# Patient Record
Sex: Female | Born: 1940
Health system: Southern US, Community
[De-identification: ages and names within clinical notes are randomized; demographics above are authoritative.]

## PROBLEM LIST (undated history)

## (undated) DIAGNOSIS — E119 Type 2 diabetes mellitus without complications: Secondary | ICD-10-CM

## (undated) DIAGNOSIS — M170 Bilateral primary osteoarthritis of knee: Secondary | ICD-10-CM

## (undated) DIAGNOSIS — G4730 Sleep apnea, unspecified: Secondary | ICD-10-CM

## (undated) DIAGNOSIS — T8859XA Other complications of anesthesia, initial encounter: Secondary | ICD-10-CM

## (undated) DIAGNOSIS — F32A Depression, unspecified: Secondary | ICD-10-CM

## (undated) DIAGNOSIS — Z801 Family history of malignant neoplasm of trachea, bronchus and lung: Secondary | ICD-10-CM

## (undated) DIAGNOSIS — R609 Edema, unspecified: Secondary | ICD-10-CM

## (undated) DIAGNOSIS — Z8051 Family history of malignant neoplasm of kidney: Secondary | ICD-10-CM

## (undated) DIAGNOSIS — Z800 Family history of malignant neoplasm of digestive organs: Secondary | ICD-10-CM

## (undated) DIAGNOSIS — N189 Chronic kidney disease, unspecified: Secondary | ICD-10-CM

## (undated) HISTORY — DX: Type 2 diabetes mellitus without complications: E11.9

## (undated) HISTORY — DX: Family history of malignant neoplasm of kidney: Z80.51

## (undated) HISTORY — PX: FOOT SURGERY: SHX648

## (undated) HISTORY — DX: Family history of malignant neoplasm of trachea, bronchus and lung: Z80.1

## (undated) HISTORY — PX: ABDOMINAL HYSTERECTOMY: SHX81

## (undated) HISTORY — DX: Family history of malignant neoplasm of digestive organs: Z80.0

## (undated) HISTORY — DX: Bilateral primary osteoarthritis of knee: M17.0

## (undated) HISTORY — DX: Edema, unspecified: R60.9

## (undated) HISTORY — PX: TUBAL LIGATION: SHX77

---

## 2000-01-18 ENCOUNTER — Emergency Department (HOSPITAL_COMMUNITY): Admission: EM | Admit: 2000-01-18 | Discharge: 2000-01-18 | Payer: Self-pay | Admitting: Emergency Medicine

## 2000-01-27 ENCOUNTER — Encounter: Admission: RE | Admit: 2000-01-27 | Discharge: 2000-01-27 | Payer: Self-pay | Admitting: Orthopedic Surgery

## 2000-01-27 ENCOUNTER — Encounter: Payer: Self-pay | Admitting: Orthopedic Surgery

## 2000-02-21 ENCOUNTER — Emergency Department (HOSPITAL_COMMUNITY): Admission: EM | Admit: 2000-02-21 | Discharge: 2000-02-21 | Payer: Self-pay | Admitting: *Deleted

## 2000-07-21 ENCOUNTER — Emergency Department (HOSPITAL_COMMUNITY): Admission: EM | Admit: 2000-07-21 | Discharge: 2000-07-22 | Payer: Self-pay | Admitting: Emergency Medicine

## 2004-10-10 ENCOUNTER — Emergency Department (HOSPITAL_COMMUNITY): Admission: EM | Admit: 2004-10-10 | Discharge: 2004-10-10 | Payer: Self-pay | Admitting: Family Medicine

## 2006-09-25 ENCOUNTER — Emergency Department (HOSPITAL_COMMUNITY): Admission: EM | Admit: 2006-09-25 | Discharge: 2006-09-25 | Payer: Self-pay | Admitting: Emergency Medicine

## 2007-03-27 IMAGING — CT CT HEAD W/O CM
1 of 2 series · 13 of 30 positions shown, 17 images · IV contrast (agent unspecified)
Comparison: None.

CLINICAL DATA: Headache and dizziness. Diabetes. 
 HEAD CT WITHOUT CONTRAST:
TECHNIQUE: Contiguous axial images were obtained from the base of the skull through the vertex according to standard protocol without contrast.

[Series 2: brain · axial · 0.47mm/px · z∈[+142,+263]mm · 13 of 28 slices shown, 17 images]
[im 2/28  brain]
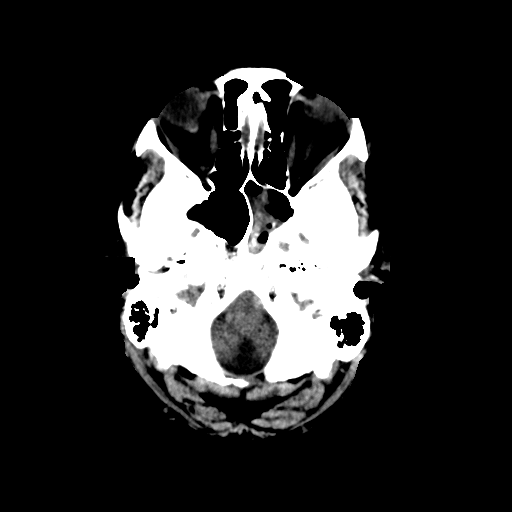
[im 2/28  bone]
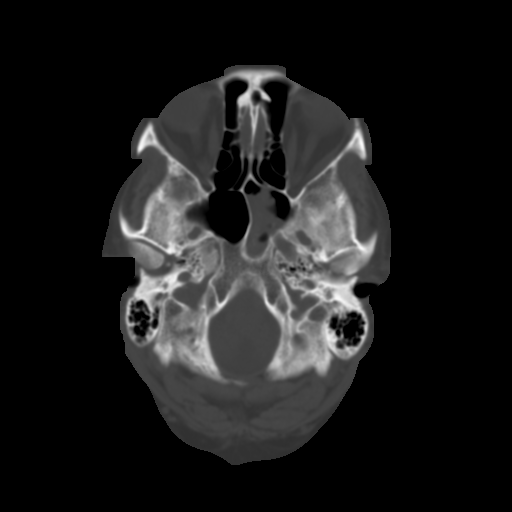
[im 4/28  brain]
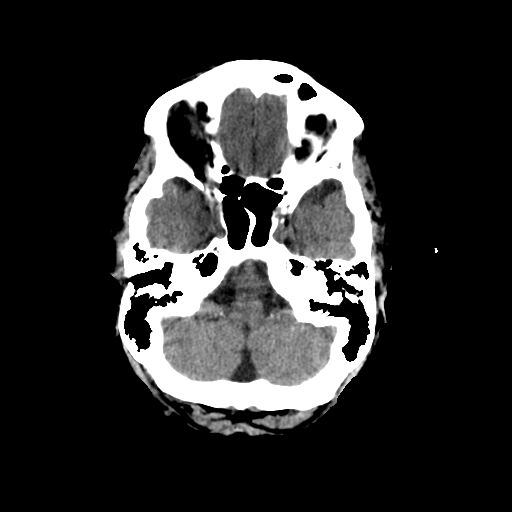
[im 6/28  brain]
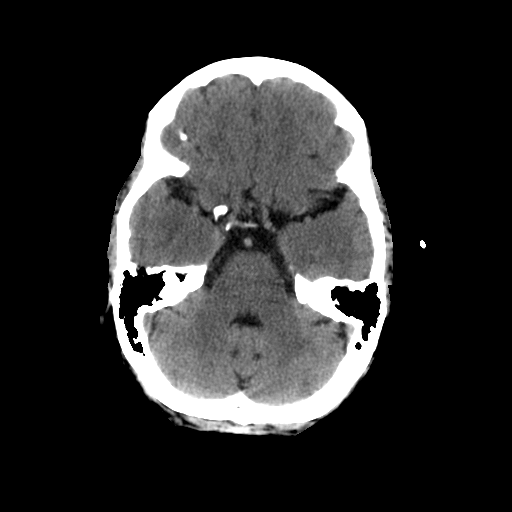
[im 8/28  brain]
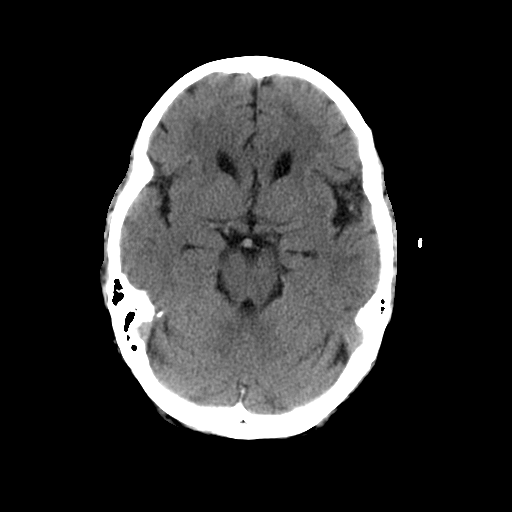
[im 10/28  brain]
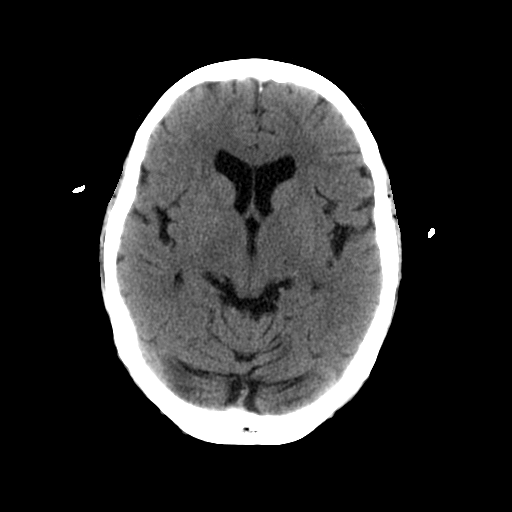
[im 10/28  bone]
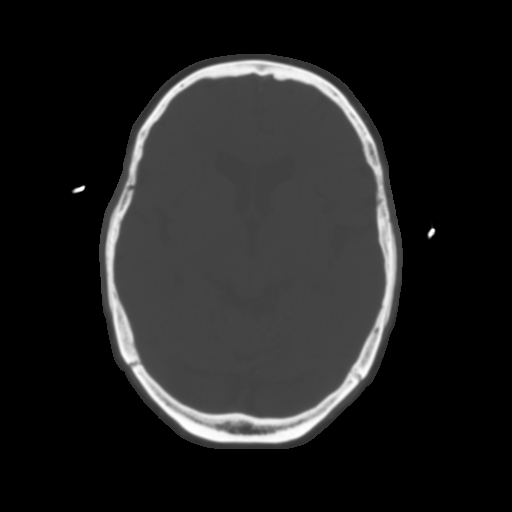
[im 12/28  brain]
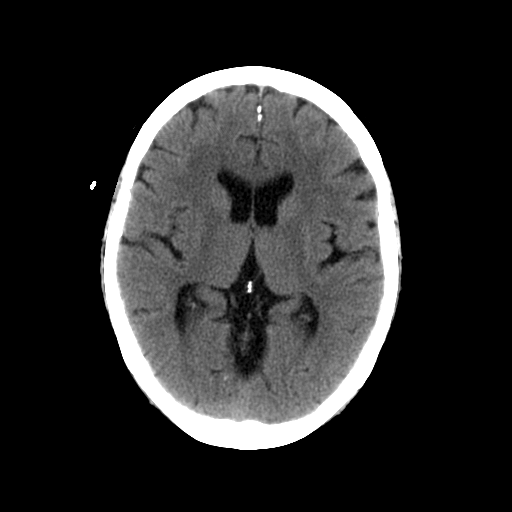
[im 14/28  brain]
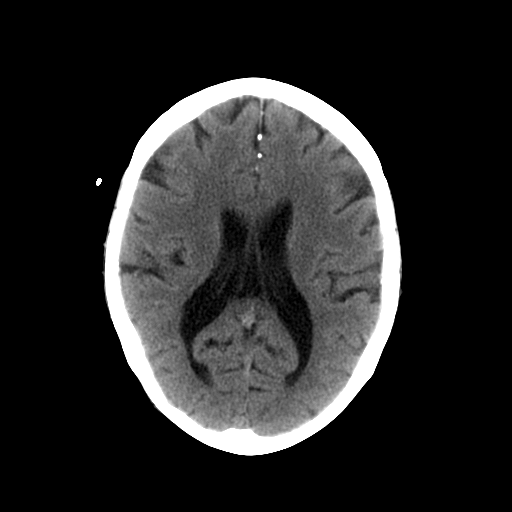
[im 16/28  brain]
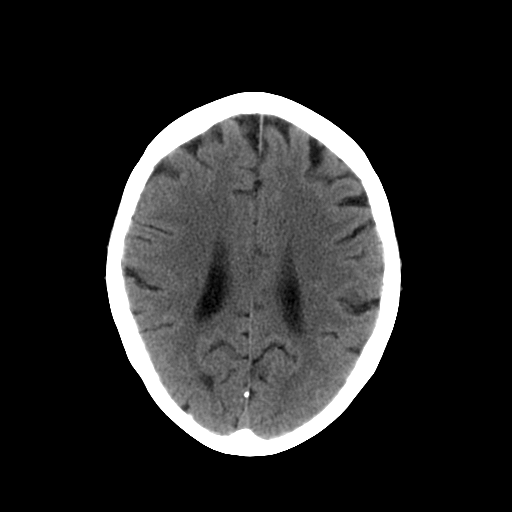
[im 18/28  brain]
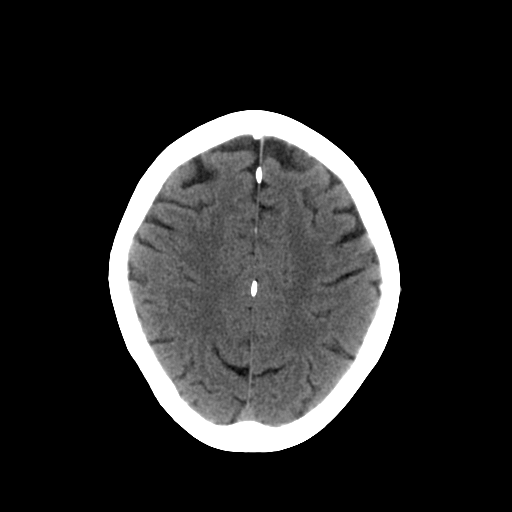
[im 18/28  bone]
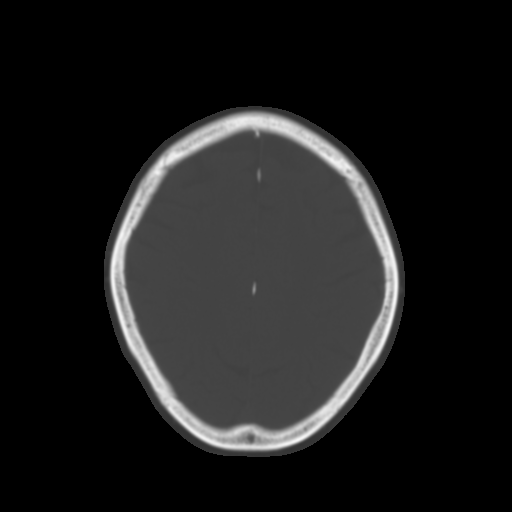
[im 20/28  brain]
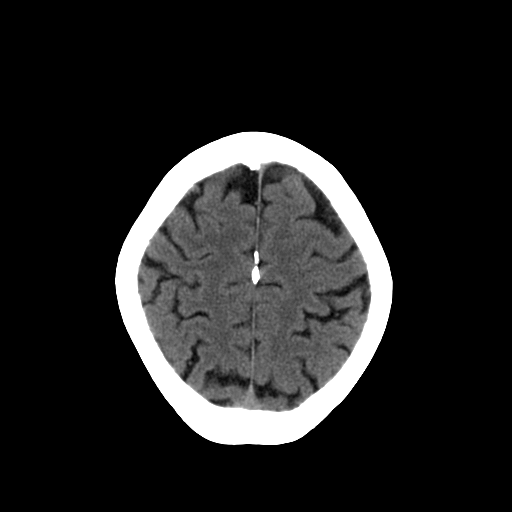
[im 22/28  brain]
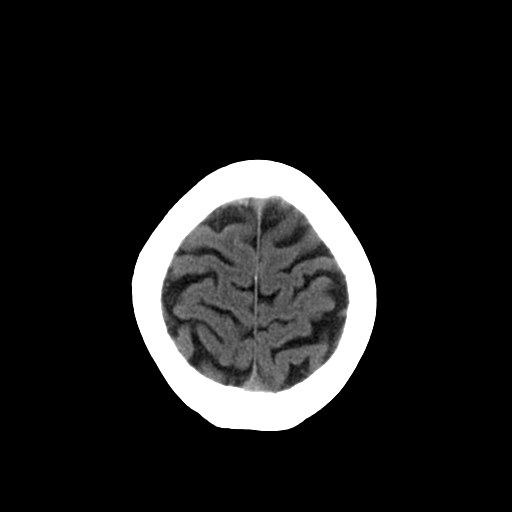
[im 24/28  brain]
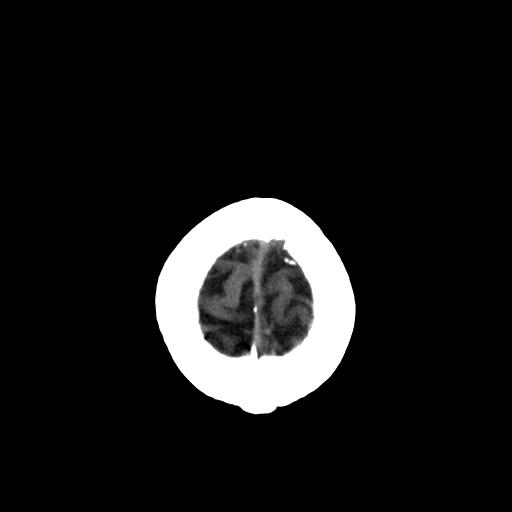
[im 26/28  brain]
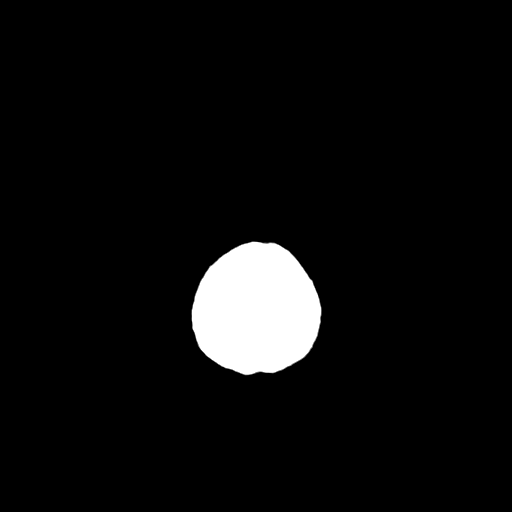
[im 26/28  bone]
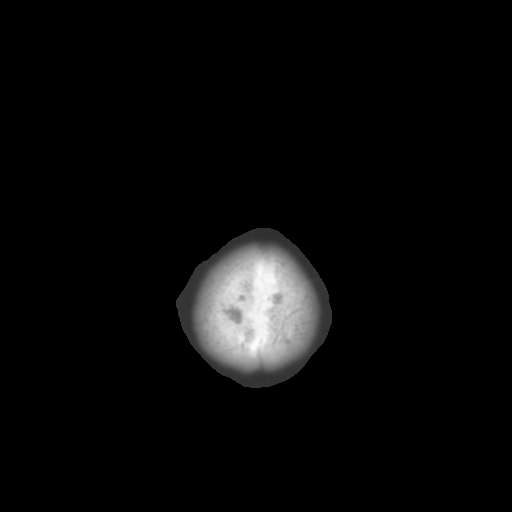

[13 of 30 positions shown; findings below may reference images not displayed]

FINDINGS: There is no evidence of intracranial hemorrhage, brain edema, or other signs of acute infarct.  There is no evidence of intracranial mass or mass effect.  No extraaxial abnormalities are seen. Atrophy is seen mainly involving the cerebellar vermis.  There is no evidence of hydrocephalus. No skull abnormality is identified. Partial opacification of the left sphenoid sinus is noted.
IMPRESSION: 1.  No acute intracranial findings. 
 2.  Chronic sphenoid sinusitis.

## 2008-07-02 ENCOUNTER — Ambulatory Visit: Payer: Self-pay | Admitting: Internal Medicine

## 2008-07-16 ENCOUNTER — Ambulatory Visit: Payer: Self-pay | Admitting: Internal Medicine

## 2009-05-10 DIAGNOSIS — M899 Disorder of bone, unspecified: Secondary | ICD-10-CM | POA: Insufficient documentation

## 2009-05-10 DIAGNOSIS — E1121 Type 2 diabetes mellitus with diabetic nephropathy: Secondary | ICD-10-CM | POA: Insufficient documentation

## 2009-05-10 DIAGNOSIS — K219 Gastro-esophageal reflux disease without esophagitis: Secondary | ICD-10-CM | POA: Insufficient documentation

## 2009-12-10 DIAGNOSIS — G4733 Obstructive sleep apnea (adult) (pediatric): Secondary | ICD-10-CM | POA: Insufficient documentation

## 2010-10-04 ENCOUNTER — Encounter: Payer: Self-pay | Admitting: Internal Medicine

## 2010-10-13 NOTE — Procedures (Signed)
Summary: Colonoscopy   Colonoscopy  Procedure date:  07/16/2008  Findings:      Location:  [facility].    Procedures Next Due Date:    Colonoscopy: 07/2018  Patient Name: Mckenzie Rogers, Mckenzie Rogers MRN:  Procedure Procedures: Colonoscopy CPT: 28413.  Personnel: Endoscopist: Virgen Belland L. Juanda Chance, MD.  Exam Location: Exam performed in Outpatient Clinic. Outpatient  Patient Consent: Procedure, Alternatives, Risks and Benefits discussed, consent obtained, from patient. Consent was obtained by the RN.  Indications  Average Risk Screening Routine.  History  Current Medications: Patient is not currently taking Coumadin.  Pre-Exam Physical: Performed Jul 16, 2008. Entire physical exam was normal.  Comments: Pt. history reviewed/updated, physical exam performed prior to initiation of sedation?yes Exam Exam: Extent of exam reached: Cecum, extent intended: Cecum.  The cecum was identified by appendiceal orifice and IC valve. Duration of exam: time in 8:39 min, out     4:52 min minutes. Colon retroflexion performed. Images taken. ASA Classification: I. Tolerance: good.  Monitoring: Pulse and BP monitoring, Oximetry used. Supplemental O2 given.  Colon Prep Used Miralax for colon prep. Prep results: good.  Sedation Meds: Patient assessed and found to be appropriate for moderate (conscious) sedation. Fentanyl 50 mcg. given IV. Versed 5 mg. given IV.  Findings - NORMAL EXAM: Cecum.  - NORMAL EXAM: Rectum.   Assessment Normal examination.  Comments: no polyps Events  Unplanned Interventions: No intervention was required.  Unplanned Events: There were no complications. Plans Patient Education: Patient given standard instructions for: Patient instructed to get routine colonoscopy every 10 years.  Comments: stool cards every 2-3 years Disposition: After procedure patient sent to recovery. After recovery patient sent home.    cc: Guerry Bruin, MD   This  report was created from the original endoscopy report, which was reviewed and signed by the above listed endoscopist.

## 2010-10-13 NOTE — Miscellaneous (Signed)
Summary: [facility] Previsit/prep  Clinical Lists Changes  Medications: Added new medication of DULCOLAX 5 MG  TBEC (BISACODYL) Day before procedure take 2 at 3pm and 2 at 8pm. - Signed Added new medication of METOCLOPRAMIDE HCL 10 MG  TABS (METOCLOPRAMIDE HCL) As per prep instructions. - Signed Added new medication of MIRALAX   POWD (POLYETHYLENE GLYCOL 3350) As per prep  instructions. - Signed Rx of DULCOLAX 5 MG  TBEC (BISACODYL) Day before procedure take 2 at 3pm and 2 at 8pm.;  #4 x 0;  Signed;  Entered by: Wyona Almas RN;  Authorized by: Hart Carwin MD;  Method used: Electronically to [facility] [address] [phone]*, [address] [place], [place]  [address], Ph: [phone], Fax: [phone] Rx of METOCLOPRAMIDE HCL 10 MG  TABS (METOCLOPRAMIDE HCL) As per prep instructions.;  #2 x 0;  Signed;  Entered by: Wyona Almas RN;  Authorized by: Hart Carwin MD;  Method used: Electronically to [facility] [address] [phone]*, [address], [place], [place]  [address], Ph: [phone], Fax: [phone] Rx of MIRALAX   POWD (POLYETHYLENE GLYCOL 3350) As per prep  instructions.;  #255gm x 0;  Signed;  Entered by: Wyona Almas RN;  Authorized by: Hart Carwin MD;  Method used: Electronically to [facility] [address] [phone]*, [address], [place], [place]  [address], Ph: [phone], Fax: [phone] Allergies: Added new allergy or adverse reaction of * TRANXENE Added new allergy or adverse reaction of PERCODAN Added new allergy or adverse reaction of * ORUDIS Observations: Added new observation of NKA: F (07/02/2008 9:04)    Prescriptions: MIRALAX   POWD (POLYETHYLENE GLYCOL 3350) As per prep  instructions.  #255gm x 0   Entered by:   Wyona Almas RN   Authorized by:   Hart Carwin MD   Signed by:   Wyona Almas RN on 07/02/2008   Method used:   Electronically to        [facility] [address] [phone]* (retail)       [address]  [address]       [place], [place]  [address]       Ph: [phone]       Fax: [phone]   RxID:   [id number] METOCLOPRAMIDE HCL 10 MG  TABS (METOCLOPRAMIDE HCL) As per prep instructions.  #2 x 0   Entered by:   Wyona Almas RN   Authorized by:   Hart Carwin MD   Signed by:   Wyona Almas RN on 07/02/2008   Method used:   Electronically to        [facility] [address] [phone]* (retail)       [address]       [address]       [place], [place]  [address]       Ph: [phone]       Fax: [phone]   RxID:   [id number] DULCOLAX 5 MG  TBEC (BISACODYL) Day before procedure take 2 at 3pm and 2 at 8pm.  #4 x 0   Entered by:   Wyona Almas RN   Authorized by:   Hart Carwin MD   Signed by:   Wyona Almas RN on 07/02/2008   Method used:   Electronically to        [facility] [address] [phone]* (retail)       [address]  [address]       [place], [place]  [address]       Ph: [phone]       Fax: [phone]   RxID:   [id number]   Appended Document: [facility] Previsit/prep    Clinical Lists Changes  Medications: Removed medication of METOCLOPRAMIDE HCL 10 MG  TABS (METOCLOPRAMIDE HCL) As per prep instructions.

## 2013-09-21 ENCOUNTER — Institutional Professional Consult (permissible substitution): Payer: Self-pay | Admitting: Neurology

## 2014-05-03 ENCOUNTER — Encounter: Payer: Self-pay | Admitting: Internal Medicine

## 2015-02-06 DIAGNOSIS — M1712 Unilateral primary osteoarthritis, left knee: Secondary | ICD-10-CM | POA: Diagnosis not present

## 2015-02-06 DIAGNOSIS — M25562 Pain in left knee: Secondary | ICD-10-CM | POA: Diagnosis not present

## 2015-02-06 DIAGNOSIS — M25561 Pain in right knee: Secondary | ICD-10-CM | POA: Diagnosis not present

## 2015-02-06 DIAGNOSIS — M1711 Unilateral primary osteoarthritis, right knee: Secondary | ICD-10-CM | POA: Diagnosis not present

## 2015-03-13 DIAGNOSIS — E669 Obesity, unspecified: Secondary | ICD-10-CM | POA: Diagnosis not present

## 2015-03-13 DIAGNOSIS — J302 Other seasonal allergic rhinitis: Secondary | ICD-10-CM | POA: Diagnosis not present

## 2015-03-13 DIAGNOSIS — M179 Osteoarthritis of knee, unspecified: Secondary | ICD-10-CM | POA: Diagnosis not present

## 2015-03-13 DIAGNOSIS — L219 Seborrheic dermatitis, unspecified: Secondary | ICD-10-CM | POA: Diagnosis not present

## 2015-03-13 DIAGNOSIS — E119 Type 2 diabetes mellitus without complications: Secondary | ICD-10-CM | POA: Diagnosis not present

## 2015-03-13 DIAGNOSIS — M2550 Pain in unspecified joint: Secondary | ICD-10-CM | POA: Diagnosis not present

## 2015-03-13 DIAGNOSIS — E559 Vitamin D deficiency, unspecified: Secondary | ICD-10-CM | POA: Diagnosis not present

## 2015-03-13 DIAGNOSIS — G4733 Obstructive sleep apnea (adult) (pediatric): Secondary | ICD-10-CM | POA: Diagnosis not present

## 2015-06-19 DIAGNOSIS — N390 Urinary tract infection, site not specified: Secondary | ICD-10-CM | POA: Diagnosis not present

## 2015-06-19 DIAGNOSIS — E119 Type 2 diabetes mellitus without complications: Secondary | ICD-10-CM | POA: Diagnosis not present

## 2015-06-19 DIAGNOSIS — E785 Hyperlipidemia, unspecified: Secondary | ICD-10-CM | POA: Diagnosis not present

## 2015-06-19 DIAGNOSIS — R8290 Unspecified abnormal findings in urine: Secondary | ICD-10-CM | POA: Diagnosis not present

## 2015-06-19 DIAGNOSIS — E559 Vitamin D deficiency, unspecified: Secondary | ICD-10-CM | POA: Diagnosis not present

## 2015-06-26 DIAGNOSIS — M179 Osteoarthritis of knee, unspecified: Secondary | ICD-10-CM | POA: Diagnosis not present

## 2015-06-26 DIAGNOSIS — G4733 Obstructive sleep apnea (adult) (pediatric): Secondary | ICD-10-CM | POA: Diagnosis not present

## 2015-06-26 DIAGNOSIS — J302 Other seasonal allergic rhinitis: Secondary | ICD-10-CM | POA: Diagnosis not present

## 2015-06-26 DIAGNOSIS — M2550 Pain in unspecified joint: Secondary | ICD-10-CM | POA: Diagnosis not present

## 2015-06-26 DIAGNOSIS — E669 Obesity, unspecified: Secondary | ICD-10-CM | POA: Diagnosis not present

## 2015-06-26 DIAGNOSIS — Z0000 Encounter for general adult medical examination without abnormal findings: Secondary | ICD-10-CM | POA: Diagnosis not present

## 2015-06-26 DIAGNOSIS — E559 Vitamin D deficiency, unspecified: Secondary | ICD-10-CM | POA: Diagnosis not present

## 2015-06-26 DIAGNOSIS — L219 Seborrheic dermatitis, unspecified: Secondary | ICD-10-CM | POA: Diagnosis not present

## 2016-01-14 DIAGNOSIS — E119 Type 2 diabetes mellitus without complications: Secondary | ICD-10-CM | POA: Diagnosis not present

## 2016-01-14 DIAGNOSIS — K219 Gastro-esophageal reflux disease without esophagitis: Secondary | ICD-10-CM | POA: Diagnosis not present

## 2016-01-14 DIAGNOSIS — E784 Other hyperlipidemia: Secondary | ICD-10-CM | POA: Diagnosis not present

## 2016-01-14 DIAGNOSIS — N3281 Overactive bladder: Secondary | ICD-10-CM | POA: Insufficient documentation

## 2016-01-14 DIAGNOSIS — G4733 Obstructive sleep apnea (adult) (pediatric): Secondary | ICD-10-CM | POA: Diagnosis not present

## 2016-01-14 DIAGNOSIS — D692 Other nonthrombocytopenic purpura: Secondary | ICD-10-CM | POA: Insufficient documentation

## 2016-01-14 DIAGNOSIS — E668 Other obesity: Secondary | ICD-10-CM | POA: Diagnosis not present

## 2016-01-14 DIAGNOSIS — Z6836 Body mass index (BMI) 36.0-36.9, adult: Secondary | ICD-10-CM | POA: Diagnosis not present

## 2016-01-14 DIAGNOSIS — J302 Other seasonal allergic rhinitis: Secondary | ICD-10-CM | POA: Diagnosis not present

## 2016-04-14 DIAGNOSIS — Z111 Encounter for screening for respiratory tuberculosis: Secondary | ICD-10-CM | POA: Diagnosis not present

## 2016-06-22 DIAGNOSIS — E784 Other hyperlipidemia: Secondary | ICD-10-CM | POA: Diagnosis not present

## 2016-06-22 DIAGNOSIS — E119 Type 2 diabetes mellitus without complications: Secondary | ICD-10-CM | POA: Diagnosis not present

## 2016-06-22 DIAGNOSIS — E559 Vitamin D deficiency, unspecified: Secondary | ICD-10-CM | POA: Diagnosis not present

## 2016-07-22 ENCOUNTER — Ambulatory Visit (INDEPENDENT_AMBULATORY_CARE_PROVIDER_SITE_OTHER): Payer: Self-pay | Admitting: Orthopedic Surgery

## 2016-07-22 ENCOUNTER — Encounter (INDEPENDENT_AMBULATORY_CARE_PROVIDER_SITE_OTHER): Payer: Self-pay

## 2016-09-16 DIAGNOSIS — Z0000 Encounter for general adult medical examination without abnormal findings: Secondary | ICD-10-CM | POA: Diagnosis not present

## 2016-09-16 DIAGNOSIS — D692 Other nonthrombocytopenic purpura: Secondary | ICD-10-CM | POA: Diagnosis not present

## 2016-09-16 DIAGNOSIS — E119 Type 2 diabetes mellitus without complications: Secondary | ICD-10-CM | POA: Diagnosis not present

## 2016-09-16 DIAGNOSIS — N3281 Overactive bladder: Secondary | ICD-10-CM | POA: Diagnosis not present

## 2016-09-16 DIAGNOSIS — Z23 Encounter for immunization: Secondary | ICD-10-CM | POA: Diagnosis not present

## 2016-09-16 DIAGNOSIS — M859 Disorder of bone density and structure, unspecified: Secondary | ICD-10-CM | POA: Diagnosis not present

## 2016-09-16 DIAGNOSIS — E668 Other obesity: Secondary | ICD-10-CM | POA: Diagnosis not present

## 2016-09-16 DIAGNOSIS — E559 Vitamin D deficiency, unspecified: Secondary | ICD-10-CM | POA: Diagnosis not present

## 2016-09-16 DIAGNOSIS — Z6836 Body mass index (BMI) 36.0-36.9, adult: Secondary | ICD-10-CM | POA: Diagnosis not present

## 2016-09-16 DIAGNOSIS — M2550 Pain in unspecified joint: Secondary | ICD-10-CM | POA: Diagnosis not present

## 2016-09-16 DIAGNOSIS — M179 Osteoarthritis of knee, unspecified: Secondary | ICD-10-CM | POA: Diagnosis not present

## 2016-09-27 ENCOUNTER — Ambulatory Visit: Payer: Self-pay | Admitting: Podiatry

## 2017-06-09 DIAGNOSIS — H43811 Vitreous degeneration, right eye: Secondary | ICD-10-CM | POA: Diagnosis not present

## 2017-06-09 DIAGNOSIS — H2513 Age-related nuclear cataract, bilateral: Secondary | ICD-10-CM | POA: Diagnosis not present

## 2017-06-09 DIAGNOSIS — E119 Type 2 diabetes mellitus without complications: Secondary | ICD-10-CM | POA: Diagnosis not present

## 2017-09-22 ENCOUNTER — Ambulatory Visit (INDEPENDENT_AMBULATORY_CARE_PROVIDER_SITE_OTHER): Payer: Medicare HMO | Admitting: Orthopedic Surgery

## 2017-09-26 ENCOUNTER — Ambulatory Visit (INDEPENDENT_AMBULATORY_CARE_PROVIDER_SITE_OTHER): Payer: Medicare HMO | Admitting: Orthopedic Surgery

## 2017-10-24 ENCOUNTER — Encounter (INDEPENDENT_AMBULATORY_CARE_PROVIDER_SITE_OTHER): Payer: Self-pay | Admitting: Orthopedic Surgery

## 2017-10-24 ENCOUNTER — Ambulatory Visit (INDEPENDENT_AMBULATORY_CARE_PROVIDER_SITE_OTHER): Payer: Self-pay

## 2017-10-24 ENCOUNTER — Ambulatory Visit (INDEPENDENT_AMBULATORY_CARE_PROVIDER_SITE_OTHER): Payer: Medicare HMO | Admitting: Orthopedic Surgery

## 2017-10-24 ENCOUNTER — Ambulatory Visit (INDEPENDENT_AMBULATORY_CARE_PROVIDER_SITE_OTHER): Payer: Medicare HMO

## 2017-10-24 DIAGNOSIS — M1712 Unilateral primary osteoarthritis, left knee: Secondary | ICD-10-CM | POA: Diagnosis not present

## 2017-10-24 DIAGNOSIS — M1711 Unilateral primary osteoarthritis, right knee: Secondary | ICD-10-CM

## 2017-10-24 DIAGNOSIS — M170 Bilateral primary osteoarthritis of knee: Secondary | ICD-10-CM | POA: Diagnosis not present

## 2017-10-24 MED ORDER — METHYLPREDNISOLONE ACETATE 40 MG/ML IJ SUSP
40.0000 mg | INTRAMUSCULAR | Status: AC | PRN
  Administered 2017-10-24: 40 mg via INTRA_ARTICULAR

## 2017-10-24 MED ORDER — LIDOCAINE HCL 1 % IJ SOLN
5.0000 mL | INTRAMUSCULAR | Status: AC | PRN
  Administered 2017-10-24: 5 mL

## 2017-10-24 MED ORDER — BUPIVACAINE HCL 0.25 % IJ SOLN
4.0000 mL | INTRAMUSCULAR | Status: AC | PRN
  Administered 2017-10-24: 4 mL via INTRA_ARTICULAR

## 2017-10-24 NOTE — Progress Notes (Signed)
Office Visit Note   Patient: Mckenzie Rogers           Date of Birth: 1941-09-02           MRN: [id number] Visit Date: 10/24/2017 Requested by: Gaspar Garbeisovec, Mckenzie Rogers, Mckenzie Rogers [address] [place], [place] [id number] PCP: Mckenzie Rogers, Mckenzie Kohichard Rogers, Mckenzie Rogers  Subjective: Chief Complaint  Patient presents with  . Left Knee - Pain  . Right Knee - Pain    HPI: Patient presents for evaluation of left knee pain.  She reports both knees hurting but the left is worse than the right.'s been flared up for 3 months.  She had similar problem 3 years ago.  She was prescribed meloxicam which is helped.  She denies any interval history of injury or surgery.  On the knees.              ROS: All systems reviewed are negative as they relate to the chief complaint within the history of present illness.  Patient denies  fevers or chills.   Assessment & Plan: Visit Diagnoses:  1. Primary osteoarthritis of both knees     Plan: Impression is bilateral knee arthritis with primarily valgus component on that left knee.  Plan is aspiration and injection of that left knee today.  I think that is going to help her temporarily.  She may be a candidate for knee replacement in the future.  Continue with nonweightbearing quad strengthening exercises.  Follow-up with me as needed.  Follow-Up Instructions: Return if symptoms worsen or fail to improve.   Orders:  Orders Placed This Encounter  Procedures  . XR Knee 1-2 Views Right  . XR Knee 1-2 Views Left   No orders of the defined types were placed in this encounter.     Procedures: Large Joint Inj: L knee on 10/24/2017 5:21 PM Indications: diagnostic evaluation, joint swelling and pain Details: 18 G 1.5 in needle, superolateral approach  Arthrogram: No  Medications: 5 mL lidocaine 1 %; 40 mg methylPREDNISolone acetate 40 MG/ML; 4 mL bupivacaine 0.25 % Aspirate: 5 mL yellow Outcome: tolerated well, no immediate complications Procedure, treatment alternatives, risks and  benefits explained, specific risks discussed. Consent was given by the patient. Immediately prior to procedure a time out was called to verify the correct patient, procedure, equipment, support staff and site/side marked as required. Patient was prepped and draped in the usual sterile fashion.       Clinical Data: No additional findings.  Objective: Vital Signs: There were no vitals taken for this visit.  Physical Exam:   Constitutional: Patient appears well-developed HEENT:  Head: Normocephalic Eyes:EOM are normal Neck: Normal range of motion Cardiovascular: Normal rate Pulmonary/chest: Effort normal Neurologic: Patient is alert Skin: Skin is warm Psychiatric: Patient has normal mood and affect    Ortho Exam: Orthopedic exam demonstrates valgus alignment left lower extremity pedal pulses palpable extensor mechanism is intact no groin pain with internal/external rotation of the leg patient has full extension and flexion to about 120.  Lateral greater than medial joint line tenderness.  Extensor mechanism is intact.  Specialty Comments:  No specialty comments available.  Imaging: Xr Knee 1-2 Views Left  Result Date: 10/24/2017 AP lateral left knee reviewed.  Tricompartmental arthritis is present worse in the lateral compartment.  Spurring is present in this area.  Bone-on-bone changes are noted.  Posterior spurring also present.  Patella well centered over the femur  Xr Knee 1-2 Views Right  Result Date: 10/24/2017 AP lateral right knee  reviewed.  End-stage tricompartmental arthritis is present worse on the medial side.  Spurring is noted.  There is no fracture or dislocation.  Patella centered over the femur    PMFS History: There are no active problems to display for this patient.  History reviewed. No pertinent past medical history.  History reviewed. No pertinent family history.  History reviewed. No pertinent surgical history. Social History   Occupational  History  . Not on file  Tobacco Use  . Smoking status: Never Smoker  . Smokeless tobacco: Never Used  Substance and Sexual Activity  . Alcohol use: Not on file  . Drug use: Not on file  . Sexual activity: Not on file

## 2017-11-25 DIAGNOSIS — R82998 Other abnormal findings in urine: Secondary | ICD-10-CM | POA: Diagnosis not present

## 2017-11-25 DIAGNOSIS — E119 Type 2 diabetes mellitus without complications: Secondary | ICD-10-CM | POA: Diagnosis not present

## 2017-11-28 DIAGNOSIS — E7849 Other hyperlipidemia: Secondary | ICD-10-CM | POA: Diagnosis not present

## 2017-11-28 DIAGNOSIS — E119 Type 2 diabetes mellitus without complications: Secondary | ICD-10-CM | POA: Diagnosis not present

## 2017-11-28 DIAGNOSIS — E559 Vitamin D deficiency, unspecified: Secondary | ICD-10-CM | POA: Diagnosis not present

## 2017-12-02 DIAGNOSIS — E1122 Type 2 diabetes mellitus with diabetic chronic kidney disease: Secondary | ICD-10-CM | POA: Diagnosis not present

## 2017-12-02 DIAGNOSIS — M859 Disorder of bone density and structure, unspecified: Secondary | ICD-10-CM | POA: Diagnosis not present

## 2017-12-02 DIAGNOSIS — M179 Osteoarthritis of knee, unspecified: Secondary | ICD-10-CM | POA: Diagnosis not present

## 2017-12-02 DIAGNOSIS — Z0000 Encounter for general adult medical examination without abnormal findings: Secondary | ICD-10-CM | POA: Diagnosis not present

## 2017-12-02 DIAGNOSIS — E7849 Other hyperlipidemia: Secondary | ICD-10-CM | POA: Diagnosis not present

## 2017-12-02 DIAGNOSIS — R808 Other proteinuria: Secondary | ICD-10-CM | POA: Diagnosis not present

## 2017-12-02 DIAGNOSIS — N390 Urinary tract infection, site not specified: Secondary | ICD-10-CM | POA: Diagnosis not present

## 2017-12-02 DIAGNOSIS — G4733 Obstructive sleep apnea (adult) (pediatric): Secondary | ICD-10-CM | POA: Diagnosis not present

## 2017-12-02 DIAGNOSIS — E559 Vitamin D deficiency, unspecified: Secondary | ICD-10-CM | POA: Diagnosis not present

## 2018-01-18 ENCOUNTER — Ambulatory Visit (INDEPENDENT_AMBULATORY_CARE_PROVIDER_SITE_OTHER): Payer: Medicare HMO | Admitting: Orthopedic Surgery

## 2018-01-24 ENCOUNTER — Encounter (HOSPITAL_COMMUNITY): Payer: Self-pay | Admitting: Emergency Medicine

## 2018-01-24 ENCOUNTER — Emergency Department (HOSPITAL_COMMUNITY): Payer: Medicare HMO

## 2018-01-24 ENCOUNTER — Other Ambulatory Visit: Payer: Self-pay

## 2018-01-24 ENCOUNTER — Emergency Department (HOSPITAL_COMMUNITY)
Admission: EM | Admit: 2018-01-24 | Discharge: 2018-01-24 | Disposition: A | Discharge status: Home / Self Care | Payer: Medicare HMO | Attending: Emergency Medicine | Admitting: Emergency Medicine

## 2018-01-24 DIAGNOSIS — J209 Acute bronchitis, unspecified: Secondary | ICD-10-CM | POA: Diagnosis not present

## 2018-01-24 DIAGNOSIS — Z79899 Other long term (current) drug therapy: Secondary | ICD-10-CM | POA: Insufficient documentation

## 2018-01-24 DIAGNOSIS — R05 Cough: Secondary | ICD-10-CM | POA: Diagnosis not present

## 2018-01-24 DIAGNOSIS — J3489 Other specified disorders of nose and nasal sinuses: Secondary | ICD-10-CM | POA: Diagnosis not present

## 2018-01-24 MED ORDER — BENZONATATE 100 MG PO CAPS: 100.0000 mg | ORAL_CAPSULE | Freq: Three times a day (TID) | ORAL | 0 refills | Status: DC

## 2018-01-24 MED ORDER — ALBUTEROL SULFATE HFA 108 (90 BASE) MCG/ACT IN AERS: 1.0000 | INHALATION_SPRAY | Freq: Four times a day (QID) | RESPIRATORY_TRACT | 0 refills | Status: AC | PRN

## 2018-01-24 NOTE — ED Triage Notes (Signed)
Pt reports productive cough with white sputum since Sunday, also endorses sore throat, denies cp or sob.

## 2018-01-24 NOTE — ED Provider Notes (Signed)
[facility] EMERGENCY DEPARTMENT Provider Note   CSN: [id number] Arrival date & time: 01/24/18  1039     History   Chief Complaint Chief Complaint  Patient presents with  . Cough    HPI Mckenzie Rogers is a 77 y.o. female.  HPI   Patient is a 77 year old female with history of hyperlipidemia who presents the ED today to be evaluated for a productive cough which is been present for the last 4 days.  Patient also reports associated sore throat, rhinorrhea.  Denies bilateral ear pain or nasal congestion.  Denies shortness of breath.  She does report some midline chest heaviness when she coughs and for short period of time after she coughs.  Denies that she has any pain currently.  States the pain is worse when she presses on it.  It does not radiate it is not associated with exertion, diaphoresis, nausea.  States that is a mild discomfort.  She also reports some abdominal muscle pain when she coughs.  No abdominal pain when she does not cough.  Had one episode of vomiting yesterday after taking Mucinex.  Has had no continued episodes of nausea no vomiting.  No fevers.  No urinary symptoms.  Denies that she has been around anyone with similar symptoms.  He has no history of tobacco use. No calf pain, swelling, or redness.  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  History reviewed. No pertinent past medical history.  There are no active problems to display for this patient.   History reviewed. No pertinent surgical history.   OB History   None      Home Medications    Prior to Admission medications   Medication Sig Start Date End Date Taking? Authorizing Provider  albuterol (PROVENTIL HFA;VENTOLIN HFA) 108 (90 Base) MCG/ACT inhaler Inhale 1-2 puffs into the lungs every 6 (six) hours as needed for wheezing or shortness of breath. 01/24/18   Luca Dyar S, PA-C  benzonatate (TESSALON) 100 MG  capsule Take 1 capsule (100 mg total) by mouth every 8 (eight) hours. 01/24/18   Trenice Mesa S, PA-C  meloxicam (MOBIC) 15 MG tablet Take 15 mg by mouth daily.    [provider]    Family History No family history on file.  Social History Social History   Tobacco Use  . Smoking status: Never Smoker  . Smokeless tobacco: Never Used  Substance Use Topics  . Alcohol use: Not on file  . Drug use: Not on file     Allergies   Oxycodone-aspirin   Review of Systems Review of Systems  Constitutional: Negative for fever.  HENT: Positive for rhinorrhea and sore throat. Negative for congestion, ear pain and trouble swallowing.   Eyes: Negative for pain and visual disturbance.  Respiratory: Positive for cough. Negative for shortness of breath.   Cardiovascular: Negative for leg swelling.       Midline chest heaviness  Gastrointestinal: Negative for abdominal pain, constipation, diarrhea, nausea and vomiting.  Genitourinary: Negative for dysuria and hematuria.  Musculoskeletal: Negative for back pain.  Skin: Negative for rash.  Neurological: Negative for dizziness, weakness, light-headedness and numbness.  All other systems reviewed and are negative.   Physical Exam Updated Vital Signs BP 128/72   Pulse 76   Temp 98.8 F (37.1 C) (Oral)   Resp 18   LMP  (LMP Unknown)   SpO2 100%   Physical Exam  Constitutional: She appears well-developed and well-nourished. No distress.  HENT:  Head: Normocephalic and atraumatic.  bilat TMs without erythema or effusion. No pharyngeal erythema. No tonsillar exudate or edema. Tolerating secretions. No evidence of PTA.  Eyes: Pupils are equal, round, and reactive to light. Conjunctivae and EOM are normal.  Neck: Neck supple.  Cardiovascular: Normal rate, regular rhythm, normal heart sounds and intact distal pulses.  No murmur heard. Pulmonary/Chest: Effort normal and breath sounds normal. No stridor. No respiratory distress. She  has no wheezes. She has no rales.  Midline lower chest ttp which reproduces pain. No crepitus.   Abdominal: Soft. Bowel sounds are normal. She exhibits no distension. There is no tenderness. There is no guarding.  Musculoskeletal: She exhibits no edema.  No calf erythema, swelling. Mild ttp bilaterally that is diffuse and along gastrocnemius muscles. No asymmetric ttp.  Lymphadenopathy:    She has no cervical adenopathy.  Neurological: She is alert.  Skin: Skin is warm and dry. Capillary refill takes less than 2 seconds.  Psychiatric: She has a normal mood and affect.  Nursing note and vitals reviewed.  ED Treatments / Results  Labs (all labs ordered are listed, but only abnormal results are displayed) Labs Reviewed - No data to display  EKG EKG Interpretation  Date/Time:  Tuesday Jan 24 2018 13:08:36 EDT Ventricular Rate:  68 PR Interval:    QRS Duration: 75 QT Interval:  362 QTC Calculation: 385 R Axis:   35 Text Interpretation:  Sinus rhythm Borderline short PR interval Low voltage, precordial leads no significant change since 2008 Confirmed by Pricilla Loveless [phone]) on 01/24/2018 1:20:52 PM   Radiology Dg Chest 2 View  Result Date: 01/24/2018 CLINICAL DATA:  Two days of productive cough and chest pressure like discomfort; no known cardiac issues. EXAM: CHEST - 2 VIEW COMPARISON:  Chest x-ray of October 10, 2004 FINDINGS: The lungs are adequately inflated. There is no focal infiltrate. The interstitial markings are mildly prominent though stable. There is no pleural effusion. The heart and pulmonary vascularity are normal. The mediastinum is normal in width. There is chronic dextrocurvature of the thoracolumbar spine. IMPRESSION: Mild chronic bronchitic changes, stable. No acute cardiopulmonary abnormality. Electronically Signed   By: David  [place] M.D.   On: 01/24/2018 11:40    Procedures Procedures (including critical care time)  Medications Ordered in ED Medications - No  data to display   Initial Impression / Assessment and Plan / ED Course  I have reviewed the triage vital signs and the nursing notes.  Pertinent labs & imaging results that were available during my care of the patient were reviewed by me and considered in my medical decision making (see chart for details).    Discussed pt presentation and exam findings with Dr. Criss Alvine, who recommended ordering and ECG. He reviewed ECG and is in agreement that further cardiac workup is not necessary as patients sxs seem consistent with chest wall pain secondary to cough/bronchitis for the last 4 days.   Final Clinical Impressions(s) / ED Diagnoses   Final diagnoses:  Acute bronchitis, unspecified organism   Patient presented with URI symptoms x4 days.  Had describes some chest pressure with cough that is not currently present.  Her vital signs are stable and she is afebrile at this time.  Her cardiopulmonary exam is within normal limits.  She is in no acute distress and nontoxic-appearing.  Pt CXR negative for acute infiltrate. ECG without ischemic changes. Consistent with prior. NSR. Normal heart rate.  Do not suspect ACS or AAA.  Doubt PE.  Patients symptoms are consistent with viral bronchitis. Discussed that antibiotics are not indicated for viral infections. Pt will be discharged with symptomatic treatment.  Verbalizes understanding and is agreeable with plan. Pt is hemodynamically stable & in NAD prior to dc.  States she has a PCP and she will follow-up this week for reevaluation.  Strict return precautions were discussed the patient does agree to follow-up if she experiences any new or worsening signs or symptoms.  All questions answered and patient agrees with the plan for discharge.  ED Discharge Orders        Ordered    albuterol (PROVENTIL HFA;VENTOLIN HFA) 108 (90 Base) MCG/ACT inhaler  Every 6 hours PRN     01/24/18 1328    benzonatate (TESSALON) 100 MG capsule  Every 8 hours     01/24/18 1328         Shenee Wignall S, PA-C 01/24/18 1334    Pricilla Loveless, MD 01/25/18 [phone]

## 2018-01-24 NOTE — Discharge Instructions (Signed)
You are given a prescription for an albuterol inhaler which she can use every 4-6 hours as needed for shortness of breath or cough.  You are also given a prescription for Tessalon which is a cough medication.  You may take 1 capsule every 8 hours as needed for your cough.    I have prescribed a new medication for you today. It is important that when you pick the prescription up you discuss the potential interactions of this medication with other medications you are taking, including over the counter medications, with the pharmacists.   This new medication has potential side effects. Be sure to contact your primary care provider or return to the emergency department if you are experiencing new symptoms that you are unable to tolerate after starting the medication. You need to receive medical evaluation immediately if you start to experience blistering of the skin, rash, swelling, or difficulty breathing as these signs could indicate a more serious medication side effect.   Over the next several days you should rest as much as possible, and drink more fluids than usual. Liquids will help thin and loosen mucus so you can cough it up. Liquids will also help prevent dehydration. Using a cool mist humidifier or a vaporizer to increase air moisture in your home can also make it easier for you to breathe and help decrease your cough.  To help soothe a sore throat gargle with warm salt water.  Make salt water by dissolving  teaspoon salt in 1 cup warm water. You may also use throat lozenges and over the counter sore throat spray.  Please follow up with your primary care provider within 2-3 days for re-evaluation of your symptoms. Please return to the emergency department for any persistent fevers, worsening sore throat/hoarse voice, inability to swallow, persistent vomiting, chest pain, shortness of breath, coughing up blood, or any new or worsening symptoms.

## 2018-02-01 DIAGNOSIS — Z6834 Body mass index (BMI) 34.0-34.9, adult: Secondary | ICD-10-CM | POA: Diagnosis not present

## 2018-02-01 DIAGNOSIS — J209 Acute bronchitis, unspecified: Secondary | ICD-10-CM | POA: Diagnosis not present

## 2018-02-01 DIAGNOSIS — J302 Other seasonal allergic rhinitis: Secondary | ICD-10-CM | POA: Diagnosis not present

## 2018-02-15 ENCOUNTER — Ambulatory Visit (INDEPENDENT_AMBULATORY_CARE_PROVIDER_SITE_OTHER): Payer: Medicare HMO | Admitting: Orthopedic Surgery

## 2018-02-23 ENCOUNTER — Ambulatory Visit (INDEPENDENT_AMBULATORY_CARE_PROVIDER_SITE_OTHER): Payer: Medicare HMO | Admitting: Orthopedic Surgery

## 2018-04-10 ENCOUNTER — Ambulatory Visit (INDEPENDENT_AMBULATORY_CARE_PROVIDER_SITE_OTHER): Payer: Medicare HMO | Admitting: Orthopedic Surgery

## 2018-06-08 DIAGNOSIS — Z23 Encounter for immunization: Secondary | ICD-10-CM | POA: Diagnosis not present

## 2018-07-26 IMAGING — CR DG CHEST 2V
2 series · 2 of 2 positions shown · non-contrast
Comparison: Chest x-ray of October 10, 2004

CLINICAL DATA: Two days of productive cough and chest pressure like
discomfort; no known cardiac issues.

EXAM:
CHEST - 2 VIEW

[chest pa]
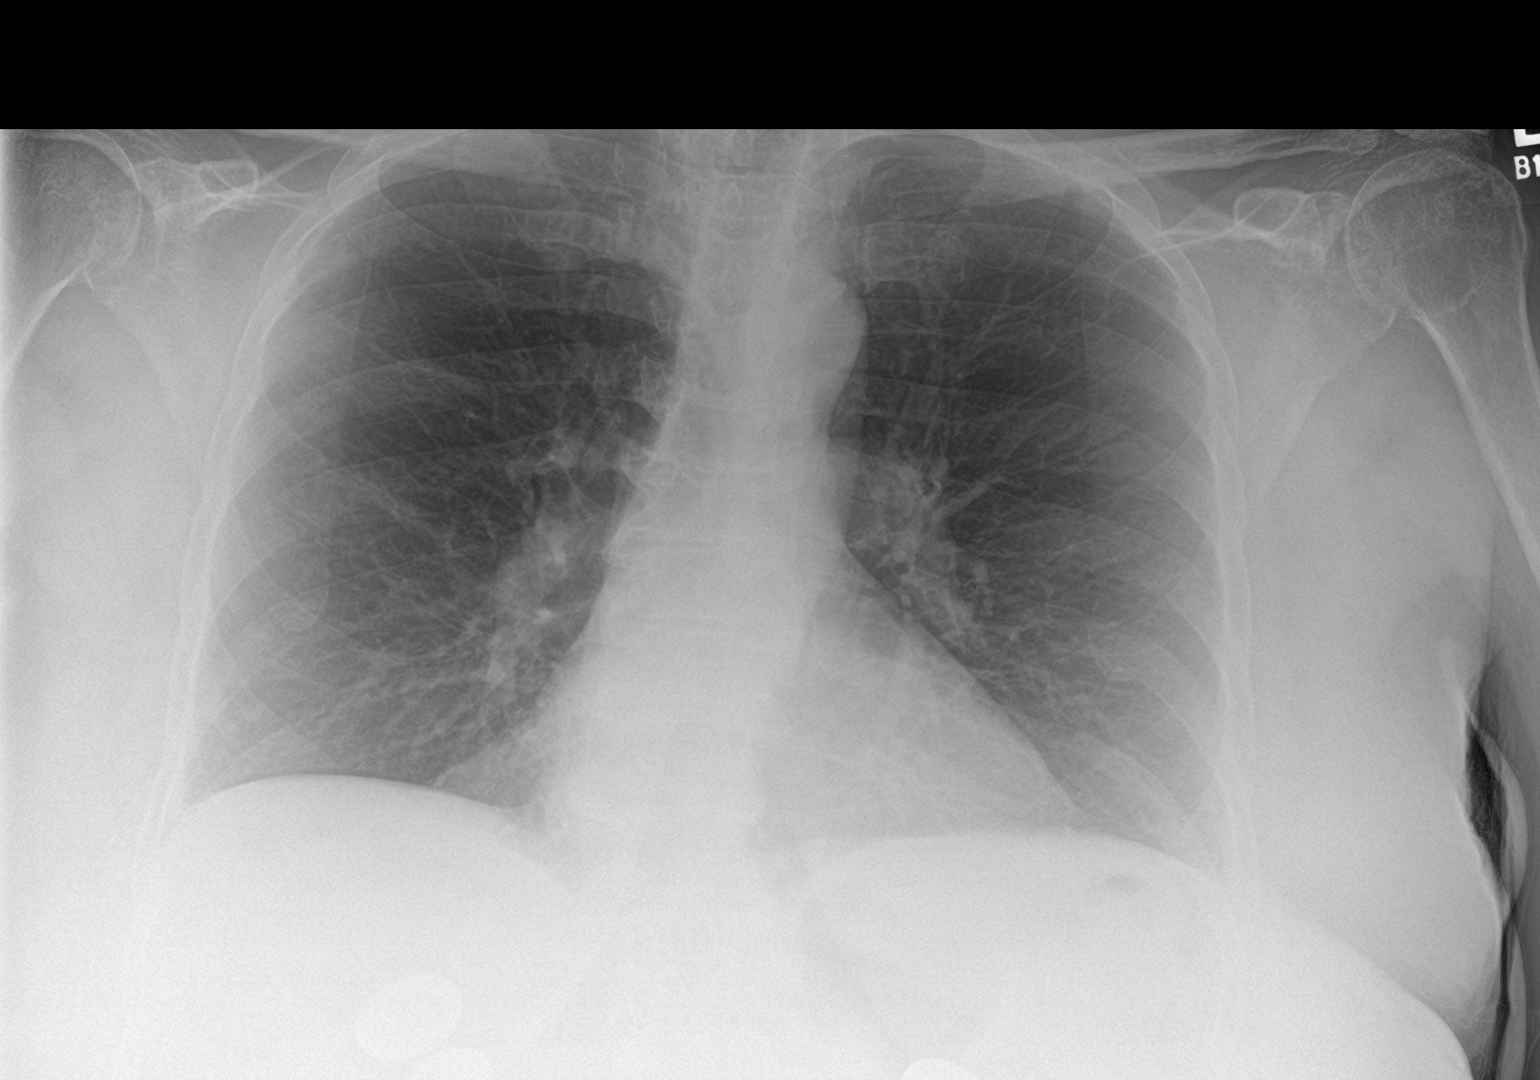

[chest lat]
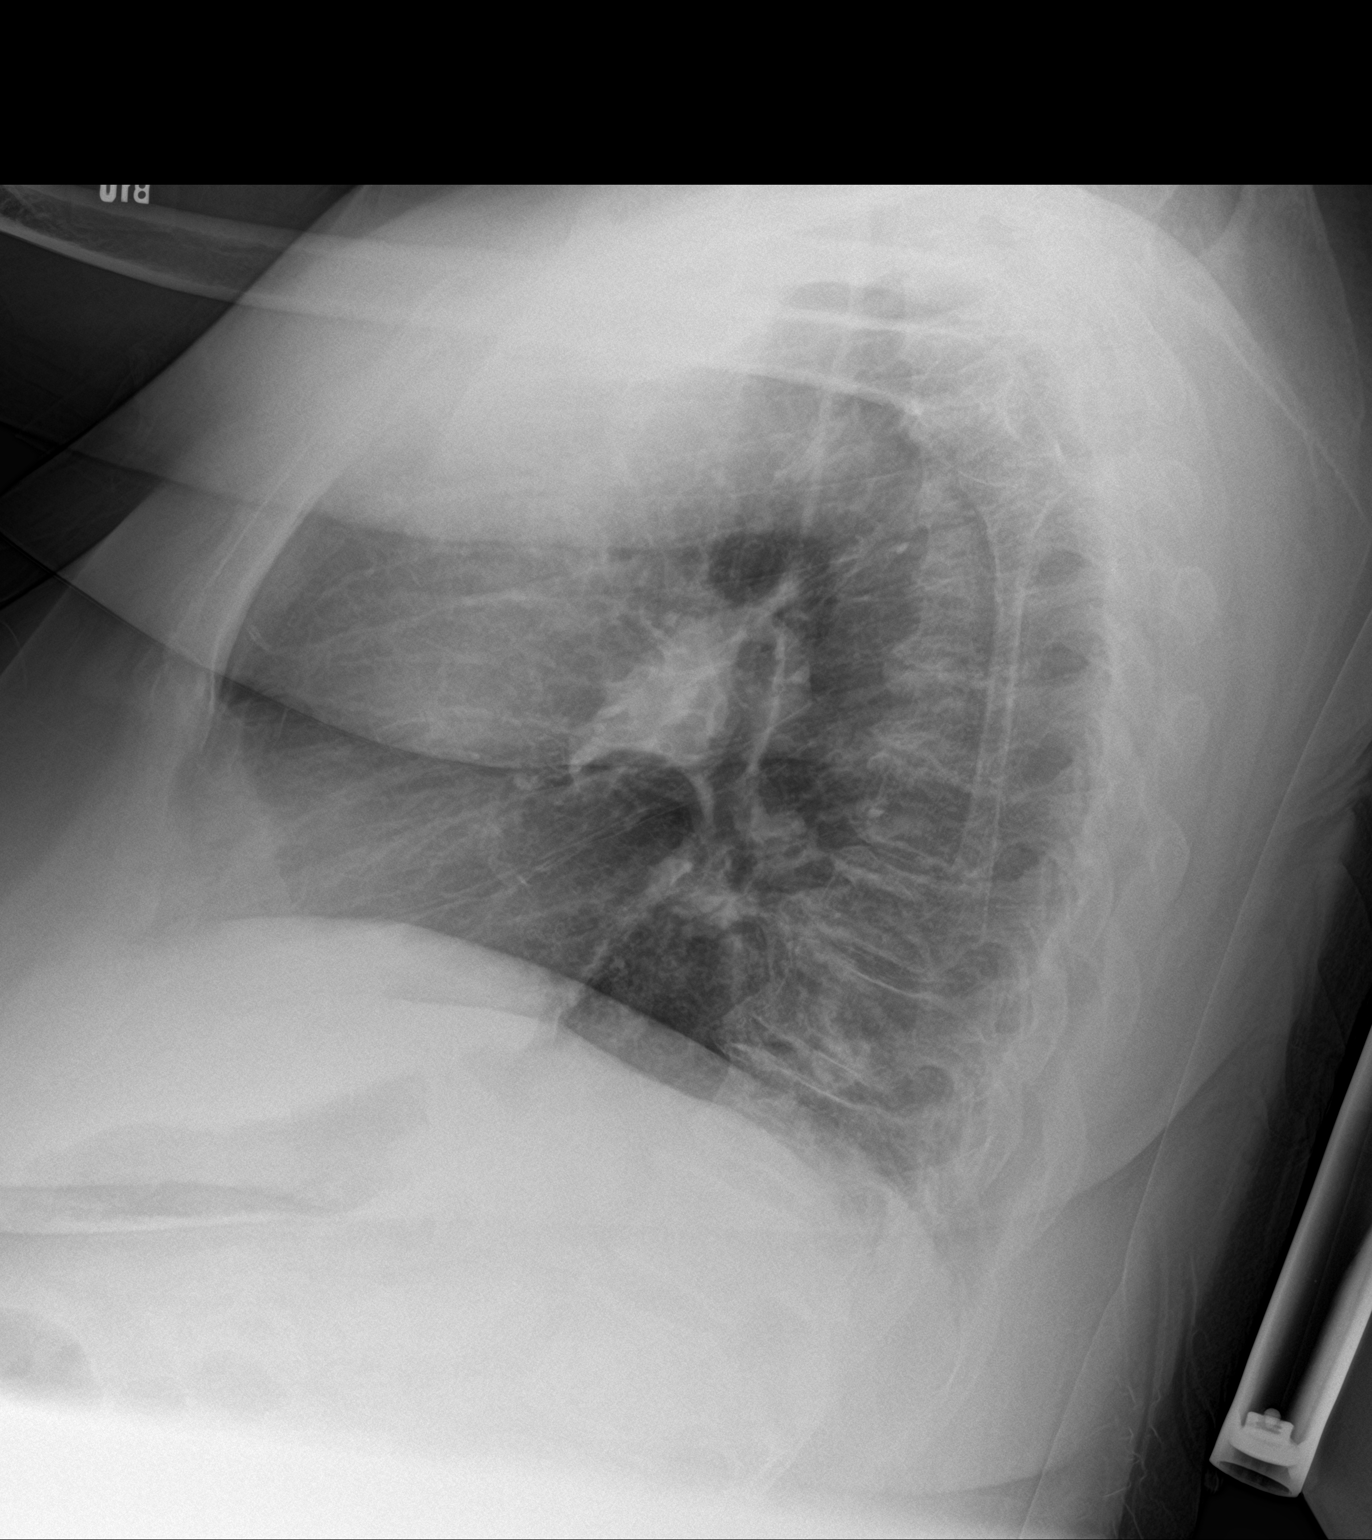

[2 of 2 positions shown; findings below may reference images not displayed]

FINDINGS: The lungs are adequately inflated. There is no focal infiltrate. The
interstitial markings are mildly prominent though stable. There is
no pleural effusion. The heart and pulmonary vascularity are normal.
The mediastinum is normal in width. There is chronic dextrocurvature
of the thoracolumbar spine.
IMPRESSION: Mild chronic bronchitic changes, stable. No acute cardiopulmonary
abnormality.

## 2018-08-16 ENCOUNTER — Ambulatory Visit (INDEPENDENT_AMBULATORY_CARE_PROVIDER_SITE_OTHER): Payer: Self-pay

## 2018-08-16 ENCOUNTER — Ambulatory Visit (INDEPENDENT_AMBULATORY_CARE_PROVIDER_SITE_OTHER): Payer: Medicare HMO | Admitting: Orthopedic Surgery

## 2018-08-16 ENCOUNTER — Encounter (INDEPENDENT_AMBULATORY_CARE_PROVIDER_SITE_OTHER): Payer: Self-pay | Admitting: Orthopedic Surgery

## 2018-08-16 DIAGNOSIS — M170 Bilateral primary osteoarthritis of knee: Secondary | ICD-10-CM

## 2018-08-16 MED ORDER — BUPIVACAINE HCL 0.25 % IJ SOLN
4.0000 mL | INTRAMUSCULAR | Status: AC | PRN
  Administered 2018-08-16: 4 mL via INTRA_ARTICULAR

## 2018-08-16 MED ORDER — LIDOCAINE HCL 1 % IJ SOLN
5.0000 mL | INTRAMUSCULAR | Status: AC | PRN
  Administered 2018-08-16: 5 mL

## 2018-08-16 MED ORDER — METHYLPREDNISOLONE ACETATE 40 MG/ML IJ SUSP
40.0000 mg | INTRAMUSCULAR | Status: AC | PRN
  Administered 2018-08-16: 40 mg via INTRA_ARTICULAR

## 2018-08-16 NOTE — Progress Notes (Signed)
Office Visit Note   Patient: Mckenzie Rogers           Date of Birth: September 18, 1940           MRN: [id number] Visit Date: 08/16/2018 Requested by: Gaspar Garbeisovec, Richard W, MD [address] [place], [place] [id number] PCP: Wylene Simmerisovec, Adelfa Kohichard W, MD  Subjective: Chief Complaint  Patient presents with  . Right Knee - Follow-up  . Left Knee - Follow-up  . Right Hand - Pain  . Left Hand - Pain    HPI: Patient presents for evaluation of bilateral knee pain.  She reports bilateral knee pain left worse than right.  Stiffness in both knees.  Takes Mobic.  Her sugar from sitting to standing.  Denies any mechanical symptoms.              ROS: All systems reviewed are negative as they relate to the chief complaint within the history of present illness.  Patient denies  fevers or chills.   Assessment & Plan: Visit Diagnoses:  1. Primary osteoarthritis of both knees     Plan: Impression is bilateral knee pain with left knee worse than right arthritis.  We will inject the knee today.  She has a known history of knee arthritis.  She may need knee replacement sometime in the future but will let her decide on that.  Follow-up with me as needed.  Follow-Up Instructions: Return if symptoms worsen or fail to improve.   Orders:  No orders of the defined types were placed in this encounter.  No orders of the defined types were placed in this encounter.     Procedures: Large Joint Inj: L knee on 08/16/2018 10:32 PM Indications: diagnostic evaluation, joint swelling and pain Details: 18 G 1.5 in needle, superolateral approach  Arthrogram: No  Medications: 5 mL lidocaine 1 %; 40 mg methylPREDNISolone acetate 40 MG/ML; 4 mL bupivacaine 0.25 % Outcome: tolerated well, no immediate complications Procedure, treatment alternatives, risks and benefits explained, specific risks discussed. Consent was given by the patient. Immediately prior to procedure a time out was called to verify the correct patient,  procedure, equipment, support staff and site/side marked as required. Patient was prepped and draped in the usual sterile fashion.       Clinical Data: No additional findings.  Objective: Vital Signs: There were no vitals taken for this visit.  Physical Exam:   Constitutional: Patient appears well-developed HEENT:  Head: Normocephalic Eyes:EOM are normal Neck: Normal range of motion Cardiovascular: Normal rate Pulmonary/chest: Effort normal Neurologic: Patient is alert Skin: Skin is warm Psychiatric: Patient has normal mood and affect    Ortho Exam: Ortho exam demonstrates full active and passive range of motion of the ankles and hips.  Left knee demonstrates mild effusion with patellofemoral crepitus and lateral greater than medial joint line tenderness.  Pedal pulses palpable.  No masses lymphadenopathy or skin changes noted in that knee region.  Specialty Comments:  No specialty comments available.  Imaging: No results found.   PMFS History: There are no active problems to display for this patient.  History reviewed. No pertinent past medical history.  History reviewed. No pertinent family history.  History reviewed. No pertinent surgical history. Social History   Occupational History  . Not on file  Tobacco Use  . Smoking status: Never Smoker  . Smokeless tobacco: Never Used  Substance and Sexual Activity  . Alcohol use: Not on file  . Drug use: Not on file  . Sexual activity: Not  on file

## 2018-08-17 DIAGNOSIS — E7849 Other hyperlipidemia: Secondary | ICD-10-CM | POA: Diagnosis not present

## 2018-08-17 DIAGNOSIS — N182 Chronic kidney disease, stage 2 (mild): Secondary | ICD-10-CM | POA: Diagnosis not present

## 2018-08-17 DIAGNOSIS — E1122 Type 2 diabetes mellitus with diabetic chronic kidney disease: Secondary | ICD-10-CM | POA: Diagnosis not present

## 2018-08-17 DIAGNOSIS — G4733 Obstructive sleep apnea (adult) (pediatric): Secondary | ICD-10-CM | POA: Diagnosis not present

## 2018-08-17 DIAGNOSIS — K219 Gastro-esophageal reflux disease without esophagitis: Secondary | ICD-10-CM | POA: Diagnosis not present

## 2018-08-17 DIAGNOSIS — M859 Disorder of bone density and structure, unspecified: Secondary | ICD-10-CM | POA: Diagnosis not present

## 2018-08-17 DIAGNOSIS — R808 Other proteinuria: Secondary | ICD-10-CM | POA: Diagnosis not present

## 2018-08-17 DIAGNOSIS — M179 Osteoarthritis of knee, unspecified: Secondary | ICD-10-CM | POA: Diagnosis not present

## 2018-08-17 DIAGNOSIS — N3281 Overactive bladder: Secondary | ICD-10-CM | POA: Diagnosis not present

## 2018-08-24 DIAGNOSIS — E1122 Type 2 diabetes mellitus with diabetic chronic kidney disease: Secondary | ICD-10-CM | POA: Diagnosis not present

## 2018-08-24 DIAGNOSIS — N183 Chronic kidney disease, stage 3 (moderate): Secondary | ICD-10-CM | POA: Diagnosis not present

## 2018-08-24 DIAGNOSIS — Z794 Long term (current) use of insulin: Secondary | ICD-10-CM | POA: Diagnosis not present

## 2018-08-24 DIAGNOSIS — I129 Hypertensive chronic kidney disease with stage 1 through stage 4 chronic kidney disease, or unspecified chronic kidney disease: Secondary | ICD-10-CM | POA: Insufficient documentation

## 2018-08-24 DIAGNOSIS — Z6834 Body mass index (BMI) 34.0-34.9, adult: Secondary | ICD-10-CM | POA: Diagnosis not present

## 2018-08-24 DIAGNOSIS — I10 Essential (primary) hypertension: Secondary | ICD-10-CM | POA: Diagnosis not present

## 2018-09-03 ENCOUNTER — Encounter: Payer: Self-pay | Admitting: Gastroenterology

## 2018-09-07 ENCOUNTER — Ambulatory Visit: Payer: Medicare HMO | Admitting: Podiatry

## 2018-09-14 ENCOUNTER — Ambulatory Visit: Payer: Medicare HMO | Admitting: Podiatry

## 2018-09-28 ENCOUNTER — Encounter: Payer: Self-pay | Admitting: Podiatry

## 2018-09-28 ENCOUNTER — Ambulatory Visit: Payer: Medicare HMO | Admitting: Podiatry

## 2018-09-28 VITALS — BP 132/62

## 2018-09-28 DIAGNOSIS — M79675 Pain in left toe(s): Secondary | ICD-10-CM

## 2018-09-28 DIAGNOSIS — B351 Tinea unguium: Secondary | ICD-10-CM

## 2018-09-28 DIAGNOSIS — M79674 Pain in right toe(s): Secondary | ICD-10-CM | POA: Diagnosis not present

## 2018-09-28 DIAGNOSIS — E119 Type 2 diabetes mellitus without complications: Secondary | ICD-10-CM | POA: Diagnosis not present

## 2018-09-28 NOTE — Patient Instructions (Addendum)
Diabetes Mellitus and Foot Care Foot care is an important part of your health, especially when you have diabetes. Diabetes may cause you to have problems because of poor blood flow (circulation) to your feet and legs, which can cause your skin to:  Become thinner and drier.  Break more easily.  Heal more slowly.  Peel and crack. You may also have nerve damage (neuropathy) in your legs and feet, causing decreased feeling in them. This means that you may not notice minor injuries to your feet that could lead to more serious problems. Noticing and addressing any potential problems early is the best way to prevent future foot problems. How to care for your feet Foot hygiene  Wash your feet daily with warm water and mild soap. Do not use hot water. Then, pat your feet and the areas between your toes until they are completely dry. Do not soak your feet as this can dry your skin.  Trim your toenails straight across. Do not dig under them or around the cuticle. File the edges of your nails with an emery board or nail file.  Apply a moisturizing lotion or petroleum jelly to the skin on your feet and to dry, brittle toenails. Use lotion that does not contain alcohol and is unscented. Do not apply lotion between your toes. Shoes and socks  Wear clean socks or stockings every day. Make sure they are not too tight. Do not wear knee-high stockings since they may decrease blood flow to your legs.  Wear shoes that fit properly and have enough cushioning. Always look in your shoes before you put them on to be sure there are no objects inside.  To break in new shoes, wear them for just a few hours a day. This prevents injuries on your feet. Wounds, scrapes, corns, and calluses  Check your feet daily for blisters, cuts, bruises, sores, and redness. If you cannot see the bottom of your feet, use a mirror or ask someone for help.  Do not cut corns or calluses or try to remove them with medicine.  If you  find a minor scrape, cut, or break in the skin on your feet, keep it and the skin around it clean and dry. You may clean these areas with mild soap and water. Do not clean the area with peroxide, alcohol, or iodine.  If you have a wound, scrape, corn, or callus on your foot, look at it several times a day to make sure it is healing and not infected. Check for: ? Redness, swelling, or pain. ? Fluid or blood. ? Warmth. ? Pus or a bad smell. General instructions  Do not cross your legs. This may decrease blood flow to your feet.  Do not use heating pads or hot water bottles on your feet. They may burn your skin. If you have lost feeling in your feet or legs, you may not know this is happening until it is too late.  Protect your feet from hot and cold by wearing shoes, such as at the beach or on hot pavement.  Schedule a complete foot exam at least once a year (annually) or more often if you have foot problems. If you have foot problems, report any cuts, sores, or bruises to your health care provider immediately. Contact a health care provider if:  You have a medical condition that increases your risk of infection and you have any cuts, sores, or bruises on your feet.  You have an injury that is not   healing.  You have redness on your legs or feet.  You feel burning or tingling in your legs or feet.  You have pain or cramps in your legs and feet.  Your legs or feet are numb.  Your feet always feel cold.  You have pain around a toenail. Get help right away if:  You have a wound, scrape, corn, or callus on your foot and: ? You have pain, swelling, or redness that gets worse. ? You have fluid or blood coming from the wound, scrape, corn, or callus. ? Your wound, scrape, corn, or callus feels warm to the touch. ? You have pus or a bad smell coming from the wound, scrape, corn, or callus. ? You have a fever. ? You have a red line going up your leg. Summary  Check your feet every day  for cuts, sores, red spots, swelling, and blisters.  Moisturize feet and legs daily.  Wear shoes that fit properly and have enough cushioning.  If you have foot problems, report any cuts, sores, or bruises to your health care provider immediately.  Schedule a complete foot exam at least once a year (annually) or more often if you have foot problems. This information is not intended to replace advice given to you by your health care provider. Make sure you discuss any questions you have with your health care provider. Document Released: 08/27/2000 Document Revised: 10/12/2017 Document Reviewed: 10/01/2016 Elsevier Interactive Patient Education  2019 Elsevier Inc[employer]  Corns and Calluses Corns are small areas of thickened skin that occur on the top, sides, or tip of a toe. They contain a cone-shaped core with a point that can press on a nerve below. This causes pain.  Calluses are areas of thickened skin that can occur anywhere on the body, including the hands, fingers, palms, soles of the feet, and heels. Calluses are usually larger than corns. What are the causes? Corns and calluses are caused by rubbing (friction) or pressure, such as from shoes that are too tight or do not fit properly. What increases the risk? Corns are more likely to develop in people who have misshapen toes (toe deformities), such as hammer toes. Calluses can occur with friction to any area of the skin. They are more likely to develop in people who:  Work with their hands.  Wear shoes that fit poorly, are too tight, or are high-heeled.  Have toe deformities. What are the signs or symptoms? Symptoms of a corn or callus include:  A hard growth on the skin.  Pain or tenderness under the skin.  Redness and swelling.  Increased discomfort while wearing tight-fitting shoes, if your feet are affected. If a corn or callus becomes infected, symptoms may include:  Redness and swelling that gets  worse.  Pain.  Fluid, blood, or pus draining from the corn or callus. How is this diagnosed? Corns and calluses may be diagnosed based on your symptoms, your medical history, and a physical exam. How is this treated? Treatment for corns and calluses may include:  Removing the cause of the friction or pressure. This may involve: ? Changing your shoes. ? Wearing shoe inserts (orthotics) or other protective layers in your shoes, such as a corn pad. ? Wearing gloves.  Applying medicine to the skin (topical medicine) to help soften skin in the hardened, thickened areas.  Removing layers of dead skin with a file to reduce the size of the corn or callus.  Removing the corn or callus with a scalpel   or laser.  Taking antibiotic medicines, if your corn or callus is infected.  Having surgery, if a toe deformity is the cause. Follow these instructions at home:   Take over-the-counter and prescription medicines only as told by your health care provider.  If you were prescribed an antibiotic, take it as told by your health care provider. Do not stop taking it even if your condition starts to improve.  Wear shoes that fit well. Avoid wearing high-heeled shoes and shoes that are too tight or too loose.  Wear any padding, protective layers, gloves, or orthotics as told by your health care provider.  Soak your hands or feet and then use a file or pumice stone to soften your corn or callus. Do this as told by your health care provider.  Check your corn or callus every day for symptoms of infection. Contact a health care provider if you:  Notice that your symptoms do not improve with treatment.  Have redness or swelling that gets worse.  Notice that your corn or callus becomes painful.  Have fluid, blood, or pus coming from your corn or callus.  Have new symptoms. Summary  Corns are small areas of thickened skin that occur on the top, sides, or tip of a toe.  Calluses are areas of  thickened skin that can occur anywhere on the body, including the hands, fingers, palms, and soles of the feet. Calluses are usually larger than corns.  Corns and calluses are caused by rubbing (friction) or pressure, such as from shoes that are too tight or do not fit properly.  Treatment may include wearing any padding, protective layers, gloves, or orthotics as told by your health care provider. This information is not intended to replace advice given to you by your health care provider. Make sure you discuss any questions you have with your health care provider. Document Released: 06/05/2004 Document Revised: 07/13/2017 Document Reviewed: 07/13/2017 Elsevier Interactive Patient Education  2019 Elsevier Inc.  

## 2018-09-28 NOTE — Progress Notes (Signed)
Subjective: Mckenzie Rogers presents today referred by Gaspar Garbe, MD with diabetes and cc of painful, discolored, thick toenails which interfere with daily activities.  Pain is aggravated when wearing enclosed shoe gear.  Patient denies any history of foot wounds. Denies any numbness, tingling or burning in feet.  Past Medical History:  Diagnosis Date  . Diabetes (HCC)   . Edema   . Osteoarthritis of both knees     There are no active problems to display for this patient.   Past Surgical History:  Procedure Laterality Date  . FOOT SURGERY Left   . TUBAL LIGATION       Current Outpatient Medications:  .  ACCU-CHEK AVIVA PLUS test strip, , Disp: , Rfl:  .  fluconazole (DIFLUCAN) 150 MG tablet, , Disp: , Rfl:  .  Lancets (ONETOUCH DELICA PLUS LANCET30G) MISC, , Disp: , Rfl:  .  meloxicam (MOBIC) 15 MG tablet, Take 15 mg by mouth daily., Disp: , Rfl:  .  OZEMPIC, 1 MG/DOSE, 2 MG/1.5ML SOPN, , Disp: , Rfl:  .  TOVIAZ 4 MG TB24 tablet, , Disp: , Rfl:  .  valsartan (DIOVAN) 80 MG tablet, , Disp: , Rfl:  .  albuterol (PROVENTIL HFA;VENTOLIN HFA) 108 (90 Base) MCG/ACT inhaler, Inhale 1-2 puffs into the lungs every 6 (six) hours as needed for wheezing or shortness of breath. (Patient not taking: Reported on 09/28/2018), Disp: 1 Inhaler, Rfl: 0 .  benzonatate (TESSALON) 100 MG capsule, Take 1 capsule (100 mg total) by mouth every 8 (eight) hours. (Patient not taking: Reported on 09/28/2018), Disp: 9 capsule, Rfl: 0  Allergies  Allergen Reactions  . Oxycodone-Aspirin     REACTION: tongue swells    Social History   Occupational History  . Not on file  Tobacco Use  . Smoking status: Never Smoker  . Smokeless tobacco: Never Used  Substance and Sexual Activity  . Alcohol use: Not on file  . Drug use: Not on file  . Sexual activity: Not on file    Family History  Problem Relation Age of Onset  . Diabetes Maternal Aunt   . CVA Daughter   . Kidney failure Son       There is no immunization history on file for this patient.   Review of systems: Positive Findings in bold print.  Constitutional:  chills, fatigue, fever, sweats, weight change Communication: Nurse, learning disability, sign Presenter, broadcasting, hand writing, iPad/Android device Head: headaches, head injury Eyes: changes in vision, eye pain, glaucoma, cataracts, macular degeneration, diplopia, glare,  light sensitivity, eyeglasses or contacts, blindness Ears nose mouth throat: Hard of hearing, ringing in ears, deaf, sign language,  vertigo,   nosebleeds,  rhinitis,  cold sores, snoring, swollen glands Cardiovascular: HTN, edema, arrhythmia, pacemaker in place, defibrillator in place,  chest pain/tightness, chronic anticoagulation, blood clot, heart failure Peripheral Vascular: leg cramps, varicose veins, blood clots, lymphedema Respiratory:  difficulty breathing, denies congestion, SOB, wheezing, cough, emphysema Gastrointestinal: change in appetite or weight, abdominal pain, constipation, diarrhea, nausea, vomiting, vomiting blood, change in bowel habits, abdominal pain, jaundice, rectal bleeding, hemorrhoids, Genitourinary:  nocturia,  pain on urination,  blood in urine, Foley catheter, urinary urgency Musculoskeletal: uses mobility aid,  cramping, stiff joints, painful joints, decreased joint motion, fractures, OA, gout Skin: +changes in toenails, color change, dryness, itching, mole changes,  rash  Neurological: headaches, numbness in feet, paresthesias in feet, burning in feet, fainting,  seizures, change in speech. denies headaches, memory problems/poor historian, cerebral palsy, weakness, paralysis Endocrine:  diabetes, hypothyroidism, hyperthyroidism,  goiter, dry mouth, flushing, heat intolerance,  cold intolerance,  excessive thirst, denies polyuria,  nocturia Hematological:  easy bleeding, excessive bleeding, easy bruising, enlarged lymph nodes, on long term blood thinner, history of past  transusions Allergy/immunological:  hives, eczema, frequent infections, multiple drug allergies, seasonal allergies, transplant recipient Psychiatric:  anxiety, depression, mood disorder, suicidal ideations, hallucinations   Objective: Vascular Examination: Capillary refill time immediate x 10 digits Dorsalis pedis 2/4 b/l  Posterior tibial pulses 2/4 b/l No digital hair x 10 digits Skin temperature gradient WNL b/l  Dermatological Examination: Skin with normal turgor, texture and tone b/l  Toenails 1-5 b/l discolored, thick, dystrophic with subungual debris and pain with palpation to nailbeds due to thickness of nails.  Musculoskeletal: Muscle strength 5/5 to all LE muscle groups  Neurological: Sensation intact with 10 gram monofilament Vibratory sensation intact.  Assessment: 1. Painful onychomycosis toenails 1-5 b/l  2. NIDDM  Plan: 1. Discussed diabetic foot care principles. Literature dispensed on today. 2. Toenails 1-5 b/l were debrided in length and girth without iatrogenic bleeding. 3. Patient to continue soft, supportive shoe gear 4. Patient to report any pedal injuries to medical professional immediately. 5. Follow up 3 months. Patient/POA to call should there be a concern in the interim.

## 2018-10-10 DIAGNOSIS — Z111 Encounter for screening for respiratory tuberculosis: Secondary | ICD-10-CM | POA: Diagnosis not present

## 2018-10-15 ENCOUNTER — Encounter: Payer: Self-pay | Admitting: Podiatry

## 2018-11-01 DIAGNOSIS — Z794 Long term (current) use of insulin: Secondary | ICD-10-CM | POA: Insufficient documentation

## 2018-11-01 DIAGNOSIS — I10 Essential (primary) hypertension: Secondary | ICD-10-CM | POA: Diagnosis not present

## 2018-11-01 DIAGNOSIS — N183 Chronic kidney disease, stage 3 (moderate): Secondary | ICD-10-CM | POA: Diagnosis not present

## 2018-11-01 DIAGNOSIS — E1122 Type 2 diabetes mellitus with diabetic chronic kidney disease: Secondary | ICD-10-CM | POA: Diagnosis not present

## 2018-11-07 DIAGNOSIS — Z111 Encounter for screening for respiratory tuberculosis: Secondary | ICD-10-CM | POA: Diagnosis not present

## 2018-11-09 DIAGNOSIS — M859 Disorder of bone density and structure, unspecified: Secondary | ICD-10-CM | POA: Diagnosis not present

## 2018-11-16 ENCOUNTER — Telehealth (INDEPENDENT_AMBULATORY_CARE_PROVIDER_SITE_OTHER): Payer: Self-pay | Admitting: Orthopedic Surgery

## 2018-11-16 NOTE — Telephone Encounter (Signed)
Patient called stating that she needs a note from Dr. August Saucer that it is not safe for her to go up and down the stairs at her home.  WG#[phone].  Thank you.

## 2018-11-16 NOTE — Telephone Encounter (Signed)
Please advise if ok for note. Not seen since 08/2018

## 2018-11-23 ENCOUNTER — Telehealth (INDEPENDENT_AMBULATORY_CARE_PROVIDER_SITE_OTHER): Payer: Self-pay | Admitting: Orthopedic Surgery

## 2018-11-27 NOTE — Telephone Encounter (Signed)
Okay for note that says patient has bilateral knee arthritis.  This makes negotiating stairs more difficult in general due to pain and decreased flexibility and loss of strength

## 2018-11-28 NOTE — Telephone Encounter (Signed)
IC advised note at front desk.

## 2018-11-29 ENCOUNTER — Telehealth (INDEPENDENT_AMBULATORY_CARE_PROVIDER_SITE_OTHER): Payer: Self-pay | Admitting: Orthopedic Surgery

## 2018-11-29 NOTE — Telephone Encounter (Signed)
Ok for placard? 

## 2018-11-29 NOTE — Telephone Encounter (Signed)
IC advised could pick up at front desk.  

## 2018-11-29 NOTE — Telephone Encounter (Signed)
Y 6 mos

## 2018-11-29 NOTE — Telephone Encounter (Signed)
Patient called requesting a handicap placard.  [phone]

## 2018-12-01 DIAGNOSIS — J069 Acute upper respiratory infection, unspecified: Secondary | ICD-10-CM | POA: Diagnosis not present

## 2018-12-28 ENCOUNTER — Ambulatory Visit (INDEPENDENT_AMBULATORY_CARE_PROVIDER_SITE_OTHER): Payer: Medicare HMO | Admitting: Orthopedic Surgery

## 2018-12-28 ENCOUNTER — Ambulatory Visit: Payer: Medicare HMO | Admitting: Podiatry

## 2019-01-17 DIAGNOSIS — N183 Chronic kidney disease, stage 3 (moderate): Secondary | ICD-10-CM | POA: Diagnosis not present

## 2019-01-17 DIAGNOSIS — E1122 Type 2 diabetes mellitus with diabetic chronic kidney disease: Secondary | ICD-10-CM | POA: Diagnosis not present

## 2019-01-17 DIAGNOSIS — I10 Essential (primary) hypertension: Secondary | ICD-10-CM | POA: Diagnosis not present

## 2019-01-17 DIAGNOSIS — Z794 Long term (current) use of insulin: Secondary | ICD-10-CM | POA: Diagnosis not present

## 2019-01-18 ENCOUNTER — Other Ambulatory Visit: Payer: Self-pay

## 2019-01-18 ENCOUNTER — Ambulatory Visit: Payer: Medicare HMO | Admitting: Orthopedic Surgery

## 2019-01-18 ENCOUNTER — Telehealth: Payer: Self-pay

## 2019-01-18 ENCOUNTER — Encounter: Payer: Self-pay | Admitting: Orthopedic Surgery

## 2019-01-18 DIAGNOSIS — M1712 Unilateral primary osteoarthritis, left knee: Secondary | ICD-10-CM | POA: Diagnosis not present

## 2019-01-18 DIAGNOSIS — M1711 Unilateral primary osteoarthritis, right knee: Secondary | ICD-10-CM | POA: Diagnosis not present

## 2019-01-18 MED ORDER — BUPIVACAINE HCL 0.25 % IJ SOLN
4.0000 mL | INTRAMUSCULAR | Status: AC | PRN
  Administered 2019-01-18: 4 mL via INTRA_ARTICULAR

## 2019-01-18 MED ORDER — LIDOCAINE HCL 1 % IJ SOLN
5.0000 mL | INTRAMUSCULAR | Status: AC | PRN
  Administered 2019-01-18: 5 mL

## 2019-01-18 MED ORDER — METHYLPREDNISOLONE ACETATE 40 MG/ML IJ SUSP
40.0000 mg | INTRAMUSCULAR | Status: AC | PRN
  Administered 2019-01-18: 40 mg via INTRA_ARTICULAR

## 2019-01-18 NOTE — Telephone Encounter (Signed)
Can we get patient approved for bilateral knee gel injections? Thanks.  

## 2019-01-18 NOTE — Progress Notes (Signed)
Office Visit Note   Patient: Mckenzie Rogers           Date of Birth: 08-Oct-1940           MRN: [id number] Visit Date: 01/18/2019 Requested by: Gaspar Garbe, MD [address] [place], [place] [address] PCP: Wylene Simmer [name], MD  Subjective: Chief Complaint  Patient presents with  . Right Knee - Pain  . Left Knee - Pain    HPI: Patient presents with bilateral knee pain left worse than right.  She reports stiffness and severe pain.  Radiographs done over a year ago shows severe end-stage arthritis tricompartmental in both knees.  She had an injection 08/16/2018 with only 1 month relief.  She does not want to have surgery yet.  She just has purchased a new key.  She is on Mobic and takes Aleve as well.  Denies any interval history or of injury              ROS: All systems reviewed are negative as they relate to the chief complaint within the history of present illness.  Patient denies  fevers or chills.   Assessment & Plan: Visit Diagnoses:  1. Unilateral primary osteoarthritis, left knee   2. Unilateral primary osteoarthritis, right knee     Plan: Impression is bilateral knee pain left worse than right with severe end-stage arthritis present.  She does have a little bit of a flexion contracture on the right but not the left.  No effusion in either knee.  Injection performed in the left knee today and we will preapproved for for bilateral Synvisc injections and will do that in about 6 weeks.  I think she is going to need knee replacement at sometime in the future but she will let us know when she is ready.  Follow-Up Instructions: Return in about 6 weeks (around 03/01/2019).   Orders:  No orders of the defined types were placed in this encounter.  No orders of the defined types were placed in this encounter.     Procedures: Large Joint Inj: L knee on 01/18/2019 11:00 AM Indications: diagnostic evaluation, joint swelling and pain Details: 18 G 1.5 in needle,  superolateral approach  Arthrogram: No  Medications: 5 mL lidocaine 1 %; 40 mg methylPREDNISolone acetate 40 MG/ML; 4 mL bupivacaine 0.25 % Outcome: tolerated well, no immediate complications Procedure, treatment alternatives, risks and benefits explained, specific risks discussed. Consent was given by the patient. Immediately prior to procedure a time out was called to verify the correct patient, procedure, equipment, support staff and site/side marked as required. Patient was prepped and draped in the usual sterile fashion.       Clinical Data: No additional findings.  Objective: Vital Signs: There were no vitals taken for this visit.  Physical Exam:   Constitutional: Patient appears well-developed HEENT:  Head: Normocephalic Eyes:EOM are normal Neck: Normal range of motion Cardiovascular: Normal rate Pulmonary/chest: Effort normal Neurologic: Patient is alert Skin: Skin is warm Psychiatric: Patient has normal mood and affect    Ortho Exam: Ortho exam demonstrates slight valgus alignment left knee and some varus alignment right knee.  Extensor mechanism is intact bilaterally.  Pedal pulses intact.  Antalgic gait to the left is present.  No groin pain with internal X rotation of the leg.  No other masses lymphadenopathy or skin changes noted in that left knee region.  Specialty Comments:  No specialty comments available.  Imaging: No results found.   PMFS History: There  are no active problems to display for this patient.  Past Medical History:  Diagnosis Date  . Diabetes (HCC)   . Edema   . Osteoarthritis of both knees     Family History  Problem Relation Age of Onset  . Diabetes Maternal Aunt   . CVA Daughter   . Kidney failure Son     Past Surgical History:  Procedure Laterality Date  . FOOT SURGERY Left   . TUBAL LIGATION     Social History   Occupational History  . Not on file  Tobacco Use  . Smoking status: Never Smoker  . Smokeless tobacco:  Never Used  Substance and Sexual Activity  . Alcohol use: Not on file  . Drug use: Not on file  . Sexual activity: Not on file

## 2019-01-19 NOTE — Telephone Encounter (Signed)
Noted  

## 2019-01-24 ENCOUNTER — Telehealth: Payer: Self-pay

## 2019-01-24 NOTE — Telephone Encounter (Signed)
Submitted VOB for Monovisc, bilateral knee. 

## 2019-01-29 ENCOUNTER — Telehealth: Payer: Self-pay

## 2019-01-29 DIAGNOSIS — E559 Vitamin D deficiency, unspecified: Secondary | ICD-10-CM | POA: Diagnosis not present

## 2019-01-29 DIAGNOSIS — E1122 Type 2 diabetes mellitus with diabetic chronic kidney disease: Secondary | ICD-10-CM | POA: Diagnosis not present

## 2019-01-29 DIAGNOSIS — I10 Essential (primary) hypertension: Secondary | ICD-10-CM | POA: Diagnosis not present

## 2019-01-29 DIAGNOSIS — E7849 Other hyperlipidemia: Secondary | ICD-10-CM | POA: Diagnosis not present

## 2019-01-29 NOTE — Telephone Encounter (Signed)
Patient is approved for Monovisc, bilateral knee. Buy & Bill Patient will be responsible for 20% of the allowable amount.  May have a co-pay of $45.00 No PA required  Appt. 02/22/2019 with Dr. August Saucer

## 2019-02-01 DIAGNOSIS — M2550 Pain in unspecified joint: Secondary | ICD-10-CM | POA: Diagnosis not present

## 2019-02-01 DIAGNOSIS — E559 Vitamin D deficiency, unspecified: Secondary | ICD-10-CM | POA: Diagnosis not present

## 2019-02-01 DIAGNOSIS — M179 Osteoarthritis of knee, unspecified: Secondary | ICD-10-CM | POA: Diagnosis not present

## 2019-02-01 DIAGNOSIS — E1122 Type 2 diabetes mellitus with diabetic chronic kidney disease: Secondary | ICD-10-CM | POA: Diagnosis not present

## 2019-02-01 DIAGNOSIS — Z1339 Encounter for screening examination for other mental health and behavioral disorders: Secondary | ICD-10-CM | POA: Diagnosis not present

## 2019-02-01 DIAGNOSIS — Z1331 Encounter for screening for depression: Secondary | ICD-10-CM | POA: Diagnosis not present

## 2019-02-01 DIAGNOSIS — M8580 Other specified disorders of bone density and structure, unspecified site: Secondary | ICD-10-CM | POA: Diagnosis not present

## 2019-02-01 DIAGNOSIS — G4733 Obstructive sleep apnea (adult) (pediatric): Secondary | ICD-10-CM | POA: Diagnosis not present

## 2019-02-01 DIAGNOSIS — E7849 Other hyperlipidemia: Secondary | ICD-10-CM | POA: Diagnosis not present

## 2019-02-01 DIAGNOSIS — Z0000 Encounter for general adult medical examination without abnormal findings: Secondary | ICD-10-CM | POA: Diagnosis not present

## 2019-02-22 ENCOUNTER — Ambulatory Visit: Payer: Medicare HMO | Admitting: Orthopedic Surgery

## 2019-03-01 ENCOUNTER — Institutional Professional Consult (permissible substitution): Payer: Medicare HMO | Admitting: Neurology

## 2019-03-06 ENCOUNTER — Ambulatory Visit: Payer: Medicare HMO | Admitting: Podiatry

## 2019-03-07 ENCOUNTER — Other Ambulatory Visit: Payer: Self-pay

## 2019-03-07 ENCOUNTER — Ambulatory Visit (INDEPENDENT_AMBULATORY_CARE_PROVIDER_SITE_OTHER): Payer: Medicare HMO | Admitting: Orthopedic Surgery

## 2019-03-07 ENCOUNTER — Encounter: Payer: Self-pay | Admitting: Orthopedic Surgery

## 2019-03-07 DIAGNOSIS — M1712 Unilateral primary osteoarthritis, left knee: Secondary | ICD-10-CM

## 2019-03-07 DIAGNOSIS — M1711 Unilateral primary osteoarthritis, right knee: Secondary | ICD-10-CM

## 2019-03-07 DIAGNOSIS — M170 Bilateral primary osteoarthritis of knee: Secondary | ICD-10-CM | POA: Diagnosis not present

## 2019-03-07 MED ORDER — HYALURONAN 88 MG/4ML IX SOSY
88.0000 mg | PREFILLED_SYRINGE | INTRA_ARTICULAR | Status: AC | PRN
  Administered 2019-03-07: 88 mg via INTRA_ARTICULAR

## 2019-03-07 MED ORDER — LIDOCAINE HCL 1 % IJ SOLN
5.0000 mL | INTRAMUSCULAR | Status: AC | PRN
  Administered 2019-03-07: 5 mL

## 2019-03-07 NOTE — Progress Notes (Signed)
   Procedure Note  Patient: Mckenzie Rogers             Date of Birth: [birth year]           MRN: [id number]             Visit Date: 03/07/2019  Procedures: Visit Diagnoses:  1. Unilateral primary osteoarthritis, left knee   2. Unilateral primary osteoarthritis, right knee     Large Joint Inj: bilateral knee on 03/07/2019 9:14 AM Indications: pain, joint swelling and diagnostic evaluation Details: 18 G 1.5 in needle, superolateral approach  Arthrogram: No  Medications (Right): 5 mL lidocaine 1 %; 88 mg Hyaluronan 88 MG/4ML Medications (Left): 5 mL lidocaine 1 %; 88 mg Hyaluronan 88 MG/4ML Outcome: tolerated well, no immediate complications Procedure, treatment alternatives, risks and benefits explained, specific risks discussed. Consent was given by the patient. Immediately prior to procedure a time out was called to verify the correct patient, procedure, equipment, support staff and site/side marked as required. Patient was prepped and draped in the usual sterile fashion.

## 2019-03-09 ENCOUNTER — Ambulatory Visit: Payer: Medicare HMO | Admitting: Podiatry

## 2019-03-26 ENCOUNTER — Telehealth: Payer: Self-pay

## 2019-03-26 ENCOUNTER — Other Ambulatory Visit: Payer: Self-pay

## 2019-03-26 ENCOUNTER — Institutional Professional Consult (permissible substitution): Payer: Medicare HMO | Admitting: Neurology

## 2019-03-26 NOTE — Telephone Encounter (Signed)
Pt did not show for their appt with Dr. Athar today.  

## 2019-03-27 ENCOUNTER — Encounter: Payer: Self-pay | Admitting: Neurology

## 2019-05-16 ENCOUNTER — Telehealth: Payer: Self-pay | Admitting: Orthopedic Surgery

## 2019-05-16 NOTE — Telephone Encounter (Signed)
Patient called requesting a handicap placard.  HT#[phone].  Thank you.

## 2019-05-16 NOTE — Telephone Encounter (Signed)
IC advised could pick up at front.  

## 2019-05-16 NOTE — Telephone Encounter (Signed)
6 mos I think

## 2019-05-16 NOTE — Telephone Encounter (Signed)
[place] for this? If so temp vs perm

## 2019-06-07 ENCOUNTER — Ambulatory Visit: Payer: Medicare HMO | Admitting: Orthopedic Surgery

## 2019-06-07 ENCOUNTER — Other Ambulatory Visit: Payer: Self-pay

## 2019-06-23 DIAGNOSIS — Z23 Encounter for immunization: Secondary | ICD-10-CM | POA: Diagnosis not present

## 2019-06-28 ENCOUNTER — Ambulatory Visit (INDEPENDENT_AMBULATORY_CARE_PROVIDER_SITE_OTHER): Payer: Medicare HMO | Admitting: Orthopedic Surgery

## 2019-06-28 ENCOUNTER — Other Ambulatory Visit: Payer: Self-pay

## 2019-06-28 ENCOUNTER — Telehealth: Payer: Self-pay

## 2019-06-28 DIAGNOSIS — M1712 Unilateral primary osteoarthritis, left knee: Secondary | ICD-10-CM

## 2019-06-28 DIAGNOSIS — M1711 Unilateral primary osteoarthritis, right knee: Secondary | ICD-10-CM

## 2019-06-28 NOTE — Telephone Encounter (Signed)
Noted  

## 2019-06-28 NOTE — Telephone Encounter (Signed)
Bilateral knee gel injections 

## 2019-06-29 ENCOUNTER — Encounter: Payer: Self-pay | Admitting: Orthopedic Surgery

## 2019-06-29 DIAGNOSIS — M1712 Unilateral primary osteoarthritis, left knee: Secondary | ICD-10-CM

## 2019-06-29 DIAGNOSIS — M170 Bilateral primary osteoarthritis of knee: Secondary | ICD-10-CM | POA: Diagnosis not present

## 2019-06-29 DIAGNOSIS — M1711 Unilateral primary osteoarthritis, right knee: Secondary | ICD-10-CM | POA: Diagnosis not present

## 2019-06-29 MED ORDER — METHYLPREDNISOLONE ACETATE 40 MG/ML IJ SUSP
40.0000 mg | INTRAMUSCULAR | Status: AC | PRN
  Administered 2019-06-29: 40 mg via INTRA_ARTICULAR

## 2019-06-29 MED ORDER — BUPIVACAINE HCL 0.25 % IJ SOLN
4.0000 mL | INTRAMUSCULAR | Status: AC | PRN
  Administered 2019-06-29: 4 mL via INTRA_ARTICULAR

## 2019-06-29 MED ORDER — METHYLPREDNISOLONE ACETATE 80 MG/ML IJ SUSP
80.0000 mg | INTRAMUSCULAR | Status: AC | PRN
  Administered 2019-06-29: 80 mg via INTRA_ARTICULAR

## 2019-06-29 MED ORDER — LIDOCAINE HCL 1 % IJ SOLN
5.0000 mL | INTRAMUSCULAR | Status: AC | PRN
  Administered 2019-06-29: 5 mL

## 2019-06-29 NOTE — Progress Notes (Signed)
Office Visit Note   Patient: Mckenzie Rogers           Date of Birth: 1941/01/13           MRN: [id number] Visit Date: 06/28/2019 Requested by: Haywood Pao, MD [address] [place],  [place] [address] PCP: Osborne Casco Fransico Him, MD  Subjective: Chief Complaint  Patient presents with  . Right Knee - Pain, Follow-up  . Left Knee - Pain, Follow-up    HPI: Mckenzie Rogers is a 78 y.o. female who presents to the office complaining of bilateral knee pain.  Patient notes left knee hurts worse than her right knee.  She denies any mechanical symptoms.  He notes her knees occasionally give way.  She walks a lot for exercise.  She has occasional swelling in the knees.  Patient had bilateral knee Monovisc injections on 03/07/2019 that provided very good relief..                ROS:  All systems reviewed are negative as they relate to the chief complaint within the history of present illness.  Patient denies fevers or chills.  Assessment & Plan: Visit Diagnoses: No diagnosis found.  Plan: Patient is a 78 year old female who presents with bilateral knee pain.  She has history of bilateral knee osteoarthritis and wants to avoid surgery.  She walks a lot for exercise so recommended that she try stationary bike as this is a less loadbearing exercise.  Offered bilateral cortisone injections of the bilateral knees.  Patient agreed and tolerated the procedure well.  Patient will follow up in 3 months for bilateral knee Monovisc injections.  This patient is diagnosed with osteoarthritis of the knee(s).    Radiographs show evidence of joint space narrowing, osteophytes, subchondral sclerosis and/or subchondral cysts.  This patient has knee pain which interferes with functional and activities of daily living.    This patient has experienced inadequate response, adverse effects and/or intolerance with conservative treatments such as acetaminophen, NSAIDS, topical creams, physical therapy or regular  exercise, knee bracing and/or weight loss.   This patient has experienced inadequate response or has a contraindication to intra articular steroid injections for at least 3 months.   This patient is not scheduled to have a total knee replacement within 6 months of starting treatment with viscosupplementation.   Follow-Up Instructions: No follow-ups on file.   Orders:  No orders of the defined types were placed in this encounter.  No orders of the defined types were placed in this encounter.     Procedures: Large Joint Inj: bilateral knee on 06/29/2019 10:56 PM Indications: diagnostic evaluation, joint swelling and pain Details: 18 G 1.5 in needle, superolateral approach  Arthrogram: No  Medications (Right): 5 mL lidocaine 1 %; 4 mL bupivacaine 0.25 %; 40 mg methylPREDNISolone acetate 40 MG/ML Medications (Left): 5 mL lidocaine 1 %; 80 mg methylPREDNISolone acetate 80 MG/ML Outcome: tolerated well, no immediate complications Procedure, treatment alternatives, risks and benefits explained, specific risks discussed. Consent was given by the patient. Immediately prior to procedure a time out was called to verify the correct patient, procedure, equipment, support staff and site/side marked as required. Patient was prepped and draped in the usual sterile fashion.       Clinical Data: No additional findings.  Objective: Vital Signs: There were no vitals taken for this visit.  Physical Exam:  Constitutional: Patient appears well-developed HEENT:  Head: Normocephalic Eyes:EOM are normal Neck: Normal range of motion Cardiovascular: Normal rate Pulmonary/chest:  Effort normal Neurologic: Patient is alert Skin: Skin is warm Psychiatric: Patient has normal mood and affect  Ortho Exam:  Bilateral knee Exam No effusion of the right knee.  Mild effusion of the left knee. Extensor mechanism intact No TTP over the medial jointlines, quad tendon, patellar tendon, pes anserinus,  patella, tibial tubercle, LCL/MCL insertions TTP over the lateral joint lines bilaterally. Stable to varus/valgus stresses.  Stable to anterior/posterior drawer Flexion contracture of 20 degrees of the right knee.  Flexion contracture of 10 degrees of the left knee. Flexion > 90 degrees  Specialty Comments:  No specialty comments available.  Imaging: No results found.   PMFS History: There are no active problems to display for this patient.  Past Medical History:  Diagnosis Date  . Diabetes ([facility])   . Edema   . Osteoarthritis of both knees     Family History  Problem Relation Age of Onset  . Diabetes Maternal Aunt   . CVA Daughter   . Kidney failure Son     Past Surgical History:  Procedure Laterality Date  . FOOT SURGERY Left   . TUBAL LIGATION     Social History   Occupational History  . Not on file  Tobacco Use  . Smoking status: Never Smoker  . Smokeless tobacco: Never Used  Substance and Sexual Activity  . Alcohol use: Not on file  . Drug use: Not on file  . Sexual activity: Not on file

## 2019-07-03 NOTE — Progress Notes (Signed)
Noted.  Will submit last week in November 2020.

## 2019-07-30 ENCOUNTER — Telehealth: Payer: Self-pay | Admitting: Radiology

## 2019-07-30 NOTE — Telephone Encounter (Signed)
Bilateral knees Monovisc, Dean pt, appt needed after 09/06/19.  Review last VOB to see if new submission/request needed.

## 2019-08-02 DIAGNOSIS — Z794 Long term (current) use of insulin: Secondary | ICD-10-CM | POA: Diagnosis not present

## 2019-08-02 DIAGNOSIS — N182 Chronic kidney disease, stage 2 (mild): Secondary | ICD-10-CM | POA: Diagnosis not present

## 2019-08-02 DIAGNOSIS — L219 Seborrheic dermatitis, unspecified: Secondary | ICD-10-CM | POA: Diagnosis not present

## 2019-08-02 DIAGNOSIS — R809 Proteinuria, unspecified: Secondary | ICD-10-CM | POA: Diagnosis not present

## 2019-08-02 DIAGNOSIS — M179 Osteoarthritis of knee, unspecified: Secondary | ICD-10-CM | POA: Diagnosis not present

## 2019-08-02 DIAGNOSIS — N3281 Overactive bladder: Secondary | ICD-10-CM | POA: Diagnosis not present

## 2019-08-02 DIAGNOSIS — E1122 Type 2 diabetes mellitus with diabetic chronic kidney disease: Secondary | ICD-10-CM | POA: Diagnosis not present

## 2019-08-02 DIAGNOSIS — D692 Other nonthrombocytopenic purpura: Secondary | ICD-10-CM | POA: Diagnosis not present

## 2019-08-02 DIAGNOSIS — I129 Hypertensive chronic kidney disease with stage 1 through stage 4 chronic kidney disease, or unspecified chronic kidney disease: Secondary | ICD-10-CM | POA: Diagnosis not present

## 2019-08-02 NOTE — Telephone Encounter (Signed)
Need to submit for VOB after 09/14/19.  Patient has appt 09/26/18 for this.

## 2019-08-03 NOTE — Telephone Encounter (Signed)
See below

## 2019-08-06 NOTE — Telephone Encounter (Signed)
Correct, will submit after 09/14/2019 for bilateral knee, Monovisc.

## 2019-09-19 ENCOUNTER — Telehealth: Payer: Self-pay

## 2019-09-19 NOTE — Telephone Encounter (Signed)
Submitted VOB for Monovisc, bilateral knee. 

## 2019-09-25 ENCOUNTER — Telehealth: Payer: Self-pay

## 2019-09-25 NOTE — Telephone Encounter (Signed)
PA required for Monovisc, bilateral knee. Faxed completed PA form to [facility] at [phone].

## 2019-09-27 ENCOUNTER — Telehealth: Payer: Self-pay

## 2019-09-27 ENCOUNTER — Ambulatory Visit: Payer: Medicare HMO | Admitting: Orthopedic Surgery

## 2019-09-27 NOTE — Telephone Encounter (Signed)
Can patient be worked in tomorrow morning for gel injection? Her appointment was canceled due to pending PA, but PA has been approved. Please advise.  Thank you.

## 2019-09-27 NOTE — Telephone Encounter (Signed)
Yes, that would be fine. Thank you.

## 2019-09-27 NOTE — Telephone Encounter (Signed)
Patient is aware that she is approved for gel injection.  Approved for Monovisc, bilateral knee. Buy & Bill Patient will be responsible for 20% OOP. Co-pay of $25.00 PA required PA Approval# [id number] Valid 09/27/2019- 03/26/2020  Appt. 09/28/2019 with Dr. August Saucer

## 2019-09-27 NOTE — Telephone Encounter (Signed)
Thank you. Appointment scheduled 09/28/2019

## 2019-09-28 ENCOUNTER — Ambulatory Visit: Payer: Medicare HMO | Admitting: Orthopedic Surgery

## 2019-10-01 ENCOUNTER — Ambulatory Visit: Payer: Medicare HMO | Admitting: Podiatry

## 2019-10-02 DIAGNOSIS — J302 Other seasonal allergic rhinitis: Secondary | ICD-10-CM | POA: Diagnosis not present

## 2019-10-02 DIAGNOSIS — E1122 Type 2 diabetes mellitus with diabetic chronic kidney disease: Secondary | ICD-10-CM | POA: Diagnosis not present

## 2019-10-02 DIAGNOSIS — G4733 Obstructive sleep apnea (adult) (pediatric): Secondary | ICD-10-CM | POA: Diagnosis not present

## 2019-10-02 DIAGNOSIS — Z20818 Contact with and (suspected) exposure to other bacterial communicable diseases: Secondary | ICD-10-CM | POA: Diagnosis not present

## 2019-10-02 DIAGNOSIS — Z20822 Contact with and (suspected) exposure to covid-19: Secondary | ICD-10-CM | POA: Diagnosis not present

## 2019-10-02 DIAGNOSIS — N183 Chronic kidney disease, stage 3 unspecified: Secondary | ICD-10-CM | POA: Diagnosis not present

## 2019-10-11 ENCOUNTER — Other Ambulatory Visit: Payer: Self-pay

## 2019-10-11 ENCOUNTER — Telehealth: Payer: Self-pay | Admitting: Orthopaedic Surgery

## 2019-10-11 ENCOUNTER — Ambulatory Visit (INDEPENDENT_AMBULATORY_CARE_PROVIDER_SITE_OTHER): Payer: Medicare HMO | Admitting: Orthopedic Surgery

## 2019-10-11 ENCOUNTER — Telehealth: Payer: Self-pay | Admitting: Orthopedic Surgery

## 2019-10-11 ENCOUNTER — Telehealth: Payer: Self-pay

## 2019-10-11 ENCOUNTER — Encounter: Payer: Self-pay | Admitting: Orthopedic Surgery

## 2019-10-11 DIAGNOSIS — M170 Bilateral primary osteoarthritis of knee: Secondary | ICD-10-CM | POA: Diagnosis not present

## 2019-10-11 DIAGNOSIS — M1711 Unilateral primary osteoarthritis, right knee: Secondary | ICD-10-CM | POA: Diagnosis not present

## 2019-10-11 DIAGNOSIS — M1712 Unilateral primary osteoarthritis, left knee: Secondary | ICD-10-CM | POA: Diagnosis not present

## 2019-10-11 MED ORDER — HYALURONAN 88 MG/4ML IX SOSY
88.0000 mg | PREFILLED_SYRINGE | INTRA_ARTICULAR | Status: AC | PRN
  Administered 2019-10-11: 88 mg via INTRA_ARTICULAR

## 2019-10-11 MED ORDER — LIDOCAINE HCL 1 % IJ SOLN
5.0000 mL | INTRAMUSCULAR | Status: AC | PRN
  Administered 2019-10-11: 5 mL

## 2019-10-11 NOTE — Telephone Encounter (Signed)
Dean wanted her approved for bilateral Monovisc injections in 6 months

## 2019-10-11 NOTE — Telephone Encounter (Signed)
Noted  

## 2019-10-11 NOTE — Telephone Encounter (Signed)
Made in error

## 2019-10-11 NOTE — Telephone Encounter (Signed)
Ok for placard? 

## 2019-10-11 NOTE — Telephone Encounter (Signed)
Patient called.   She needs help getting a handicap placard   Patient call back: [phone]

## 2019-10-11 NOTE — Telephone Encounter (Signed)
Ok for 6 mo placard

## 2019-10-11 NOTE — Progress Notes (Signed)
   Procedure Note  Patient: Mckenzie Rogers             Date of Birth: 1941-01-22           MRN: [id number]             Visit Date: 10/11/2019  Procedures: Visit Diagnoses:  1. Unilateral primary osteoarthritis, left knee   2. Unilateral primary osteoarthritis, right knee     Large Joint Inj: bilateral knee on 10/11/2019 9:44 AM Indications: diagnostic evaluation, joint swelling and pain Details: 18 G 1.5 in needle, superolateral approach  Arthrogram: No  Medications (Right): 5 mL lidocaine 1 %; 88 mg Hyaluronan 88 MG/4ML Medications (Left): 5 mL lidocaine 1 %; 88 mg Hyaluronan 88 MG/4ML Outcome: tolerated well, no immediate complications Procedure, treatment alternatives, risks and benefits explained, specific risks discussed. Consent was given by the patient. Immediately prior to procedure a time out was called to verify the correct patient, procedure, equipment, support staff and site/side marked as required. Patient was prepped and draped in the usual sterile fashion.

## 2019-10-12 NOTE — Telephone Encounter (Signed)
IC advised could pick up at front desk.  

## 2019-10-15 NOTE — Telephone Encounter (Signed)
Will submit end of June, 2021- first week of July, 2021.  Patient will not be able to receive gel injection until after 04/09/2020.

## 2019-10-16 DIAGNOSIS — Z23 Encounter for immunization: Secondary | ICD-10-CM | POA: Diagnosis not present

## 2019-11-16 ENCOUNTER — Ambulatory Visit: Payer: Medicare HMO | Admitting: Podiatry

## 2020-01-31 DIAGNOSIS — M859 Disorder of bone density and structure, unspecified: Secondary | ICD-10-CM | POA: Diagnosis not present

## 2020-01-31 DIAGNOSIS — E1122 Type 2 diabetes mellitus with diabetic chronic kidney disease: Secondary | ICD-10-CM | POA: Diagnosis not present

## 2020-01-31 DIAGNOSIS — Z0000 Encounter for general adult medical examination without abnormal findings: Secondary | ICD-10-CM | POA: Diagnosis not present

## 2020-01-31 DIAGNOSIS — E7849 Other hyperlipidemia: Secondary | ICD-10-CM | POA: Diagnosis not present

## 2020-02-07 DIAGNOSIS — G4733 Obstructive sleep apnea (adult) (pediatric): Secondary | ICD-10-CM | POA: Diagnosis not present

## 2020-02-07 DIAGNOSIS — Z0000 Encounter for general adult medical examination without abnormal findings: Secondary | ICD-10-CM | POA: Diagnosis not present

## 2020-02-07 DIAGNOSIS — Z794 Long term (current) use of insulin: Secondary | ICD-10-CM | POA: Diagnosis not present

## 2020-02-07 DIAGNOSIS — E1122 Type 2 diabetes mellitus with diabetic chronic kidney disease: Secondary | ICD-10-CM | POA: Diagnosis not present

## 2020-02-07 DIAGNOSIS — N183 Chronic kidney disease, stage 3 unspecified: Secondary | ICD-10-CM | POA: Diagnosis not present

## 2020-02-07 DIAGNOSIS — I129 Hypertensive chronic kidney disease with stage 1 through stage 4 chronic kidney disease, or unspecified chronic kidney disease: Secondary | ICD-10-CM | POA: Diagnosis not present

## 2020-02-07 DIAGNOSIS — N3281 Overactive bladder: Secondary | ICD-10-CM | POA: Diagnosis not present

## 2020-02-07 DIAGNOSIS — R82998 Other abnormal findings in urine: Secondary | ICD-10-CM | POA: Diagnosis not present

## 2020-02-07 DIAGNOSIS — Z1331 Encounter for screening for depression: Secondary | ICD-10-CM | POA: Diagnosis not present

## 2020-02-07 DIAGNOSIS — D692 Other nonthrombocytopenic purpura: Secondary | ICD-10-CM | POA: Diagnosis not present

## 2020-02-28 ENCOUNTER — Inpatient Hospital Stay: Payer: Medicare HMO

## 2020-02-28 ENCOUNTER — Inpatient Hospital Stay: Payer: Medicare HMO | Attending: Genetic Counselor | Admitting: Genetic Counselor

## 2020-02-28 ENCOUNTER — Other Ambulatory Visit: Payer: Self-pay

## 2020-02-28 DIAGNOSIS — Z801 Family history of malignant neoplasm of trachea, bronchus and lung: Secondary | ICD-10-CM | POA: Diagnosis not present

## 2020-02-28 DIAGNOSIS — Z315 Encounter for genetic counseling: Secondary | ICD-10-CM

## 2020-02-28 DIAGNOSIS — Z8051 Family history of malignant neoplasm of kidney: Secondary | ICD-10-CM

## 2020-02-28 DIAGNOSIS — Z800 Family history of malignant neoplasm of digestive organs: Secondary | ICD-10-CM | POA: Diagnosis not present

## 2020-02-28 DIAGNOSIS — Z809 Family history of malignant neoplasm, unspecified: Secondary | ICD-10-CM | POA: Diagnosis not present

## 2020-02-29 ENCOUNTER — Encounter: Payer: Self-pay | Admitting: Genetic Counselor

## 2020-02-29 DIAGNOSIS — Z8051 Family history of malignant neoplasm of kidney: Secondary | ICD-10-CM | POA: Insufficient documentation

## 2020-02-29 DIAGNOSIS — Z800 Family history of malignant neoplasm of digestive organs: Secondary | ICD-10-CM | POA: Insufficient documentation

## 2020-02-29 DIAGNOSIS — Z801 Family history of malignant neoplasm of trachea, bronchus and lung: Secondary | ICD-10-CM | POA: Insufficient documentation

## 2020-02-29 NOTE — Progress Notes (Signed)
REFERRING PROVIDER: Haywood Pao, MD [address] [place],  [place] [address]  PRIMARY PROVIDER:  [name], Fransico Him, MD  PRIMARY REASON FOR VISIT:  1. Family history of pancreatic cancer   2. Family history of kidney cancer   3. Family history of lung cancer       HISTORY OF PRESENT ILLNESS:   Ms. Mckenzie Rogers, a 79 y.o. female, was seen for a [place] cancer genetics consultation at the request of Dr. Osborne Casco due to a family history of cancer.  Ms. Berkel presents to clinic today to discuss the possibility of a hereditary predisposition to cancer, genetic testing, and to further clarify her future cancer risks, as well as potential cancer risks for family members.   Ms. Philbrick is a 79 y.o. female with no personal history of cancer.     RISK FACTORS:  Menarche was at age 43.  First live birth at age 46.  OCP use for approximately 2 years.  Ovaries intact: no.  Hysterectomy: yes.  Menopausal status: postmenopausal.  HRT use: 0 years. Colonoscopy: yes; 07/2008 - no polyps. Mammogram within the last year: no. Number of breast biopsies: 0. Any excessive radiation exposure in the past: no   Past Medical History:  Diagnosis Date  . Diabetes ([place])   . Edema   . Family history of kidney cancer   . Family history of lung cancer   . Family history of pancreatic cancer   . Osteoarthritis of both knees     Past Surgical History:  Procedure Laterality Date  . FOOT SURGERY Left   . TUBAL LIGATION      Social History   Socioeconomic History  . Marital status: Single    Spouse name: Not on file  . Number of children: Not on file  . Years of education: Not on file  . Highest education level: Not on file  Occupational History  . Not on file  Tobacco Use  . Smoking status: Never Smoker  . Smokeless tobacco: Never Used  Substance and Sexual Activity  . Alcohol use: Not on file  . Drug use: Not on file  . Sexual activity: Not on file  Other Topics Concern   . Not on file  Social History Narrative  . Not on file   Social Determinants of Health   Financial Resource Strain:   . Difficulty of Paying Living Expenses:   Food Insecurity:   . Worried About Charity fundraiser in the Last Year:   . Arboriculturist in the Last Year:   Transportation Needs:   . Film/video editor (Medical):   Marland Kitchen Lack of Transportation (Non-Medical):   Physical Activity:   . Days of Exercise per Week:   . Minutes of Exercise per Session:   Stress:   . Feeling of Stress :   Social Connections:   . Frequency of Communication with Friends and Family:   . Frequency of Social Gatherings with Friends and Family:   . Attends Religious Services:   . Active Member of Clubs or Organizations:   . Attends Archivist Meetings:   Marland Kitchen Marital Status:      FAMILY HISTORY:  We obtained a detailed, 4-generation family history.  Significant diagnoses are listed below: Family History  Problem Relation Age of Onset  . Diabetes Maternal Aunt   . CVA Daughter   . Kidney failure Son   . Pancreatic cancer Father 64  . Kidney cancer Half-Sister 76  non-smoker  . Lung cancer Half-Sister 72       hx of smoking  . Cancer Half-Sister 49       unknown primary   Ms. Walpole has five sons and one daughter, ranging in age from 46-60. She has two maternal half-sisters, four maternal half-brothers, and at least one paternal half-sister. One maternal half-sister died recently from lung cancer at the age of 22. Her other maternal half-sister was recently diagnosed with kidney cancer. Her paternal half-sister died at the age of 42 and had cancer (unknown primary).   Ms. Milo mother died at the age of 24 and did not have cancer. Ms. Lenart had two maternal aunts. Her maternal grandfather died at the age of 29, and she does not have information about her maternal grandmother. There are no known diagnoses of cancer on the maternal side of the family.  Ms.  Vinal father died at the age of 65 from pancreatic cancer. She had two paternal aunts and one paternal uncle. She does not have information about her paternal grandparents. There are no other known diagnoses of cancer on the paternal side of the family.  Ms. Kozlowski is unaware of previous family history of genetic testing for hereditary cancer risks. Patient's maternal ancestors are of [place] American descent, and paternal ancestors are of [place] and [place] descent. There is no reported Ashkenazi Jewish ancestry. There is no known consanguinity.  GENETIC COUNSELING ASSESSMENT: Ms. Esteve is a 79 y.o. female with a family history of pancreatic cancer, which is somewhat suggestive of a hereditary cancer syndrome and predisposition to cancer. We, therefore, discussed and recommended the following at today's visit.   DISCUSSION: We discussed that, in general, most cancer is not inherited in families, but instead is sporadic or familial. Sporadic cancers occur by chance and typically happen at older ages (>50 years) as this type of cancer is caused by genetic changes acquired during an individual's lifetime. Some families have more cancers than would be expected by chance; however, the ages or types of cancer are not consistent with a known genetic mutation or known genetic mutations have been ruled out. This type of familial cancer is thought to be due to a combination of multiple genetic, environmental, hormonal, and lifestyle factors. While this combination of factors likely increases the risk of cancer, the exact source of this risk is not currently identifiable or testable.    We discussed that approximately 5-10% of cancer is hereditary, meaning that it is due to a mutation in a single gene that is passed down from generation to generation in a family. Most hereditary cases of pancreatic cancer are associated with the BRCA genes. There are other genes that can be associated with  hereditary pancreatic cancer syndromes. These include APC, CDKN2A, ATM, ect. We discussed that testing is beneficial for several reasons, including knowing about other cancer risks, identifying potential screening and risk-reduction options that may be appropriate, and to understand if other family members could be at risk for cancer and allow them to undergo genetic testing.  We reviewed the characteristics, features and inheritance patterns of hereditary cancer syndromes. We also discussed genetic testing, including the appropriate family members to test, the process of testing, insurance coverage and turn-around-time for results. We discussed the implications of a negative, positive and/or variant of uncertain significant result. We recommended Ms. Hinderliter pursue genetic testing for the [facility] Multi-Cancer gene panel.   The Multi-Cancer Panel offered by [facility] includes sequencing and/or deletion duplication testing of  the following 85 genes: AIP, ALK, APC, ATM, AXIN2,BAP1,  BARD1, BLM, BMPR1A, BRCA1, BRCA2, BRIP1, CASR, CDC73, CDH1, CDK4, CDKN1B, CDKN1C, CDKN2A (p14ARF), CDKN2A (p16INK4a), CEBPA, CHEK2, CTNNA1, DICER1, DIS3L2, EGFR (c.2369C>T, p.Thr790Met variant only), EPCAM (Deletion/duplication testing only), FH, FLCN, GATA2, GPC3, GREM1 (Promoter region deletion/duplication testing only), HOXB13 (c.251G>A, p.Gly84Glu), HRAS, KIT, MAX, MEN1, MET, MITF (c.952G>A, p.Glu318Lys variant only), MLH1, MSH2, MSH3, MSH6, MUTYH, NBN, NF1, NF2, NTHL1, PALB2, PDGFRA, PHOX2B, PMS2, POLD1, POLE, POT1, PRKAR1A, PTCH1, PTEN, RAD50, RAD51C, RAD51D, RB1, RECQL4, RET, RNF43, RUNX1, SDHAF2, SDHA (sequence changes only), SDHB, SDHC, SDHD, SMAD4, SMARCA4, SMARCB1, SMARCE1, STK11, SUFU, TERC, TERT, TMEM127, TP53, TSC1, TSC2, VHL, WRN and WT1.  Based on Ms. Riera's family history of cancer, she meets NCCN medical criteria for genetic testing. Despite that she meets criteria, she may still have an out of pocket cost. We  discussed that if her out of pocket cost for testing is over $100, the laboratory will call and confirm whether she wants to proceed with testing.  If the out of pocket cost of testing is less than $100 she will be billed by the genetic testing laboratory.   PLAN: After considering the risks, benefits, and limitations, Ms. Eppard provided informed consent to pursue genetic testing and the blood sample was sent to [facility] for analysis of the Multi-Cancer Panel. Results should be available within approximately two-three weeks' time, at which point they will be disclosed by telephone to Ms. Siebers, as will any additional recommendations warranted by these results. Ms. Stapp will receive a summary of her genetic counseling visit and a copy of her results once available. This information will also be available in Epic.   Ms. Peterkin questions were answered to her satisfaction today. Our contact information was provided should additional questions or concerns arise. Thank you for the referral and allowing [place] to share in the care of your patient.   Clint Guy, [place], [facility] Licensed, Certified Dispensing [email or web address] Phone: [phone]  The patient was seen for a total of 50 minutes in face-to-face genetic counseling.  This patient was discussed with Drs. Magrinat, Lindi Adie and/or Burr Medico who agrees with the above.    _______________________________________________________________________ For Office Staff:  Number of people involved in session: 1 Was an Intern/ student involved with case: no

## 2020-03-14 ENCOUNTER — Ambulatory Visit: Payer: Self-pay | Admitting: Genetic Counselor

## 2020-03-14 ENCOUNTER — Encounter: Payer: Self-pay | Admitting: Genetic Counselor

## 2020-03-14 ENCOUNTER — Telehealth: Payer: Self-pay | Admitting: Genetic Counselor

## 2020-03-14 DIAGNOSIS — Z1379 Encounter for other screening for genetic and chromosomal anomalies: Secondary | ICD-10-CM

## 2020-03-14 NOTE — Progress Notes (Signed)
HPI:  Mckenzie Rogers was previously seen in the [place] clinic due to a family history of cancer and concerns regarding a hereditary predisposition to cancer. Please refer to our prior cancer genetics clinic note for more information regarding our discussion, assessment and recommendations, at the time. Mckenzie Rogers recent genetic test results were disclosed to her, as were recommendations warranted by these results. These results and recommendations are discussed in more detail below.  FAMILY HISTORY:  We obtained a detailed, 4-generation family history.  Significant diagnoses are listed below: Family History  Problem Relation Age of Onset  . Diabetes Maternal Aunt   . CVA Daughter   . Kidney failure Son   . Pancreatic cancer Father 57  . Kidney cancer Half-Sister 29       non-smoker  . Lung cancer Half-Sister 71       hx of smoking  . Cancer Half-Sister 75       unknown primary   Mckenzie Rogers has five sons and one daughter, ranging in age from 58-60. She has two maternal half-sisters, four maternal half-brothers, and at least one paternal half-sister. One maternal half-sister died recently from lung cancer at the age of 22. Her other maternal half-sister also passed away recently (03/11/20) with kidney cancer. Her paternal half-sister died at the age of 11 and had cancer (unknown primary).   Mckenzie Rogers mother died at the age of 36 and did not have cancer. Mckenzie Rogers had two maternal aunts. Her maternal grandfather died at the age of 68, and she does not have information about her maternal grandmother. There are no known diagnoses of cancer on the maternal side of the family.  Mckenzie Rogers father died at the age of 37 from pancreatic cancer. She had two paternal aunts and one paternal uncle. She does not have information about her paternal grandparents. There are no other known diagnoses of cancer on the paternal side of the family.  Mckenzie Rogers is  unaware of previous family history of genetic testing for hereditary cancer risks. Patient's maternal ancestors are of [place] American descent, and paternal ancestors are of [place] and [place] descent. There is no reported Ashkenazi Jewish ancestry. There is no known consanguinity.  GENETIC TEST RESULTS: Genetic testing reported out on 03/13/2020 through the [facility] Multi-Cancer panel. No pathogenic variants were detected.   The Multi-Cancer Panel offered by [facility] includes sequencing and/or deletion duplication testing of the following 85 genes: AIP, ALK, APC, ATM, AXIN2,BAP1,  BARD1, BLM, BMPR1A, BRCA1, BRCA2, BRIP1, CASR, CDC73, CDH1, CDK4, CDKN1B, CDKN1C, CDKN2A (p14ARF), CDKN2A (p16INK4a), CEBPA, CHEK2, CTNNA1, DICER1, DIS3L2, EGFR (c.2369C>T, p.Thr790Met variant only), EPCAM (Deletion/duplication testing only), FH, FLCN, GATA2, GPC3, GREM1 (Promoter region deletion/duplication testing only), HOXB13 (c.251G>A, p.Gly84Glu), HRAS, KIT, MAX, MEN1, MET, MITF (c.952G>A, p.Glu318Lys variant only), MLH1, MSH2, MSH3, MSH6, MUTYH, NBN, NF1, NF2, NTHL1, PALB2, PDGFRA, PHOX2B, PMS2, POLD1, POLE, POT1, PRKAR1A, PTCH1, PTEN, RAD50, RAD51C, RAD51D, RB1, RECQL4, RET, RNF43, RUNX1, SDHAF2, SDHA (sequence changes only), SDHB, SDHC, SDHD, SMAD4, SMARCA4, SMARCB1, SMARCE1, STK11, SUFU, TERC, TERT, TMEM127, TP53, TSC1, TSC2, VHL, WRN and WT1. The test report will be scanned into EPIC and located under the Molecular Pathology section of the Results Review tab.  A portion of the result report is included below for reference.     We discussed with Mckenzie Rogers that because current genetic testing is not perfect, it is possible there may be a gene mutation in one of these genes that current testing cannot detect, but  that chance is small.  We also discussed that there could be another gene that has not yet been discovered, or that we have not yet tested, that is responsible for the cancer diagnoses in the family. It  is also possible there is a hereditary cause for the cancer in the family that Mckenzie Rogers did not inherit and therefore was not identified in her testing.  Therefore, it is important to remain in touch with cancer genetics in the future so that we can continue to offer Mckenzie Rogers the most up to date genetic testing.   Genetic testing did identify two variants of uncertain significance (VUS) - one in the PALB2 gene called c.3473A>G and a second in the WRN gene called c.1505G>T. At this time, it is unknown if these variants are associated with increased cancer risk or if they are normal findings, but most variants such as these get reclassified to being inconsequential. They should not be used to make medical management decisions. With time, we suspect the lab will determine the significance of these variants, if any. If we do learn more about them, we will try to contact Mckenzie Rogers to discuss it further. However, it is important to stay in touch with [place] periodically and keep the address and phone number up to date.  CANCER SCREENING RECOMMENDATIONS: Mckenzie Rogers test result is considered negative (normal).  This means that we have not identified a hereditary cause for her family history of cancer at this time. Most cancers happen by chance and this negative test suggests that her family of cancer may fall into this category.    While reassuring, this does not definitively rule out a hereditary predisposition to cancer. It is still possible that there could be genetic mutations that are undetectable by current technology. There could be genetic mutations in genes that have not been tested or identified to increase cancer risk.  Therefore, it is recommended she continue to follow the cancer management and screening guidelines provided by her primary healthcare provider.   An individual's cancer risk and medical management are not determined by genetic test results alone. Overall cancer risk  assessment incorporates additional factors, including personal medical history, family history, and any available genetic information that may result in a personalized plan for cancer prevention and surveillance.  RECOMMENDATIONS FOR FAMILY MEMBERS:  Individuals in this family might be at some increased risk of developing cancer, over the general population risk, simply due to the family history of cancer.  We recommended women in this family have a yearly mammogram beginning at age 33, or 45 years younger than the earliest onset of cancer, an annual clinical breast exam, and perform monthly breast self-exams. Women in this family should also have a gynecological exam as recommended by their primary provider. All family members should be referred for colonoscopy starting at age 53.  FOLLOW-UP: Lastly, we discussed with Ms. Fairclough that cancer genetics is a rapidly advancing field and it is possible that new genetic tests will be appropriate for her and/or her family members in the future. We encouraged her to remain in contact with cancer genetics on an annual basis so we can update her personal and family histories and let her know of advances in cancer genetics that may benefit this family.   Our contact number was provided. Ms. Kwolek questions were answered to her satisfaction, and she knows she is welcome to call us at anytime with additional questions or concerns.   Clint Guy, MS, [facility] Genetic  Counselor [email or web address] Phone: [phone]

## 2020-03-14 NOTE — Telephone Encounter (Signed)
LVM that her genetic test results are available and requested that she call back to discuss them.  

## 2020-03-14 NOTE — Telephone Encounter (Signed)
Revealed negative genetic testing. Discussed that we do not know why there is cancer in the family. There could be a genetic mutation in the family that Mckenzie Rogers did not inherit. There could also be a mutation in a different gene that we are not testing, or our current technology may not be able detect certain mutations. It will therefore be important for her to stay in contact with genetics to keep up with whether additional testing may be appropriate in the future.   Two variants of uncertain significance were detected - one in the PALB2 gene called c.3473A>G and a second in the WRN gene called c.1505G>T. Her result is still considered normal at this time and should not impact her medical management.

## 2020-03-20 ENCOUNTER — Telehealth: Payer: Self-pay

## 2020-03-20 NOTE — Telephone Encounter (Signed)
Submitted VOB, Monovisc, bilateral knee. 

## 2020-04-03 DIAGNOSIS — E1122 Type 2 diabetes mellitus with diabetic chronic kidney disease: Secondary | ICD-10-CM | POA: Diagnosis not present

## 2020-04-03 DIAGNOSIS — N3281 Overactive bladder: Secondary | ICD-10-CM | POA: Diagnosis not present

## 2020-04-03 DIAGNOSIS — R350 Frequency of micturition: Secondary | ICD-10-CM | POA: Diagnosis not present

## 2020-04-09 ENCOUNTER — Telehealth: Payer: Self-pay

## 2020-04-09 ENCOUNTER — Ambulatory Visit: Payer: Medicare HMO | Admitting: Orthopedic Surgery

## 2020-04-09 NOTE — Telephone Encounter (Signed)
PA required for Monovisc, bilateral knee. PA  Pending per [employer] online portal- Tracking#[id number]

## 2020-04-11 ENCOUNTER — Telehealth: Payer: Self-pay

## 2020-04-11 NOTE — Telephone Encounter (Signed)
Approved, Monovisc, bilateral knee. Buy & Bill Patient will be responsible for 20% OOP. Co-pay of $25.00 PA required PA Approval# [id number] Valid 04/09/2020- 10/10/2020

## 2020-04-17 ENCOUNTER — Ambulatory Visit (INDEPENDENT_AMBULATORY_CARE_PROVIDER_SITE_OTHER): Payer: Medicare HMO | Admitting: Orthopedic Surgery

## 2020-04-17 ENCOUNTER — Other Ambulatory Visit: Payer: Self-pay

## 2020-04-17 VITALS — Ht 63.0 in | Wt 197.0 lb

## 2020-04-17 DIAGNOSIS — M1712 Unilateral primary osteoarthritis, left knee: Secondary | ICD-10-CM

## 2020-04-17 DIAGNOSIS — M1711 Unilateral primary osteoarthritis, right knee: Secondary | ICD-10-CM

## 2020-04-24 ENCOUNTER — Encounter: Payer: Self-pay | Admitting: Orthopedic Surgery

## 2020-04-24 NOTE — Progress Notes (Signed)
Office Visit Note   Patient: Mckenzie Rogers           Date of Birth: 10-29-1940           MRN: [id number] Visit Date: 04/17/2020 Requested by: Gaspar Garbe, MD [address] [place],  [place] [address] PCP: Wylene Simmer [name], MD  Subjective: Chief Complaint  Patient presents with  . Left Knee - Pain  . Right Knee - Pain    HPI: PENNELOPE Rogers is a 79 y.o. female who presents to the office complaining of bilateral knee pain.  She returns for planned bilateral knee Monovisc injections.  She has been doing well for several months but her pain has returned.  Last injections gave her about 3 to 4 months of relief.  She denies any radicular symptoms or groin pain.  Denies any mechanical symptoms.  Knees occasionally give way..                ROS: All systems reviewed are negative as they relate to the chief complaint within the history of present illness.  Patient denies fevers or chills.  Assessment & Plan: Visit Diagnoses:  1. Unilateral primary osteoarthritis, left knee   2. Unilateral primary osteoarthritis, right knee     Plan: Patient is a 79 year old female presents complaining of bilateral knee pain.  She is here for bilateral knee Monovisc injections.  She has history of bilateral knee osteoarthritis.  Injections were administered patient tolerated the procedure well.  Plan to follow-up as needed.  Next injections we can do would be in 3 months with cortisone injections.  Patient understands.  Follow-Up Instructions: No follow-ups on file.   Orders:  No orders of the defined types were placed in this encounter.  No orders of the defined types were placed in this encounter.     Procedures: Large Joint Inj: bilateral knee on 04/25/2020 7:12 AM Indications: pain, joint swelling and diagnostic evaluation Details: 18 G 1.5 in needle, superolateral approach  Arthrogram: No  Medications (Right): 5 mL lidocaine 1 %; 88 mg Hyaluronan 88 MG/4ML Medications  (Left): 5 mL lidocaine 1 %; 88 mg Hyaluronan 88 MG/4ML Outcome: tolerated well, no immediate complications Procedure, treatment alternatives, risks and benefits explained, specific risks discussed. Consent was given by the patient. Immediately prior to procedure a time out was called to verify the correct patient, procedure, equipment, support staff and site/side marked as required. Patient was prepped and draped in the usual sterile fashion.       Clinical Data: No additional findings.  Objective: Vital Signs: Ht 5\' 3"  (1.6 m)   Wt 197 lb (89.4 kg)   BMI 34.90 kg/m   Physical Exam:  Constitutional: Patient appears well-developed HEENT:  Head: Normocephalic Eyes:EOM are normal Neck: Normal range of motion Cardiovascular: Normal rate Pulmonary/chest: Effort normal Neurologic: Patient is alert Skin: Skin is warm Psychiatric: Patient has normal mood and affect  Ortho Exam: Ortho exam demonstrates bilateral knees with extensor mechanism intact bilaterally.  Positive effusion bilaterally.  Tenderness palpation over the medial and lateral joint line bilaterally.  No pain with hip range of motion bilaterally.  Specialty Comments:  No specialty comments available.  Imaging: No results found.   PMFS History: Patient Active Problem List   Diagnosis Date Noted  . Genetic testing 03/14/2020  . Family history of pancreatic cancer   . Family history of kidney cancer   . Family history of lung cancer    Past Medical History:  Diagnosis Date  .  Diabetes (HCC)   . Edema   . Family history of kidney cancer   . Family history of lung cancer   . Family history of pancreatic cancer   . Osteoarthritis of both knees     Family History  Problem Relation Age of Onset  . Diabetes Maternal Aunt   . CVA Daughter   . Kidney failure Son   . Pancreatic cancer Father 40  . Kidney cancer Half-Sister 40       non-smoker  . Lung cancer Half-Sister 61       hx of smoking  . Cancer  Half-Sister 71       unknown primary    Past Surgical History:  Procedure Laterality Date  . FOOT SURGERY Left   . TUBAL LIGATION     Social History   Occupational History  . Not on file  Tobacco Use  . Smoking status: Never Smoker  . Smokeless tobacco: Never Used  Substance and Sexual Activity  . Alcohol use: Not on file  . Drug use: Not on file  . Sexual activity: Not on file

## 2020-04-25 DIAGNOSIS — M170 Bilateral primary osteoarthritis of knee: Secondary | ICD-10-CM

## 2020-04-25 DIAGNOSIS — M1711 Unilateral primary osteoarthritis, right knee: Secondary | ICD-10-CM | POA: Diagnosis not present

## 2020-04-25 DIAGNOSIS — M1712 Unilateral primary osteoarthritis, left knee: Secondary | ICD-10-CM | POA: Diagnosis not present

## 2020-04-25 MED ORDER — HYALURONAN 88 MG/4ML IX SOSY
88.0000 mg | PREFILLED_SYRINGE | INTRA_ARTICULAR | Status: AC | PRN
  Administered 2020-04-25: 88 mg via INTRA_ARTICULAR

## 2020-04-25 MED ORDER — LIDOCAINE HCL 1 % IJ SOLN
5.0000 mL | INTRAMUSCULAR | Status: AC | PRN
  Administered 2020-04-25: 5 mL

## 2020-05-01 DIAGNOSIS — R350 Frequency of micturition: Secondary | ICD-10-CM | POA: Diagnosis not present

## 2020-05-01 DIAGNOSIS — E1122 Type 2 diabetes mellitus with diabetic chronic kidney disease: Secondary | ICD-10-CM | POA: Diagnosis not present

## 2020-05-01 DIAGNOSIS — I129 Hypertensive chronic kidney disease with stage 1 through stage 4 chronic kidney disease, or unspecified chronic kidney disease: Secondary | ICD-10-CM | POA: Diagnosis not present

## 2020-05-01 DIAGNOSIS — N182 Chronic kidney disease, stage 2 (mild): Secondary | ICD-10-CM | POA: Diagnosis not present

## 2020-05-09 ENCOUNTER — Telehealth: Payer: Self-pay | Admitting: Orthopedic Surgery

## 2020-05-09 NOTE — Telephone Encounter (Signed)
Ok for this? If so how long?

## 2020-05-09 NOTE — Telephone Encounter (Signed)
Okay for this. 6 months

## 2020-05-09 NOTE — Telephone Encounter (Signed)
Received vm from patient. She is needing a new handicapp parking placard. Callback [phone]

## 2020-05-12 NOTE — Telephone Encounter (Signed)
Placard at front for pick up. Patient advised.

## 2020-05-12 NOTE — Telephone Encounter (Signed)
Placard awaiting signature.

## 2020-05-30 DIAGNOSIS — R3915 Urgency of urination: Secondary | ICD-10-CM | POA: Diagnosis not present

## 2020-05-30 DIAGNOSIS — N3000 Acute cystitis without hematuria: Secondary | ICD-10-CM | POA: Diagnosis not present

## 2020-05-30 DIAGNOSIS — N952 Postmenopausal atrophic vaginitis: Secondary | ICD-10-CM | POA: Diagnosis not present

## 2020-05-30 DIAGNOSIS — R350 Frequency of micturition: Secondary | ICD-10-CM | POA: Diagnosis not present

## 2020-06-03 ENCOUNTER — Telehealth: Payer: Self-pay

## 2020-06-03 NOTE — Telephone Encounter (Signed)
Pt called and wanted to know what time her next appointment was for. Ive updated the pt with the following information. That her next appointment is scheduled for 08/01/20 at 3pm with Dr.Galaway.

## 2020-06-06 DIAGNOSIS — D692 Other nonthrombocytopenic purpura: Secondary | ICD-10-CM | POA: Diagnosis not present

## 2020-06-06 DIAGNOSIS — G4733 Obstructive sleep apnea (adult) (pediatric): Secondary | ICD-10-CM | POA: Diagnosis not present

## 2020-06-06 DIAGNOSIS — R809 Proteinuria, unspecified: Secondary | ICD-10-CM | POA: Diagnosis not present

## 2020-06-06 DIAGNOSIS — J302 Other seasonal allergic rhinitis: Secondary | ICD-10-CM | POA: Diagnosis not present

## 2020-06-06 DIAGNOSIS — Z794 Long term (current) use of insulin: Secondary | ICD-10-CM | POA: Diagnosis not present

## 2020-06-06 DIAGNOSIS — E1122 Type 2 diabetes mellitus with diabetic chronic kidney disease: Secondary | ICD-10-CM | POA: Diagnosis not present

## 2020-06-06 DIAGNOSIS — I129 Hypertensive chronic kidney disease with stage 1 through stage 4 chronic kidney disease, or unspecified chronic kidney disease: Secondary | ICD-10-CM | POA: Diagnosis not present

## 2020-06-06 DIAGNOSIS — E559 Vitamin D deficiency, unspecified: Secondary | ICD-10-CM | POA: Diagnosis not present

## 2020-06-06 DIAGNOSIS — N182 Chronic kidney disease, stage 2 (mild): Secondary | ICD-10-CM | POA: Diagnosis not present

## 2020-06-26 DIAGNOSIS — M1990 Unspecified osteoarthritis, unspecified site: Secondary | ICD-10-CM | POA: Diagnosis not present

## 2020-06-26 DIAGNOSIS — M6281 Muscle weakness (generalized): Secondary | ICD-10-CM | POA: Diagnosis not present

## 2020-06-26 DIAGNOSIS — R350 Frequency of micturition: Secondary | ICD-10-CM | POA: Diagnosis not present

## 2020-06-26 DIAGNOSIS — R3915 Urgency of urination: Secondary | ICD-10-CM | POA: Diagnosis not present

## 2020-08-01 ENCOUNTER — Ambulatory Visit: Payer: Medicare HMO | Admitting: Podiatry

## 2020-08-20 ENCOUNTER — Ambulatory Visit: Payer: Medicare HMO | Admitting: Podiatry

## 2020-08-20 ENCOUNTER — Encounter: Payer: Self-pay | Admitting: Podiatry

## 2020-08-20 ENCOUNTER — Other Ambulatory Visit: Payer: Self-pay

## 2020-08-20 DIAGNOSIS — B351 Tinea unguium: Secondary | ICD-10-CM

## 2020-08-20 DIAGNOSIS — E119 Type 2 diabetes mellitus without complications: Secondary | ICD-10-CM | POA: Diagnosis not present

## 2020-08-20 DIAGNOSIS — M79674 Pain in right toe(s): Secondary | ICD-10-CM | POA: Diagnosis not present

## 2020-08-20 DIAGNOSIS — M79675 Pain in left toe(s): Secondary | ICD-10-CM | POA: Diagnosis not present

## 2020-08-25 NOTE — Progress Notes (Signed)
Subjective:  Patient ID: Mckenzie Rogers, female    DOB: Oct 10, 1940,  MRN: [id number]  79 y.o. female presents with preventative diabetic foot care and painful thick toenails that are difficult to trim. Pain interferes with ambulation. Aggravating factors include wearing enclosed shoe gear. Pain is relieved with periodic professional debridement.Marland Kitchen    PCP: Gaspar Garbe, MD and last visit was: 06/06/2020.  Review of Systems: Negative except as noted in the HPI.  Past Medical History:  Diagnosis Date  . Diabetes (HCC)   . Edema   . Family history of kidney cancer   . Family history of lung cancer   . Family history of pancreatic cancer   . Osteoarthritis of both knees    Past Surgical History:  Procedure Laterality Date  . FOOT SURGERY Left   . TUBAL LIGATION     Patient Active Problem List   Diagnosis Date Noted  . Genetic testing 03/14/2020  . Family history of pancreatic cancer   . Family history of kidney cancer   . Family history of lung cancer     Current Outpatient Medications:  .  ACCU-CHEK AVIVA PLUS test strip, , Disp: , Rfl:  .  albuterol (PROVENTIL HFA;VENTOLIN HFA) 108 (90 Base) MCG/ACT inhaler, Inhale 1-2 puffs into the lungs every 6 (six) hours as needed for wheezing or shortness of breath. (Patient not taking: Reported on 09/28/2018), Disp: 1 Inhaler, Rfl: 0 .  atorvastatin (LIPITOR) 80 MG tablet, Take 80 mg by mouth daily., Disp: , Rfl:  .  benzonatate (TESSALON) 100 MG capsule, Take 1 capsule (100 mg total) by mouth every 8 (eight) hours. (Patient not taking: Reported on 09/28/2018), Disp: 9 capsule, Rfl: 0 .  fluconazole (DIFLUCAN) 150 MG tablet, , Disp: , Rfl:  .  fluticasone (FLONASE) 50 MCG/ACT nasal spray, Place 1 spray into both nostrils daily., Disp: , Rfl:  .  insulin glargine (LANTUS) 100 UNIT/ML injection, Inject 28 Units into the skin daily., Disp: , Rfl:  .  Lancets (ONETOUCH DELICA PLUS LANCET30G) MISC, , Disp: , Rfl:  .  meloxicam (MOBIC) 15  MG tablet, Take 15 mg by mouth daily., Disp: , Rfl:  .  OZEMPIC, 1 MG/DOSE, 2 MG/1.5ML SOPN, , Disp: , Rfl:  .  TOVIAZ 4 MG TB24 tablet, , Disp: , Rfl:  .  umeclidinium-vilanterol (ANORO ELLIPTA) 62.5-25 MCG/INH AEPB, Inhale 1 puff into the lungs daily., Disp: , Rfl:  .  valsartan (DIOVAN) 80 MG tablet, , Disp: , Rfl:  .  VITAMIN D PO, Take 2,000 Units by mouth daily., Disp: , Rfl:  .  Vitamin D, Ergocalciferol, (DRISDOL) 1.25 MG (50000 UT) CAPS capsule, Take 50,000 Units by mouth every 7 (seven) days., Disp: , Rfl:  Allergies  Allergen Reactions  . Orudis [Ketoprofen]   . Oxycodone-Aspirin     REACTION: tongue swells   Social History   Tobacco Use  Smoking Status Never Smoker  Smokeless Tobacco Never Used    Objective:  There were no vitals filed for this visit. Constitutional Patient is a pleasant 79 y.o. African American female in NAD. AAO x 3.  Vascular Capillary refill time to digits immediate b/l. Palpable pedal pulses b/l LE. Pedal hair present. Lower extremity skin temperature gradient within normal limits. No cyanosis or clubbing noted.  Neurologic Normal speech. Protective sensation intact 5/5 intact bilaterally with 10g monofilament b/l. Vibratory sensation intact b/l.  Dermatologic Pedal skin with normal turgor, texture and tone bilaterally. No open wounds bilaterally. No interdigital macerations bilaterally.  Toenails 1-5 b/l elongated, discolored, dystrophic, thickened, crumbly with subungual debris and tenderness to dorsal palpation.  Orthopedic: Normal muscle strength 5/5 to all lower extremity muscle groups bilaterally. No pain crepitus or joint limitation noted with ROM b/l. No gross bony deformities bilaterally.   No flowsheet data found.     Assessment:   1. Pain due to onychomycosis of toenails of both feet   2. Controlled type 2 diabetes mellitus without complication, without long-term current use of insulin (HCC)    Plan:  Patient was evaluated and treated  and all questions answered.  Onychomycosis with pain -Nails palliatively debridement as below. -Educated on self-care  Procedure: Nail Debridement Rationale: Pain Type of Debridement: manual, sharp debridement. Instrumentation: Nail nipper, rotary burr. Number of Nails: 10  -Examined patient. -Continue diabetic foot care principles. -Patient to continue soft, supportive shoe gear daily. -Toenails 1-5 b/l were debrided in length and girth with sterile nail nippers and dremel without iatrogenic bleeding.  -Patient to report any pedal injuries to medical professional immediately. -Patient/POA to call should there be question/concern in the interim.  Return in about 3 months (around 11/18/2020) for diabetic toenails.  Freddie Breech, DPM

## 2020-09-01 ENCOUNTER — Telehealth: Payer: Self-pay | Admitting: Orthopedic Surgery

## 2020-09-01 NOTE — Telephone Encounter (Signed)
Noted  

## 2020-09-01 NOTE — Telephone Encounter (Signed)
I called patient. I advised she could not get gel injections again until February as last Monovisc injections were 04/17/2020.  Per last office note, it did state that patient could get cortisone injections 3 months post gel injection. She was concerned if she got bilateral knee cortisone injections that she could not get gel injection in February. Per Dr. August Saucer, ok to schedule appointment for bilateral knee cortisone injections and submit for gel injections to be done in February.

## 2020-09-01 NOTE — Telephone Encounter (Signed)
Patient called. She would like to get a gel injection in her knee. Her call back number is [phone]

## 2020-09-01 NOTE — Telephone Encounter (Signed)
Please submit for bilateral knee gel injections (last ones done 04/17/2020). Patient aware that she cannot have repeat gel injections until February.  Appointment made with [facility] for bilateral knee cortisone injections 09/11/2020.

## 2020-09-10 ENCOUNTER — Encounter: Payer: Self-pay | Admitting: Surgical

## 2020-09-10 ENCOUNTER — Other Ambulatory Visit: Payer: Self-pay

## 2020-09-10 ENCOUNTER — Ambulatory Visit (INDEPENDENT_AMBULATORY_CARE_PROVIDER_SITE_OTHER): Payer: Medicare HMO | Admitting: Surgical

## 2020-09-10 DIAGNOSIS — M1711 Unilateral primary osteoarthritis, right knee: Secondary | ICD-10-CM | POA: Diagnosis not present

## 2020-09-10 DIAGNOSIS — M1712 Unilateral primary osteoarthritis, left knee: Secondary | ICD-10-CM

## 2020-09-10 NOTE — Progress Notes (Signed)
Office Visit Note   Patient: Mckenzie Rogers           Date of Birth: Sep 19, 1940           MRN: [id number] Visit Date: 09/10/2020 Requested by: Gaspar Garbe, MD [address] [place],  [place] [address] PCP: Wylene Simmer [name], MD  Subjective: Chief Complaint  Patient presents with  . Left Knee - Pain  . Right Knee - Pain    HPI: Mckenzie Rogers is a 79 y.o. female who presents to the office complaining of bilateral knee pain.  Left knee bothers her significantly more than her right knee.  She has a history of bilateral knee osteoarthritis.  Last injection was on 04/17/2020 with bilateral Monovisc injections.  She had relief for about 2 months.  She does have a history of diabetes for which she takes Lantus, Ozempic, [place].  Blood glucose is running about 1 80-1 90.  Last A1c was "seven-point something"..                ROS: All systems reviewed are negative as they relate to the chief complaint within the history of present illness.  Patient denies fevers or chills.  Assessment & Plan: Visit Diagnoses:  1. Unilateral primary osteoarthritis, left knee   2. Unilateral primary osteoarthritis, right knee     Plan: Patient is a 79 year old female presents complaint of bilateral knee pain.  Left knee is bothering her significantly more than the right knee.  She has a history of diabetes with last A1c around 7 reportedly.  Her blood glucose is running close to 200 recently.  She has severe left knee pain and requests left knee aspiration and injection.  20 cc was aspirated from the left knee and the left knee was injected with cortisone.  She tolerated the procedure well.  Do not want to inject both knees due to her history of diabetes.  She may return in 1 week for right knee injection if she desires.  Plan to preapproved patient for gel injections.  This patient is diagnosed with osteoarthritis of the knee(s).    Radiographs show evidence of joint space narrowing, osteophytes,  subchondral sclerosis and/or subchondral cysts.  This patient has knee pain which interferes with functional and activities of daily living.    This patient has experienced inadequate response, adverse effects and/or intolerance with conservative treatments such as acetaminophen, NSAIDS, topical creams, physical therapy or regular exercise, knee bracing and/or weight loss.   This patient has experienced inadequate response or has a contraindication to intra articular steroid injections for at least 3 months.   This patient is not scheduled to have a total knee replacement within 6 months of starting treatment with viscosupplementation.   Follow-Up Instructions: No follow-ups on file.   Orders:  No orders of the defined types were placed in this encounter.  No orders of the defined types were placed in this encounter.     Procedures: Large Joint Inj: L knee on 09/13/2020 1:52 PM Indications: diagnostic evaluation, joint swelling and pain Details: 18 G 1.5 in needle, superolateral approach  Arthrogram: No  Medications: 5 mL lidocaine 1 %; 40 mg methylPREDNISolone acetate 40 MG/ML; 4 mL bupivacaine 0.25 % Aspirate: 20 mL Outcome: tolerated well, no immediate complications Procedure, treatment alternatives, risks and benefits explained, specific risks discussed. Consent was given by the patient. Immediately prior to procedure a time out was called to verify the correct patient, procedure, equipment, support staff and site/side marked  as required. Patient was prepped and draped in the usual sterile fashion.       Clinical Data: No additional findings.  Objective: Vital Signs: There were no vitals taken for this visit.  Physical Exam:  Constitutional: Patient appears well-developed HEENT:  Head: Normocephalic Eyes:EOM are normal Neck: Normal range of motion Cardiovascular: Normal rate Pulmonary/chest: Effort normal Neurologic: Patient is alert Skin: Skin is warm Psychiatric:  Patient has normal mood and affect  Ortho Exam: Ortho exam demonstrates left knee with positive effusion.  Tenderness over the medial lateral joint lines.  Able to perform straight leg raise.  No pain with hip range of motion.  No calf tenderness.  5 degree flexion contracture.  Specialty Comments:  No specialty comments available.  Imaging: No results found.   PMFS History: Patient Active Problem List   Diagnosis Date Noted  . Genetic testing 03/14/2020  . Family history of pancreatic cancer   . Family history of kidney cancer   . Family history of lung cancer    Past Medical History:  Diagnosis Date  . Diabetes (HCC)   . Edema   . Family history of kidney cancer   . Family history of lung cancer   . Family history of pancreatic cancer   . Osteoarthritis of both knees     Family History  Problem Relation Age of Onset  . Diabetes Maternal Aunt   . CVA Daughter   . Kidney failure Son   . Pancreatic cancer Father 31  . Kidney cancer Half-Sister 49       non-smoker  . Lung cancer Half-Sister 14       hx of smoking  . Cancer Half-Sister 60       unknown primary    Past Surgical History:  Procedure Laterality Date  . FOOT SURGERY Left   . TUBAL LIGATION     Social History   Occupational History  . Not on file  Tobacco Use  . Smoking status: Never Smoker  . Smokeless tobacco: Never Used  Substance and Sexual Activity  . Alcohol use: Not on file  . Drug use: Not on file  . Sexual activity: Not on file

## 2020-09-11 ENCOUNTER — Ambulatory Visit: Payer: Medicare HMO | Admitting: Surgical

## 2020-09-11 DIAGNOSIS — Z1152 Encounter for screening for COVID-19: Secondary | ICD-10-CM | POA: Diagnosis not present

## 2020-09-11 DIAGNOSIS — J069 Acute upper respiratory infection, unspecified: Secondary | ICD-10-CM | POA: Diagnosis not present

## 2020-09-13 DIAGNOSIS — M1711 Unilateral primary osteoarthritis, right knee: Secondary | ICD-10-CM | POA: Diagnosis not present

## 2020-09-13 DIAGNOSIS — M1712 Unilateral primary osteoarthritis, left knee: Secondary | ICD-10-CM

## 2020-09-13 MED ORDER — LIDOCAINE HCL 1 % IJ SOLN
5.0000 mL | INTRAMUSCULAR | Status: AC | PRN
  Administered 2020-09-13: 5 mL

## 2020-09-13 MED ORDER — METHYLPREDNISOLONE ACETATE 40 MG/ML IJ SUSP
40.0000 mg | INTRAMUSCULAR | Status: AC | PRN
  Administered 2020-09-13: 40 mg via INTRA_ARTICULAR

## 2020-09-13 MED ORDER — BUPIVACAINE HCL 0.25 % IJ SOLN
4.0000 mL | INTRAMUSCULAR | Status: AC | PRN
  Administered 2020-09-13: 4 mL via INTRA_ARTICULAR

## 2020-09-17 ENCOUNTER — Telehealth: Payer: Self-pay | Admitting: Orthopedic Surgery

## 2020-09-17 NOTE — Telephone Encounter (Signed)
Can you see if we can get auth for patient?

## 2020-09-17 NOTE — Telephone Encounter (Signed)
Patient called asked if she can get the gel injection in both knees as soon as possible? The number to contact patient is [phone]

## 2020-09-17 NOTE — Telephone Encounter (Signed)
This is a Dr. August Saucer pt and she was last in office 09/10/20 said ok to pre approve for gel injections. She had bilateral knee Monovisc last on 04/17/20. I have called the pt and  lm on vm advised that she would not be eligible for injection until 10/18/20. Just wanted to make sure that you had infor to submit auth.

## 2020-09-17 NOTE — Telephone Encounter (Signed)
I do have it.  Thank you.

## 2020-09-18 ENCOUNTER — Ambulatory Visit: Payer: Medicare HMO | Admitting: Orthopedic Surgery

## 2020-09-19 ENCOUNTER — Ambulatory Visit: Payer: Medicare HMO | Admitting: Orthopedic Surgery

## 2020-10-15 ENCOUNTER — Telehealth: Payer: Self-pay

## 2020-10-15 NOTE — Telephone Encounter (Signed)
Submitted VOB, Monovisc, bilateral knee. 

## 2020-10-22 ENCOUNTER — Telehealth: Payer: Self-pay

## 2020-10-22 NOTE — Telephone Encounter (Signed)
PA required for Monovisc, bilaterl knee. Submitted PA online through [facility]. PA Pending# [id number]

## 2020-10-24 ENCOUNTER — Telehealth: Payer: Self-pay

## 2020-10-24 NOTE — Telephone Encounter (Signed)
Approved, Monovisc, bilateral knee. Buy & Bill Patient will be responsible for 20% OOP. Co-pay of $20.00 PA Approval# [id number] Valid 10/22/2020- 04/21/2021  Appt. 11/14/2020 with Dr. August Saucer

## 2020-10-27 ENCOUNTER — Telehealth: Payer: Self-pay | Admitting: Orthopedic Surgery

## 2020-10-27 NOTE — Telephone Encounter (Signed)
Pt called stating she has an appt for Bilateral gel knee injections on 11/14/20 and she was wondering if she came to get a cortisone for her L knee would that keep her from getting the gel injections?

## 2020-10-27 NOTE — Telephone Encounter (Signed)
Please advise thanks.

## 2020-10-27 NOTE — Telephone Encounter (Signed)
Left knee was just injected about 1.5 months ago so best to wait until the gel injection or if she has severe pain and needs injection now, that's an option but would recommend postponing gel injection until 3 months after a cortisone injection

## 2020-10-28 NOTE — Telephone Encounter (Signed)
IC s/w patient and advised. Verbalized understanding.  

## 2020-11-07 DIAGNOSIS — N3000 Acute cystitis without hematuria: Secondary | ICD-10-CM | POA: Diagnosis not present

## 2020-11-07 DIAGNOSIS — R350 Frequency of micturition: Secondary | ICD-10-CM | POA: Diagnosis not present

## 2020-11-07 DIAGNOSIS — R8271 Bacteriuria: Secondary | ICD-10-CM | POA: Diagnosis not present

## 2020-11-13 DIAGNOSIS — Z794 Long term (current) use of insulin: Secondary | ICD-10-CM | POA: Diagnosis not present

## 2020-11-13 DIAGNOSIS — Z6833 Body mass index (BMI) 33.0-33.9, adult: Secondary | ICD-10-CM | POA: Diagnosis not present

## 2020-11-13 DIAGNOSIS — R809 Proteinuria, unspecified: Secondary | ICD-10-CM | POA: Diagnosis not present

## 2020-11-13 DIAGNOSIS — J302 Other seasonal allergic rhinitis: Secondary | ICD-10-CM | POA: Diagnosis not present

## 2020-11-13 DIAGNOSIS — I129 Hypertensive chronic kidney disease with stage 1 through stage 4 chronic kidney disease, or unspecified chronic kidney disease: Secondary | ICD-10-CM | POA: Diagnosis not present

## 2020-11-13 DIAGNOSIS — N182 Chronic kidney disease, stage 2 (mild): Secondary | ICD-10-CM | POA: Diagnosis not present

## 2020-11-13 DIAGNOSIS — G4733 Obstructive sleep apnea (adult) (pediatric): Secondary | ICD-10-CM | POA: Diagnosis not present

## 2020-11-13 DIAGNOSIS — E669 Obesity, unspecified: Secondary | ICD-10-CM | POA: Diagnosis not present

## 2020-11-13 DIAGNOSIS — E1122 Type 2 diabetes mellitus with diabetic chronic kidney disease: Secondary | ICD-10-CM | POA: Diagnosis not present

## 2020-11-14 ENCOUNTER — Ambulatory Visit: Payer: Medicare HMO | Admitting: Orthopedic Surgery

## 2020-12-04 ENCOUNTER — Ambulatory Visit: Payer: Medicare HMO | Admitting: Orthopedic Surgery

## 2020-12-05 ENCOUNTER — Ambulatory Visit: Payer: Medicare HMO | Admitting: Podiatry

## 2021-01-02 ENCOUNTER — Encounter: Payer: Self-pay | Admitting: Surgical

## 2021-01-02 ENCOUNTER — Ambulatory Visit (INDEPENDENT_AMBULATORY_CARE_PROVIDER_SITE_OTHER): Payer: Medicare HMO | Admitting: Surgical

## 2021-01-02 DIAGNOSIS — M1712 Unilateral primary osteoarthritis, left knee: Secondary | ICD-10-CM | POA: Diagnosis not present

## 2021-01-02 DIAGNOSIS — M1711 Unilateral primary osteoarthritis, right knee: Secondary | ICD-10-CM

## 2021-01-02 MED ORDER — BETAMETHASONE SOD PHOS & ACET 6 (3-3) MG/ML IJ SUSP
6.0000 mg | INTRAMUSCULAR | Status: AC | PRN
  Administered 2021-01-02: 6 mg via INTRA_ARTICULAR

## 2021-01-02 MED ORDER — LIDOCAINE HCL 1 % IJ SOLN
5.0000 mL | INTRAMUSCULAR | Status: AC | PRN
  Administered 2021-01-02: 5 mL

## 2021-01-02 MED ORDER — BUPIVACAINE HCL 0.25 % IJ SOLN
4.0000 mL | INTRAMUSCULAR | Status: AC | PRN
  Administered 2021-01-02: 4 mL via INTRA_ARTICULAR

## 2021-01-02 NOTE — Progress Notes (Signed)
Office Visit Note   Patient: Mckenzie Rogers           Date of Birth: [birth year]           MRN: [id number] Visit Date: 01/02/2021 Requested by: Gaspar Garbe, MD [address] [place],  [place] [address] PCP: Wylene Simmer [name], MD  Subjective: Chief Complaint  Patient presents with  . Left Knee - Pain    HPI: Mckenzie Rogers is a 80 y.o. female who presents to the office complaining of bilateral knee pain left greater than right.  She returns following injection in January 2022.  She had 3 months of relief from that last injection in her left knee.  Describes medial and lateral left knee pain with no radiation, no radicular pain, no groin pain.  She reports she has not had any gel injections.  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  She request cortisone injection today..                ROS: All systems reviewed are negative as they relate to the chief complaint within the history of present illness.  Patient denies fevers or chills.  Assessment & Plan: Visit Diagnoses:  1. Unilateral primary osteoarthritis, left knee   2. Unilateral primary osteoarthritis, right knee     Plan: Patient is a 80 year old female who presents complaining of left knee pain primarily with some right knee pain as well.  She is here today for left knee cortisone injection.  Last injection provide about 3 months of relief.  She tolerated the cortisone injection today well without any complication.  A hinged knee brace was provided for her.  Patient will return in 3 months for bilateral gel injections.  She agreed with this plan.  This patient is diagnosed with osteoarthritis of the knee(s).    Radiographs show evidence of joint space narrowing, osteophytes, subchondral sclerosis and/or subchondral cysts.  This patient has knee pain which interferes with functional and activities of daily living.    This patient has experienced inadequate response, adverse effects and/or intolerance with  conservative treatments such as acetaminophen, NSAIDS, topical creams, physical therapy or regular exercise, knee bracing and/or weight loss.   This patient has experienced inadequate response or has a contraindication to intra articular steroid injections for at least 3 months.   This patient is not scheduled to have a total knee replacement within 6 months of starting treatment with viscosupplementation.   Follow-Up Instructions: No follow-ups on file.   Orders:  No orders of the defined types were placed in this encounter.  No orders of the defined types were placed in this encounter.     Procedures: Large Joint Inj: L knee on 01/02/2021 9:49 PM Indications: diagnostic evaluation, joint swelling and pain Details: 18 G 1.5 in needle, superolateral approach  Arthrogram: No  Medications: 5 mL lidocaine 1 %; 4 mL bupivacaine 0.25 %; 6 mg betamethasone acetate-betamethasone sodium phosphate 6 (3-3) MG/ML Outcome: tolerated well, no immediate complications Procedure, treatment alternatives, risks and benefits explained, specific risks discussed. Consent was given by the patient. Immediately prior to procedure a time out was called to verify the correct patient, procedure, equipment, support staff and site/side marked as required. Patient was prepped and draped in the usual sterile fashion.       Clinical Data: No additional findings.  Objective: Vital Signs: There were no vitals taken for this visit.  Physical Exam:  Constitutional: Patient appears well-developed HEENT:  Head: Normocephalic  Eyes:EOM are normal Neck: Normal range of motion Cardiovascular: Normal rate Pulmonary/chest: Effort normal Neurologic: Patient is alert Skin: Skin is warm Psychiatric: Patient has normal mood and affect  Ortho Exam: Ortho exam demonstrates left knee with 10 degree flexion contracture.  Flexes to 100 degrees.  Valgus deformity noted.  Tenderness over the medial and lateral joint lines.   Able to perform straight leg raise.  No pain with hip range of motion.  Specialty Comments:  No specialty comments available.  Imaging: No results found.   PMFS History: Patient Active Problem List   Diagnosis Date Noted  . Genetic testing 03/14/2020  . Family history of pancreatic cancer   . Family history of kidney cancer   . Family history of lung cancer    Past Medical History:  Diagnosis Date  . Diabetes (HCC)   . Edema   . Family history of kidney cancer   . Family history of lung cancer   . Family history of pancreatic cancer   . Osteoarthritis of both knees     Family History  Problem Relation Age of Onset  . Diabetes Maternal Aunt   . CVA Daughter   . Kidney failure Son   . Pancreatic cancer Father 41  . Kidney cancer Half-Sister 66       non-smoker  . Lung cancer Half-Sister 62       hx of smoking  . Cancer Half-Sister 77       unknown primary    Past Surgical History:  Procedure Laterality Date  . FOOT SURGERY Left   . TUBAL LIGATION     Social History   Occupational History  . Not on file  Tobacco Use  . Smoking status: Never Smoker  . Smokeless tobacco: Never Used  Substance and Sexual Activity  . Alcohol use: Not on file  . Drug use: Not on file  . Sexual activity: Not on file

## 2021-01-09 DIAGNOSIS — E119 Type 2 diabetes mellitus without complications: Secondary | ICD-10-CM | POA: Diagnosis not present

## 2021-01-16 ENCOUNTER — Telehealth: Payer: Self-pay | Admitting: Orthopedic Surgery

## 2021-01-16 NOTE — Telephone Encounter (Signed)
She has knee arthritis, taking oral OTC meds can help, stationary bike exercises can help, also can come into office for right knee injection if she wants, left knee only was injected last time

## 2021-01-16 NOTE — Telephone Encounter (Signed)
Tried calling to discuss. Greeting stated caller note accepting calls at this time.

## 2021-01-16 NOTE — Telephone Encounter (Signed)
Patient called. Says she would like to know what to do with the stiffness in her knee. Her call back number is [phone].

## 2021-01-19 ENCOUNTER — Telehealth: Payer: Self-pay | Admitting: Orthopedic Surgery

## 2021-01-19 NOTE — Telephone Encounter (Signed)
Recommended Tylenol with her history of kidney disease.  Would you be able to check on the status of her gel injections? Last cortisone only lasted 1 week

## 2021-01-19 NOTE — Telephone Encounter (Signed)
Patient called. She would like someone to call her. Would like some pain medication. Her call back number is [phone]

## 2021-01-20 NOTE — Telephone Encounter (Signed)
Tried calling to discuss. Greeting stating call could not be completed at this time. Will try to call patient again later.

## 2021-01-22 NOTE — Telephone Encounter (Signed)
Tried calling to discuss. Greeting stating call could not be completed at this time.

## 2021-01-30 ENCOUNTER — Other Ambulatory Visit: Payer: Self-pay

## 2021-01-30 ENCOUNTER — Telehealth: Payer: Self-pay

## 2021-01-30 ENCOUNTER — Ambulatory Visit (INDEPENDENT_AMBULATORY_CARE_PROVIDER_SITE_OTHER): Payer: Medicare HMO | Admitting: Surgical

## 2021-01-30 ENCOUNTER — Ambulatory Visit: Payer: Self-pay

## 2021-01-30 VITALS — Ht 63.0 in

## 2021-01-30 DIAGNOSIS — M1712 Unilateral primary osteoarthritis, left knee: Secondary | ICD-10-CM

## 2021-01-30 NOTE — Telephone Encounter (Signed)
Bilateral gel Injections

## 2021-02-03 NOTE — Telephone Encounter (Signed)
Noted  

## 2021-02-10 NOTE — Telephone Encounter (Signed)
Pt called asking if we have heard anything from her insurance

## 2021-02-10 NOTE — Telephone Encounter (Signed)
Called and left a Vm advising patient that she has been approved for gel injections and to CB to schedule with Dr. August Saucer.

## 2021-02-12 DIAGNOSIS — E1122 Type 2 diabetes mellitus with diabetic chronic kidney disease: Secondary | ICD-10-CM | POA: Diagnosis not present

## 2021-02-12 DIAGNOSIS — M8589 Other specified disorders of bone density and structure, multiple sites: Secondary | ICD-10-CM | POA: Diagnosis not present

## 2021-02-12 DIAGNOSIS — N182 Chronic kidney disease, stage 2 (mild): Secondary | ICD-10-CM | POA: Diagnosis not present

## 2021-02-12 DIAGNOSIS — Z0000 Encounter for general adult medical examination without abnormal findings: Secondary | ICD-10-CM | POA: Diagnosis not present

## 2021-02-12 DIAGNOSIS — E559 Vitamin D deficiency, unspecified: Secondary | ICD-10-CM | POA: Diagnosis not present

## 2021-02-19 DIAGNOSIS — Z1339 Encounter for screening examination for other mental health and behavioral disorders: Secondary | ICD-10-CM | POA: Diagnosis not present

## 2021-02-19 DIAGNOSIS — D692 Other nonthrombocytopenic purpura: Secondary | ICD-10-CM | POA: Diagnosis not present

## 2021-02-19 DIAGNOSIS — E1122 Type 2 diabetes mellitus with diabetic chronic kidney disease: Secondary | ICD-10-CM | POA: Diagnosis not present

## 2021-02-19 DIAGNOSIS — M170 Bilateral primary osteoarthritis of knee: Secondary | ICD-10-CM | POA: Diagnosis not present

## 2021-02-19 DIAGNOSIS — E559 Vitamin D deficiency, unspecified: Secondary | ICD-10-CM | POA: Diagnosis not present

## 2021-02-19 DIAGNOSIS — Z0000 Encounter for general adult medical examination without abnormal findings: Secondary | ICD-10-CM | POA: Diagnosis not present

## 2021-02-19 DIAGNOSIS — Z1331 Encounter for screening for depression: Secondary | ICD-10-CM | POA: Diagnosis not present

## 2021-02-19 DIAGNOSIS — N182 Chronic kidney disease, stage 2 (mild): Secondary | ICD-10-CM | POA: Diagnosis not present

## 2021-02-19 DIAGNOSIS — M8580 Other specified disorders of bone density and structure, unspecified site: Secondary | ICD-10-CM | POA: Diagnosis not present

## 2021-02-19 DIAGNOSIS — I129 Hypertensive chronic kidney disease with stage 1 through stage 4 chronic kidney disease, or unspecified chronic kidney disease: Secondary | ICD-10-CM | POA: Diagnosis not present

## 2021-02-19 DIAGNOSIS — Z794 Long term (current) use of insulin: Secondary | ICD-10-CM | POA: Diagnosis not present

## 2021-02-19 DIAGNOSIS — F321 Major depressive disorder, single episode, moderate: Secondary | ICD-10-CM | POA: Diagnosis not present

## 2021-02-20 ENCOUNTER — Encounter: Payer: Self-pay | Admitting: Surgical

## 2021-02-20 ENCOUNTER — Ambulatory Visit (INDEPENDENT_AMBULATORY_CARE_PROVIDER_SITE_OTHER): Payer: Medicare HMO | Admitting: Surgical

## 2021-02-20 DIAGNOSIS — M170 Bilateral primary osteoarthritis of knee: Secondary | ICD-10-CM | POA: Diagnosis not present

## 2021-02-20 DIAGNOSIS — M1711 Unilateral primary osteoarthritis, right knee: Secondary | ICD-10-CM

## 2021-02-20 DIAGNOSIS — M1712 Unilateral primary osteoarthritis, left knee: Secondary | ICD-10-CM

## 2021-02-20 MED ORDER — HYALURONAN 88 MG/4ML IX SOSY
88.0000 mg | PREFILLED_SYRINGE | INTRA_ARTICULAR | Status: AC | PRN
  Administered 2021-02-20: 88 mg via INTRA_ARTICULAR

## 2021-02-20 MED ORDER — LIDOCAINE HCL 1 % IJ SOLN
5.0000 mL | INTRAMUSCULAR | Status: AC | PRN
Start: 2021-02-20 — End: 2021-02-20
  Administered 2021-02-20: 5 mL

## 2021-02-20 MED ORDER — HYALURONAN 88 MG/4ML IX SOSY
88.0000 mg | PREFILLED_SYRINGE | INTRA_ARTICULAR | Status: AC | PRN
Start: 2021-02-20 — End: 2021-02-20
  Administered 2021-02-20: 88 mg via INTRA_ARTICULAR

## 2021-02-20 MED ORDER — LIDOCAINE HCL 1 % IJ SOLN
5.0000 mL | INTRAMUSCULAR | Status: AC | PRN
  Administered 2021-02-20: 5 mL

## 2021-02-20 NOTE — Progress Notes (Signed)
   Procedure Note  Patient: Mckenzie Rogers             Date of Birth: July 25, 1941           MRN: [id number]             Visit Date: 02/20/2021  Procedures: Visit Diagnoses:  1. Unilateral primary osteoarthritis, left knee   2. Unilateral primary osteoarthritis, right knee     Large Joint Inj: bilateral knee on 02/20/2021 3:50 PM Indications: pain, joint swelling and diagnostic evaluation Details: 18 G 1.5 in needle, superolateral approach  Arthrogram: No  Medications (Right): 5 mL lidocaine 1 %; 88 mg Hyaluronan 88 MG/4ML Medications (Left): 5 mL lidocaine 1 %; 88 mg Hyaluronan 88 MG/4ML Outcome: tolerated well, no immediate complications Procedure, treatment alternatives, risks and benefits explained, specific risks discussed. Consent was given by the patient. Immediately prior to procedure a time out was called to verify the correct patient, procedure, equipment, support staff and site/side marked as required. Patient was prepped and draped in the usual sterile fashion.

## 2021-02-21 ENCOUNTER — Encounter: Payer: Self-pay | Admitting: Surgical

## 2021-02-21 NOTE — Progress Notes (Signed)
Office Visit Note   Patient: Mckenzie Rogers           Date of Birth: 1941/06/04           MRN: [id number] Visit Date: 01/30/2021 Requested by: Gaspar Garbe, MD [address] [place],  [place] [address] PCP: Wylene Simmer [name], MD  Subjective: Chief Complaint  Patient presents with   Left Knee - Pain    HPI: Mckenzie Rogers is a 80 y.o. female who presents to the office complaining of left knee pain.  Patient complains of continued left knee pain.  She had left knee injection on 01/02/2021 that provided about 2 weeks of relief.  However now her pain is worse than before and she has constant pain that is worse with walking and standing.  She has radicular pain that travels up to her left buttocks with tingling in the distal left lower extremity.  Denies any groin pain.  She walks for exercise.  She has 15-minute walking endurance.  She works as a Engineer, site.  She wakes with pain at night.  She does not want surgery.  She lives at home with her son and her daughter has history of multiple strokes..                ROS: All systems reviewed are negative as they relate to the chief complaint within the history of present illness.  Patient denies fevers or chills.  Assessment & Plan: Visit Diagnoses:  1. Unilateral primary osteoarthritis, left knee     Plan: Patient is a 80 year old female who presents complaining of left knee pain.  She has a history of left knee osteoarthritis.  She has had good relief from left knee injection but it only lasted for 2 weeks.  She has now complained of increased pain.  Unfortunately, no options are available for repeat injection at this time given how recent her injection was.  Plan to continue with gel injection approval process and she may follow-up for left knee gel injection after that is approved.  After that she may follow-up 3 months after that gel injection for another cortisone injection.  At this time she does not want  surgery.  Follow-Up Instructions: No follow-ups on file.   Orders:  Orders Placed This Encounter  Procedures   XR Knee 1-2 Views Left   No orders of the defined types were placed in this encounter.     Procedures: No procedures performed   Clinical Data: No additional findings.  Objective: Vital Signs: Ht 5\' 3"  (1.6 m)   BMI 34.90 kg/m   Physical Exam:  Constitutional: Patient appears well-developed HEENT:  Head: Normocephalic Eyes:EOM are normal Neck: Normal range of motion Cardiovascular: Normal rate Pulmonary/chest: Effort normal Neurologic: Patient is alert Skin: Skin is warm Psychiatric: Patient has normal mood and affect  Ortho Exam: Ortho exam demonstrates left knee with extension to 15 degrees and flexion to 90 degrees.  Right knee with extension to 10 degrees and flexion to 95 degrees.  Small effusion present bilaterally.  Tenderness over the medial lateral joint lines bilaterally.  Able to perform straight leg raise weakly but without extensor lag.  No pain with hip range of motion.  No calf tenderness, negative Homans' sign bilaterally.  Specialty Comments:  No specialty comments available.  Imaging: No results found.   PMFS History: Patient Active Problem List   Diagnosis Date Noted   Genetic testing 03/14/2020   Family history of pancreatic cancer  Family history of kidney cancer    Family history of lung cancer    Past Medical History:  Diagnosis Date   Diabetes (HCC)    Edema    Family history of kidney cancer    Family history of lung cancer    Family history of pancreatic cancer    Osteoarthritis of both knees     Family History  Problem Relation Age of Onset   Diabetes Maternal Aunt    CVA Daughter    Kidney failure Son    Pancreatic cancer Father 102   Kidney cancer Half-Sister 60       non-smoker   Lung cancer Half-Sister 49       hx of smoking   Cancer Half-Sister 41       unknown primary    Past Surgical History:   Procedure Laterality Date   FOOT SURGERY Left    TUBAL LIGATION     Social History   Occupational History   Not on file  Tobacco Use   Smoking status: Never   Smokeless tobacco: Never  Substance and Sexual Activity   Alcohol use: Not on file   Drug use: Not on file   Sexual activity: Not on file

## 2021-04-03 ENCOUNTER — Ambulatory Visit: Payer: Medicare HMO | Admitting: Orthopedic Surgery

## 2021-04-15 DIAGNOSIS — I129 Hypertensive chronic kidney disease with stage 1 through stage 4 chronic kidney disease, or unspecified chronic kidney disease: Secondary | ICD-10-CM | POA: Diagnosis not present

## 2021-04-15 DIAGNOSIS — R609 Edema, unspecified: Secondary | ICD-10-CM | POA: Diagnosis not present

## 2021-04-15 DIAGNOSIS — N952 Postmenopausal atrophic vaginitis: Secondary | ICD-10-CM | POA: Diagnosis not present

## 2021-04-15 DIAGNOSIS — R310 Gross hematuria: Secondary | ICD-10-CM | POA: Diagnosis not present

## 2021-04-15 DIAGNOSIS — N182 Chronic kidney disease, stage 2 (mild): Secondary | ICD-10-CM | POA: Diagnosis not present

## 2021-04-15 DIAGNOSIS — R3915 Urgency of urination: Secondary | ICD-10-CM | POA: Diagnosis not present

## 2021-04-15 DIAGNOSIS — R350 Frequency of micturition: Secondary | ICD-10-CM | POA: Diagnosis not present

## 2021-04-24 ENCOUNTER — Ambulatory Visit: Payer: Medicare HMO | Admitting: Podiatry

## 2021-04-24 DIAGNOSIS — R310 Gross hematuria: Secondary | ICD-10-CM | POA: Diagnosis not present

## 2021-05-21 DIAGNOSIS — R310 Gross hematuria: Secondary | ICD-10-CM | POA: Diagnosis not present

## 2021-05-21 DIAGNOSIS — R350 Frequency of micturition: Secondary | ICD-10-CM | POA: Diagnosis not present

## 2021-05-21 DIAGNOSIS — N952 Postmenopausal atrophic vaginitis: Secondary | ICD-10-CM | POA: Diagnosis not present

## 2021-05-21 DIAGNOSIS — R3915 Urgency of urination: Secondary | ICD-10-CM | POA: Diagnosis not present

## 2021-05-21 DIAGNOSIS — N200 Calculus of kidney: Secondary | ICD-10-CM | POA: Diagnosis not present

## 2021-05-28 ENCOUNTER — Ambulatory Visit (INDEPENDENT_AMBULATORY_CARE_PROVIDER_SITE_OTHER): Payer: Medicare HMO | Admitting: Orthopedic Surgery

## 2021-05-28 ENCOUNTER — Other Ambulatory Visit: Payer: Self-pay

## 2021-05-28 DIAGNOSIS — M1712 Unilateral primary osteoarthritis, left knee: Secondary | ICD-10-CM | POA: Diagnosis not present

## 2021-05-28 DIAGNOSIS — M170 Bilateral primary osteoarthritis of knee: Secondary | ICD-10-CM | POA: Diagnosis not present

## 2021-05-28 DIAGNOSIS — M1711 Unilateral primary osteoarthritis, right knee: Secondary | ICD-10-CM | POA: Diagnosis not present

## 2021-05-29 ENCOUNTER — Encounter: Payer: Self-pay | Admitting: Orthopedic Surgery

## 2021-05-29 DIAGNOSIS — M1712 Unilateral primary osteoarthritis, left knee: Secondary | ICD-10-CM | POA: Diagnosis not present

## 2021-05-29 DIAGNOSIS — M170 Bilateral primary osteoarthritis of knee: Secondary | ICD-10-CM | POA: Diagnosis not present

## 2021-05-29 DIAGNOSIS — M1711 Unilateral primary osteoarthritis, right knee: Secondary | ICD-10-CM | POA: Diagnosis not present

## 2021-05-29 MED ORDER — METHYLPREDNISOLONE ACETATE 40 MG/ML IJ SUSP
40.0000 mg | INTRAMUSCULAR | Status: AC | PRN
  Administered 2021-05-29: 40 mg via INTRA_ARTICULAR

## 2021-05-29 MED ORDER — BUPIVACAINE HCL 0.25 % IJ SOLN
4.0000 mL | INTRAMUSCULAR | Status: AC | PRN
  Administered 2021-05-29: 4 mL via INTRA_ARTICULAR

## 2021-05-29 MED ORDER — LIDOCAINE HCL 1 % IJ SOLN
5.0000 mL | INTRAMUSCULAR | Status: AC | PRN
  Administered 2021-05-29: 5 mL

## 2021-05-29 NOTE — Progress Notes (Signed)
Office Visit Note   Patient: Mckenzie Rogers           Date of Birth: 1940/12/24           MRN: [id number] Visit Date: 05/28/2021 Requested by: Gaspar Garbe, MD [address] [place],  [place] [address] PCP: Wylene Simmer [name], MD  Subjective: Chief Complaint  Patient presents with   Right Knee - Pain   Left Knee - Pain    HPI: Patient presents with bilateral knee pain left worse than right.  Hard for her to sleep.  She states that she can walk a distance but is not using any assistive devices.  It is particularly difficult for her to get up from a seated position because of the stiffness in her knees.  She has a good idea about how patients do with knee replacements because she has worked in rehab in the past.  She had bilateral knee injections in June 3 months ago which did help.  No interval hospitalizations or surgeries.              ROS: All systems reviewed are negative as they relate to the chief complaint within the history of present illness.  Patient denies  fevers or chills.   Assessment & Plan: Visit Diagnoses:  1. Unilateral primary osteoarthritis, left knee   2. Unilateral primary osteoarthritis, right knee     Plan: Impression is bilateral knee arthritis left worse than right.  Slight flexion contractures in both knees.  In general she does appear to have difficulty with mobility.  She is not really ready to think about knee replacement yet.  Gel injections have not been as helpful for her in the past as cortisone injections.  Bilateral knee aspiration and injection performed today.  Follow-up in 4 months for clinical recheck.  We did talk a lot today about how some people do extremely well with knee replacements and how some struggle to get their motion and movement back.  She is going to consider her options and we will reassess in 4 months.  Follow-Up Instructions: Return in about 4 months (around 09/27/2021).   Orders:  No orders of the defined types were  placed in this encounter.  No orders of the defined types were placed in this encounter.     Procedures: Large Joint Inj: R knee on 05/29/2021 6:53 AM Indications: diagnostic evaluation, joint swelling and pain Details: 18 G 1.5 in needle, superolateral approach  Arthrogram: No  Medications: 5 mL lidocaine 1 %; 40 mg methylPREDNISolone acetate 40 MG/ML; 4 mL bupivacaine 0.25 % Outcome: tolerated well, no immediate complications Procedure, treatment alternatives, risks and benefits explained, specific risks discussed. Consent was given by the patient. Immediately prior to procedure a time out was called to verify the correct patient, procedure, equipment, support staff and site/side marked as required. Patient was prepped and draped in the usual sterile fashion.    Large Joint Inj: L knee on 05/29/2021 6:53 AM Indications: diagnostic evaluation, joint swelling and pain Details: 18 G 1.5 in needle, superolateral approach  Arthrogram: No  Medications: 5 mL lidocaine 1 %; 40 mg methylPREDNISolone acetate 40 MG/ML; 4 mL bupivacaine 0.25 % Outcome: tolerated well, no immediate complications Procedure, treatment alternatives, risks and benefits explained, specific risks discussed. Consent was given by the patient. Immediately prior to procedure a time out was called to verify the correct patient, procedure, equipment, support staff and site/side marked as required. Patient was prepped and draped in the usual  sterile fashion.      Clinical Data: No additional findings.  Objective: Vital Signs: There were no vitals taken for this visit.  Physical Exam:   Constitutional: Patient appears well-developed HEENT:  Head: Normocephalic Eyes:EOM are normal Neck: Normal range of motion Cardiovascular: Normal rate Pulmonary/chest: Effort normal Neurologic: Patient is alert Skin: Skin is warm Psychiatric: Patient has normal mood and affect   Ortho Exam: Ortho exam demonstrates mild edema  bilateral lower extremities with palpable pedal pulses.  No groin pain with internal and external rotation of either leg.  More than 5 but less than 10 degree flexion contracture in both knees.  Extensor mechanism intact.  Trace effusion present in both knees.  Specialty Comments:  No specialty comments available.  Imaging: No results found.   PMFS History: Patient Active Problem List   Diagnosis Date Noted   Genetic testing 03/14/2020   Family history of pancreatic cancer    Family history of kidney cancer    Family history of lung cancer    Past Medical History:  Diagnosis Date   Diabetes (HCC)    Edema    Family history of kidney cancer    Family history of lung cancer    Family history of pancreatic cancer    Osteoarthritis of both knees     Family History  Problem Relation Age of Onset   Diabetes Maternal Aunt    CVA Daughter    Kidney failure Son    Pancreatic cancer Father 36   Kidney cancer Half-Sister 76       non-smoker   Lung cancer Half-Sister 50       hx of smoking   Cancer Half-Sister 39       unknown primary    Past Surgical History:  Procedure Laterality Date   FOOT SURGERY Left    TUBAL LIGATION     Social History   Occupational History   Not on file  Tobacco Use   Smoking status: Never   Smokeless tobacco: Never  Substance and Sexual Activity   Alcohol use: Not on file   Drug use: Not on file   Sexual activity: Not on file

## 2021-06-04 DIAGNOSIS — R3915 Urgency of urination: Secondary | ICD-10-CM | POA: Diagnosis not present

## 2021-06-04 DIAGNOSIS — R350 Frequency of micturition: Secondary | ICD-10-CM | POA: Diagnosis not present

## 2021-06-11 DIAGNOSIS — R350 Frequency of micturition: Secondary | ICD-10-CM | POA: Diagnosis not present

## 2021-06-25 DIAGNOSIS — R350 Frequency of micturition: Secondary | ICD-10-CM | POA: Diagnosis not present

## 2021-07-10 ENCOUNTER — Ambulatory Visit: Payer: Medicare HMO | Admitting: Podiatry

## 2021-07-23 DIAGNOSIS — R350 Frequency of micturition: Secondary | ICD-10-CM | POA: Diagnosis not present

## 2021-07-30 DIAGNOSIS — R350 Frequency of micturition: Secondary | ICD-10-CM | POA: Diagnosis not present

## 2021-07-31 ENCOUNTER — Other Ambulatory Visit: Payer: Self-pay

## 2021-07-31 ENCOUNTER — Ambulatory Visit: Payer: Medicare HMO | Admitting: Podiatry

## 2021-07-31 DIAGNOSIS — M79675 Pain in left toe(s): Secondary | ICD-10-CM

## 2021-07-31 DIAGNOSIS — B351 Tinea unguium: Secondary | ICD-10-CM | POA: Diagnosis not present

## 2021-07-31 DIAGNOSIS — M2141 Flat foot [pes planus] (acquired), right foot: Secondary | ICD-10-CM

## 2021-07-31 DIAGNOSIS — M2011 Hallux valgus (acquired), right foot: Secondary | ICD-10-CM

## 2021-07-31 DIAGNOSIS — M79674 Pain in right toe(s): Secondary | ICD-10-CM

## 2021-07-31 DIAGNOSIS — E119 Type 2 diabetes mellitus without complications: Secondary | ICD-10-CM | POA: Insufficient documentation

## 2021-07-31 NOTE — Patient Instructions (Signed)
Call after Thanksgiving to schedule diabetic shoe appointment for January.   Diabetes Mellitus and Foot Care Foot care is an important part of your health, especially when you have diabetes. Diabetes may cause you to have problems because of poor blood flow (circulation) to your feet and legs, which can cause your skin to: Become thinner and drier. Break more easily. Heal more slowly. Peel and crack. You may also have nerve damage (neuropathy) in your legs and feet, causing decreased feeling in them. This means that you may not notice minor injuries to your feet that could lead to more serious problems. Noticing and addressing any potential problems early is the best way to prevent future foot problems. How to care for your feet Foot hygiene  Wash your feet daily with warm water and mild soap. Do not use hot water. Then, pat your feet and the areas between your toes until they are completely dry. Do not soak your feet as this can dry your skin. Trim your toenails straight across. Do not dig under them or around the cuticle. File the edges of your nails with an emery board or nail file. Apply a moisturizing lotion or petroleum jelly to the skin on your feet and to dry, brittle toenails. Use lotion that does not contain alcohol and is unscented. Do not apply lotion between your toes. Shoes and socks Wear clean socks or stockings every day. Make sure they are not too tight. Do not wear knee-high stockings since they may decrease blood flow to your legs. Wear shoes that fit properly and have enough cushioning. Always look in your shoes before you put them on to be sure there are no objects inside. To break in new shoes, wear them for just a few hours a day. This prevents injuries on your feet. Wounds, scrapes, corns, and calluses  Check your feet daily for blisters, cuts, bruises, sores, and redness. If you cannot see the bottom of your feet, use a mirror or ask someone for help. Do not cut corns  or calluses or try to remove them with medicine. If you find a minor scrape, cut, or break in the skin on your feet, keep it and the skin around it clean and dry. You may clean these areas with mild soap and water. Do not clean the area with peroxide, alcohol, or iodine. If you have a wound, scrape, corn, or callus on your foot, look at it several times a day to make sure it is healing and not infected. Check for: Redness, swelling, or pain. Fluid or blood. Warmth. Pus or a bad smell. General tips Do not cross your legs. This may decrease blood flow to your feet. Do not use heating pads or hot water bottles on your feet. They may burn your skin. If you have lost feeling in your feet or legs, you may not know this is happening until it is too late. Protect your feet from hot and cold by wearing shoes, such as at the beach or on hot pavement. Schedule a complete foot exam at least once a year (annually) or more often if you have foot problems. Report any cuts, sores, or bruises to your health care provider immediately. Where to find more information American Diabetes Association: www.diabetes.org Association of Diabetes Care & Education Specialists: www.diabeteseducator.org Contact a health care provider if: You have a medical condition that increases your risk of infection and you have any cuts, sores, or bruises on your feet. You have an injury that  is not healing. You have redness on your legs or feet. You feel burning or tingling in your legs or feet. You have pain or cramps in your legs and feet. Your legs or feet are numb. Your feet always feel cold. You have pain around any toenails. Get help right away if: You have a wound, scrape, corn, or callus on your foot and: You have pain, swelling, or redness that gets worse. You have fluid or blood coming from the wound, scrape, corn, or callus. Your wound, scrape, corn, or callus feels warm to the touch. You have pus or a bad smell coming  from the wound, scrape, corn, or callus. You have a fever. You have a red line going up your leg. Summary Check your feet every day for blisters, cuts, bruises, sores, and redness. Apply a moisturizing lotion or petroleum jelly to the skin on your feet and to dry, brittle toenails. Wear shoes that fit properly and have enough cushioning. If you have foot problems, report any cuts, sores, or bruises to your health care provider immediately. Schedule a complete foot exam at least once a year (annually) or more often if you have foot problems. This information is not intended to replace advice given to you by your health care provider. Make sure you discuss any questions you have with your health care provider. Document Revised: 03/20/2020 Document Reviewed: 03/20/2020 Elsevier Patient Education  2022 [employer]  Flat Feet, Adult Flat feet is a common condition in which there is no curve, or arch, on the inner sides of the feet. Normally, an adult foot has an arch. The arch creates a gap between the foot and the ground. This condition can occur in one foot or in both feet. What are the causes? This condition may be caused by: An injury to tendons and ligaments in the foot, such as to the tendon that supports the arch (posterior tibial tendon). Tendons are tissues that connect muscle to bone, and ligaments are tissues that connect bones to each other. Loose tendons or ligaments in your foot. A wearing down of your arch over time. Injury to bones in your foot. An abnormality in the bones of your foot called tarsal coalition. This happens when two or more bones in the foot are joined together (fused). Tarsal coalition is often present at birth, but signs of it typically do not show until the early teen years. What increases the risk? The following factors may make you more likely to develop this condition: Being an adult age 30 or older. Having a family history of flat feet or a history of  childhood flexible flatfoot. Having obesity, diabetes, or high blood pressure. Taking part in high-impact sports. Having inflammatory arthritis. Having had any of these problems with the bones in your foot: A broken bone (fracture). Bones that were moved out of place (dislocated). Achilles tendon injuries. What are the signs or symptoms? Symptoms of this condition include: Pain or tightness along the bottom of your foot. Foot pain that gets worse with activity. Swelling of the inner side of your foot or swelling of your ankle. Pain on the outer side of your ankle. Changes in gait. Your gait is the way that you walk. Pronation. This is when the foot and ankle lean inward when you are standing. Bony bumps on the top or inner side of your foot. How is this diagnosed? This condition is diagnosed with a physical exam of your foot and ankle. Your health care provider may  also: Check your shoes for patterns of wear on the soles. Order imaging tests, such as X-rays to see the bone structure. Refer you to a health care provider who specializes in feet (podiatrist). How is this treated? This condition may be treated with: Rest and ice to decrease inflammation and pain in the affected foot. Stretching or physical therapy exercises. These are done to: Improve movement and strength in your foot. Increase range of motion and relieve pain. A shoe insert (orthotic insert) for one foot or both feet. This helps to support the arch of your foot. An orthotic insert or inserts can be purchased from a store or can be custom-made. Wearing shoes with appropriate arch support. This is especially important for athletes. Medicines. These may be prescribed or injected into the affected foot to relieve pain. An ankle boot or cast or a foot or leg brace to relieve pressure on your affected foot. Surgery. This may be done to improve the alignment of your foot. Surgery is needed only if your posterior tibial tendon  is torn or if you have tarsal coalition. Follow these instructions at home: Managing pain, stiffness, and swelling If directed, put ice on the painful area. To do this: If you have a removable brace or boot, remove it as told by your health care provider. Put ice in a plastic bag. Place a towel between your skin and the bag or between your cast and the bag. Leave the ice on for 20 minutes, 2-3 times a day. Remove the ice if your skin turns bright red. This is very important. If you cannot feel pain, heat, or cold, you have a greater risk of damage to the area. Move your toes often to reduce stiffness and swelling. Raise (elevate) the injured area above the level of your heart while you are sitting or lying down. Activity Do exercises as told by your health care provider, if they were prescribed. If an activity causes pain, avoid it or try to find another activity that does not cause pain. General instructions Wear an orthotic insert and appropriate shoes as told by your health care provider. Take over-the-counter and prescription medicines only as told by your health care provider. Wear a brace, boot, or cast as told by your health care provider. Keep all follow-up visits. This is important. How is this prevented? To prevent the condition from getting worse: Wear comfortable, supportive shoes that are appropriate for your activities. Maintain a healthy weight. Stay active in a way that your health care provider recommends. This will help to keep your feet flexible and strong. Manage long-term (chronic) health conditions, such as diabetes, high blood pressure, and inflammatory arthritis. Work with a health care provider if you have concerns about your feet or shoes. Contact a health care provider if you have: Pain in your foot or lower leg that gets worse or does not improve with medicine. Pain or trouble when walking. Problems with your orthotic insert. Summary Flat feet is a common  condition in which there is no curve, or arch, on the inner sides of the feet. This condition can occur in one foot or in both feet. Your health care provider may recommend a shoe insert (orthotic insert) for one foot or both feet, or shoes with the appropriate arch support. Other treatments may include stretching or physical therapy exercises, pain medicines, and wearing a foot or leg brace or an ankle boot or cast. Surgery may be done if you have a tear in the  tendon that supports your arch (posterior tibial tendon) or if two or more of your foot bones were joined together, or fused,before birth (tarsal coalition). This information is not intended to replace advice given to you by your health care provider. Make sure you discuss any questions you have with your health care provider. Document Revised: 02/07/2020 Document Reviewed: 02/07/2020 Elsevier Patient Education  2022 [employer]  Corns and Calluses Corns are small areas of thickened skin that form on the top, sides, or tip of a toe. Corns have a cone-shaped core with a point that can press on a nerve below. This causes pain. Calluses are areas of thickened skin that can form anywhere on the body, including the hands, fingers, palms, soles of the feet, and heels. Calluses are usually larger than corns. What are the causes? Corns and calluses are caused by rubbing (friction) or pressure, such as from shoes that are too tight or do not fit properly. What increases the risk? Corns are more likely to develop in people who have misshapen toes (toe deformities), such as hammer toes. Calluses can form with friction to any area of the skin. They are more likely to develop in people who: Work with their hands. Wear shoes that fit poorly, are too tight, or are high-heeled. Have toe deformities. What are the signs or symptoms? Symptoms of a corn or callus include: A hard growth on the skin. Pain or tenderness under the skin. Redness and  swelling. Increased discomfort while wearing tight-fitting shoes, if your feet are affected. If a corn or callus becomes infected, symptoms may include: Redness and swelling that gets worse. Pain. Fluid, blood, or pus draining from the corn or callus. How is this diagnosed? Corns and calluses may be diagnosed based on your symptoms, your medical history, and a physical exam. How is this treated? Treatment for corns and calluses may include: Removing the cause of the friction or pressure. This may involve: Changing your shoes. Wearing shoe inserts (orthotics) or other protective layers in your shoes, such as a corn pad. Wearing gloves. Applying medicine to the skin (topical medicine) to help soften skin in the hardened, thickened areas. Removing layers of dead skin with a file to reduce the size of the corn or callus. Removing the corn or callus with a scalpel or laser. Taking antibiotic medicines, if your corn or callus is infected. Having surgery, if a toe deformity is the cause. Follow these instructions at home:  Take over-the-counter and prescription medicines only as told by your health care provider. If you were prescribed an antibiotic medicine, take it as told by your health care provider. Do not stop taking it even if your condition improves. Wear shoes that fit well. Avoid wearing high-heeled shoes and shoes that are too tight or too loose. Wear any padding, protective layers, gloves, or orthotics as told by your health care provider. Soak your hands or feet. Then use a file or pumice stone to soften your corn or callus. Do this as told by your health care provider. Check your corn or callus every day for signs of infection. Contact a health care provider if: Your symptoms do not improve with treatment. You have redness or swelling that gets worse. Your corn or callus becomes painful. You have fluid, blood, or pus coming from your corn or callus. You have new symptoms. Get  help right away if: You develop severe pain with redness. Summary Corns are small areas of thickened skin that form on the  top, sides, or tip of a toe. These can be painful. Calluses are areas of thickened skin that can form anywhere on the body, including the hands, fingers, palms, and soles of the feet. Calluses are usually larger than corns. Corns and calluses are caused by rubbing (friction) or pressure, such as from shoes that are too tight or do not fit properly. Treatment may include wearing padding, protective layers, gloves, or orthotics as told by your health care provider. This information is not intended to replace advice given to you by your health care provider. Make sure you discuss any questions you have with your health care provider. Document Revised: 12/27/2019 Document Reviewed: 12/27/2019 Elsevier Patient Education  2022 [employer].

## 2021-07-31 NOTE — Progress Notes (Addendum)
ANNUAL DIABETIC FOOT EXAM  Subjective: Mckenzie Rogers presents today for for annual diabetic foot examination.  Patient relates 4 year h/o diabetes.  Patient denies any h/o foot wounds.  Patient has been diagnosed with neuropathy.  Patient's blood sugar was 128 mg/dl today.   [name], Fransico Him, MD is patient's PCP. Last visit was 04/15/2021.  Past Medical History:  Diagnosis Date   Diabetes ([place])    Edema    Family history of kidney cancer    Family history of lung cancer    Family history of pancreatic cancer    Osteoarthritis of both knees    Patient Active Problem List   Diagnosis Date Noted   Controlled type 2 diabetes mellitus without complication, without long-term current use of insulin ([place]) 07/31/2021   Genetic testing 03/14/2020   Family history of pancreatic cancer    Family history of kidney cancer    Family history of lung cancer    Past Surgical History:  Procedure Laterality Date   FOOT SURGERY Left    TUBAL LIGATION     Current Outpatient Medications on File Prior to Visit  Medication Sig Dispense Refill   ACCU-CHEK AVIVA PLUS test strip      albuterol (PROVENTIL HFA;VENTOLIN HFA) 108 (90 Base) MCG/ACT inhaler Inhale 1-2 puffs into the lungs every 6 (six) hours as needed for wheezing or shortness of breath. (Patient not taking: Reported on 09/28/2018) 1 Inhaler 0   atorvastatin (LIPITOR) 80 MG tablet Take 80 mg by mouth daily.     benzonatate (TESSALON) 100 MG capsule Take 1 capsule (100 mg total) by mouth every 8 (eight) hours. (Patient not taking: Reported on 09/28/2018) 9 capsule 0   Blood Glucose Monitoring Suppl (TRUE METRIX METER) w/Device KIT      escitalopram (LEXAPRO) 10 MG tablet      estradiol (ESTRACE) 0.1 MG/GM vaginal cream      FARXIGA 10 MG TABS tablet      fluconazole (DIFLUCAN) 150 MG tablet      fluticasone (FLONASE) 50 MCG/ACT nasal spray Place 1 spray into both nostrils daily.     insulin glargine (LANTUS) 100 UNIT/ML injection  Inject 28 Units into the skin daily.     Lancets (ONETOUCH DELICA PLUS [id number]) MISC      meloxicam (MOBIC) 15 MG tablet Take 15 mg by mouth daily.     MYRBETRIQ 25 MG TB24 tablet      OZEMPIC, 1 MG/DOSE, 2 MG/1.5ML SOPN      TOVIAZ 4 MG TB24 tablet      TRESIBA FLEXTOUCH 100 UNIT/ML FlexTouch Pen      umeclidinium-vilanterol (ANORO ELLIPTA) 62.5-25 MCG/INH AEPB Inhale 1 puff into the lungs daily.     valsartan (DIOVAN) 80 MG tablet      VITAMIN D PO Take 2,000 Units by mouth daily.     Vitamin D, Ergocalciferol, (DRISDOL) 1.25 MG (50000 UT) CAPS capsule Take 50,000 Units by mouth every 7 (seven) days.     No current facility-administered medications on file prior to visit.    Allergies  Allergen Reactions   Aspirin Other (See Comments)   Clorazepate Dipotassium Other (See Comments)   Ketoprofen Other (See Comments)   Oxycodone-Aspirin     REACTION: tongue swells   Social History   Occupational History   Not on file  Tobacco Use   Smoking status: Never   Smokeless tobacco: Never  Substance and Sexual Activity   Alcohol use: Not on file   Drug use:  Not on file   Sexual activity: Not on file   Family History  Problem Relation Age of Onset   Diabetes Maternal Aunt    CVA Daughter    Kidney failure Son    Pancreatic cancer Father 57   Kidney cancer Half-Sister 11       non-smoker   Lung cancer Half-Sister 31       hx of smoking   Cancer Half-Sister 30       unknown primary    There is no immunization history on file for this patient.   Review of Systems: Negative except as noted in the HPI.   Objective: There were no vitals filed for this visit.  Mckenzie Rogers is a pleasant 80 y.o. female in NAD. AAO X 3.  Vascular Examination: CFT immediate b/l LE. Palpable DP/PT pulses b/l LE. Digital hair present b/l. Skin temperature gradient WNL b/l. No pain with calf compression b/l. No edema noted b/l. No cyanosis or clubbing noted b/l LE.  Dermatological  Examination: Pedal integument with normal turgor, texture and tone b/l LE. No open wounds b/l. No interdigital macerations b/l. Toenails 1-5 b/l elongated, thickened, discolored with subungual debris. +Tenderness with dorsal palpation of nailplates. No hyperkeratotic or porokeratotic lesions present.  Musculoskeletal Examination: Normal muscle strength 5/5 to all lower extremity muscle groups bilaterally. HAV with bunion deformity noted b/l LE. Pes planus deformity noted bilateral LE.Marland Kitchen No pain, crepitus or joint limitation noted with ROM b/l LE.  Patient ambulates independently without assistive aids.  Footwear Assessment: Does the patient wear appropriate shoes? Yes. Does the patient need inserts/orthotics? No.  Neurological Examination: Protective sensation intact 5/5 intact bilaterally with 10g monofilament b/l. Vibratory sensation intact b/l.  Assessment: 1. Pain due to onychomycosis of toenails of both feet   2. Hallux valgus, acquired, bilateral   3. Pes planus of both feet   4. Controlled type 2 diabetes mellitus without complication, without long-term current use of insulin ([place])   5. Encounter for diabetic foot exam ([place])     ADA Risk Categorization: Low Risk :  Patient has all of the following: Intact protective sensation No prior foot ulcer  No severe deformity Pedal pulses present  Plan: -Examined patient. -Diabetic foot examination performed today. -Continue foot and shoe inspections daily. Monitor blood glucose per PCP/Endocrinologist's recommendations. -Patient to continue soft, supportive shoe gear daily. Start procedure for diabetic shoes. Patient qualifies based on diagnoses. -Mycotic toenails 1-5 bilaterally were debrided in length and girth with sterile nail nippers and dremel without incident. -Patient/POA to call should there be question/concern in the interim.  Return in about 3 months (around 10/31/2021).  Mckenzie Rogers, DPM

## 2021-08-03 ENCOUNTER — Ambulatory Visit: Payer: Medicare HMO | Admitting: Orthopedic Surgery

## 2021-08-05 ENCOUNTER — Encounter: Payer: Self-pay | Admitting: Podiatry

## 2021-08-13 ENCOUNTER — Telehealth: Payer: Self-pay | Admitting: Podiatry

## 2021-08-13 NOTE — Telephone Encounter (Signed)
Pt left message asking about an appt to be fitted for diabetic shoes.  I returned call to cell and it is the wrong number, I then called the home and no voicemail is set up.

## 2021-08-19 DIAGNOSIS — Z20822 Contact with and (suspected) exposure to covid-19: Secondary | ICD-10-CM | POA: Diagnosis not present

## 2021-08-27 DIAGNOSIS — E559 Vitamin D deficiency, unspecified: Secondary | ICD-10-CM | POA: Diagnosis not present

## 2021-08-27 DIAGNOSIS — Z794 Long term (current) use of insulin: Secondary | ICD-10-CM | POA: Diagnosis not present

## 2021-08-27 DIAGNOSIS — E1122 Type 2 diabetes mellitus with diabetic chronic kidney disease: Secondary | ICD-10-CM | POA: Diagnosis not present

## 2021-08-27 DIAGNOSIS — R809 Proteinuria, unspecified: Secondary | ICD-10-CM | POA: Diagnosis not present

## 2021-08-27 DIAGNOSIS — M8580 Other specified disorders of bone density and structure, unspecified site: Secondary | ICD-10-CM | POA: Diagnosis not present

## 2021-08-27 DIAGNOSIS — D692 Other nonthrombocytopenic purpura: Secondary | ICD-10-CM | POA: Diagnosis not present

## 2021-08-27 DIAGNOSIS — M170 Bilateral primary osteoarthritis of knee: Secondary | ICD-10-CM | POA: Diagnosis not present

## 2021-08-27 DIAGNOSIS — E669 Obesity, unspecified: Secondary | ICD-10-CM | POA: Diagnosis not present

## 2021-08-27 DIAGNOSIS — F321 Major depressive disorder, single episode, moderate: Secondary | ICD-10-CM | POA: Diagnosis not present

## 2021-09-11 ENCOUNTER — Telehealth: Payer: Self-pay | Admitting: Genetic Counselor

## 2021-09-11 NOTE — Telephone Encounter (Signed)
Left VM for patient.  She asked if she needed additional testing for kidney cancer based on her recent diagnosis of kidney diseae and family history of kidney cancer.  Her testing that was performed in 2021 is still comprehensive for the same kidney cancer genes that we are currently testing.  We do not need to update her testing at this time.

## 2021-09-18 ENCOUNTER — Ambulatory Visit: Payer: Medicare HMO

## 2021-09-24 ENCOUNTER — Ambulatory Visit: Payer: Medicare HMO

## 2021-09-24 ENCOUNTER — Other Ambulatory Visit: Payer: Self-pay

## 2021-09-24 DIAGNOSIS — M2012 Hallux valgus (acquired), left foot: Secondary | ICD-10-CM

## 2021-09-24 DIAGNOSIS — E119 Type 2 diabetes mellitus without complications: Secondary | ICD-10-CM

## 2021-09-24 DIAGNOSIS — M2011 Hallux valgus (acquired), right foot: Secondary | ICD-10-CM

## 2021-09-24 NOTE — Progress Notes (Signed)
SITUATION Reason for Consult: Evaluation for Prefabricated Diabetic Shoes and Bilateral Custom Diabetic Inserts. Patient / Caregiver Report: Patient would like well fitting shoes  OBJECTIVE DATA: Patient History / Diagnosis:    ICD-10-CM   1. Controlled type 2 diabetes mellitus without complication, without long-term current use of insulin ([facility])  E11.9     2. Acquired hallux valgus of left foot  M20.12     3. Acquired hallux valgus of right foot  M20.11       Current or Previous Devices:   None and no history  In-Person Foot Examination: Ulcers & Callousing:   None  Toe / Foot Deformities:   - Pes Planus - Hallux valgus   Shoe Size: 95M  ORTHOTIC RECOMMENDATION Recommended Devices: - 1x pair prefabricated PDAC approved diabetic shoes: [id number] 95M - 3x pair custom-to-patient vacuum formed diabetic insoles.   GOALS OF SHOES AND INSOLES - Reduce shear and pressure - Reduce / Prevent callus formation - Reduce / Prevent ulceration - Protect the fragile healing compromised diabetic foot.  Patient would benefit from diabetic shoes and inserts as patient has diabetes mellitus and the patient has one or more of the following conditions: - Peripheral neuropathy with evidence of callus formation - Foot deformity - Poor circulation  ACTIONS PERFORMED Patient was casted for insoles via crush box and measured for shoes via brannock device. Procedure was explained and patient tolerated procedure well. All questions were answered and concerns addressed.  PLAN Patient is to ensure treating physician receives and completes diabetic paperwork. Casts and shoe order are to be held until paperwork is received. Once received patient is to be scheduled for fitting in four weeks.

## 2021-09-25 ENCOUNTER — Ambulatory Visit: Payer: Medicare HMO | Admitting: Surgical

## 2021-10-02 ENCOUNTER — Telehealth: Payer: Self-pay | Admitting: Podiatry

## 2021-10-02 NOTE — Telephone Encounter (Signed)
Left message for Dr Deneen Harts assistant that we did get the diabetic shoe fax but page 3 did not come thru clear and I asked if she could refax it to me @ [phone]. And to call if any questions

## 2021-10-07 ENCOUNTER — Ambulatory Visit: Payer: Medicare HMO | Admitting: Orthopedic Surgery

## 2021-10-13 ENCOUNTER — Telehealth: Payer: Self-pay | Admitting: Podiatry

## 2021-10-13 NOTE — Telephone Encounter (Signed)
Pt left message checking status of diabetic shoes.  I returned call and told pt we have everything but the inserts and they are being made now and I will call her when they come in to get an appt for her to pick up. She asked about what she would have to pay and I told her that we can not file the insurance until she picks them up and if any remaining balance after insurance they will bill her for that.

## 2021-10-21 ENCOUNTER — Ambulatory Visit: Payer: Medicare HMO | Admitting: Orthopedic Surgery

## 2021-11-05 ENCOUNTER — Ambulatory Visit: Payer: Medicare HMO

## 2021-11-06 ENCOUNTER — Ambulatory Visit: Payer: Self-pay

## 2021-11-06 ENCOUNTER — Ambulatory Visit (INDEPENDENT_AMBULATORY_CARE_PROVIDER_SITE_OTHER): Payer: Medicare HMO

## 2021-11-06 ENCOUNTER — Other Ambulatory Visit: Payer: Self-pay

## 2021-11-06 ENCOUNTER — Ambulatory Visit (INDEPENDENT_AMBULATORY_CARE_PROVIDER_SITE_OTHER): Payer: Medicare HMO | Admitting: Podiatry

## 2021-11-06 ENCOUNTER — Ambulatory Visit (INDEPENDENT_AMBULATORY_CARE_PROVIDER_SITE_OTHER): Payer: Medicare HMO | Admitting: Orthopedic Surgery

## 2021-11-06 VITALS — Ht 65.0 in | Wt 182.0 lb

## 2021-11-06 DIAGNOSIS — M25562 Pain in left knee: Secondary | ICD-10-CM

## 2021-11-06 DIAGNOSIS — M25561 Pain in right knee: Secondary | ICD-10-CM

## 2021-11-06 DIAGNOSIS — M1712 Unilateral primary osteoarthritis, left knee: Secondary | ICD-10-CM

## 2021-11-06 DIAGNOSIS — M1711 Unilateral primary osteoarthritis, right knee: Secondary | ICD-10-CM | POA: Diagnosis not present

## 2021-11-06 DIAGNOSIS — M170 Bilateral primary osteoarthritis of knee: Secondary | ICD-10-CM

## 2021-11-06 DIAGNOSIS — Z91199 Patient's noncompliance with other medical treatment and regimen due to unspecified reason: Secondary | ICD-10-CM

## 2021-11-07 ENCOUNTER — Encounter: Payer: Self-pay | Admitting: Orthopedic Surgery

## 2021-11-07 NOTE — Progress Notes (Signed)
Office Visit Note   Patient: Mckenzie Rogers           Date of Birth: 1941-02-19           MRN: [id number] Visit Date: 11/06/2021 Requested by: Gaspar Garbe, MD [address] [place],  [place] [address] PCP: Wylene Simmer, [name], MD  Subjective: Chief Complaint  Patient presents with   Left Knee - Pain   Right Knee - Pain    HPI: Mckenzie Rogers is a 81 y.o. female who presents to the office complaining of bilateral knee pain, left greater than right.  She has long history of bilateral knee osteoarthritis that is quite severe.  She states that she would like to proceed with surgery later this year as far as getting the knees replaced.  She states that pain is waking her up at night and makes her activities of daily living very difficult.  She states that she used to work on the floor at a hospital and understands the rehab that is involved after knee replacement surgery.  She lives at home with her 2 sons: 1 son is partially blind and 1 son is on dialysis but both are very helpful as far as helping her after surgery.  She has no history of surgery on either knee.  She does have history of diabetes with last A1c 6.7.  She also has history of CKD but she states that it is not progressed severely and she does not require dialysis or regular follow-up with the nephrologist.  She does not take any blood thinners.  No history of smoking.  She has 1 step to get into her house and is a 1 level home.  She would like to have surgery in July or August and would like to have injections today.  Injections usually last for about 2 weeks.              ROS: All systems reviewed are negative as they relate to the chief complaint within the history of present illness.  Patient denies fevers or chills.  Assessment & Plan: Visit Diagnoses:  1. Pain in both knees, unspecified chronicity     Plan: Patient is a 81 year old female who presents for evaluation of bilateral knee pain.  She has history of  severe bilateral knee osteoarthritis.  Left knee is bothering her more.  She would like to proceed with knee replacement surgery around August.  Discussed knee replacement surgery and what it entails as far as the surgical process, hospitalization, recovery timeframe.  She understands that there will be increased pain in her knee after surgery with potentially severe knee pain and she will have extensive rehabilitation afterward.  Discussed the risks and benefits of the procedures including the risk of nerve/vessel damage, knee stiffness, knee instability, need for revision surgery, prosthetic joint infection, medical complication from surgery such as DVT/PE/stroke/heart attack/death.  After discussion, patient would still like to proceed with surgery.  She will be scheduled for surgery in August and will require medical risk stratification.  In addition, bilateral knee cortisone injections were administered today for symptomatic relief.  Patient tolerated the procedure well.  Did recommend she perform straight leg raises and some knee strengthening exercises in order to make her rehab easier after surgery.  Follow-Up Instructions: No follow-ups on file.   Orders:  Orders Placed This Encounter  Procedures   XR Knee 1-2 Views Left   XR Knee 1-2 Views Right   No orders of the defined  types were placed in this encounter.     Procedures: Large Joint Inj: bilateral knee on 11/06/2021 4:55 PM Indications: diagnostic evaluation, joint swelling and pain Details: 18 G 1.5 in needle, superolateral approach  Arthrogram: No  Medications (Right): 5 mL lidocaine 1 %; 4 mL bupivacaine 0.25 %; 40 mg methylPREDNISolone acetate 40 MG/ML Medications (Left): 5 mL lidocaine 1 %; 4 mL bupivacaine 0.25 %; 40 mg methylPREDNISolone acetate 40 MG/ML Outcome: tolerated well, no immediate complications Procedure, treatment alternatives, risks and benefits explained, specific risks discussed. Consent was given by the  patient. Immediately prior to procedure a time out was called to verify the correct patient, procedure, equipment, support staff and site/side marked as required. Patient was prepped and draped in the usual sterile fashion.      Clinical Data: No additional findings.  Objective: Vital Signs: Ht 5\' 5"  (1.651 m)    Wt 182 lb (82.6 kg)    BMI 30.29 kg/m   Physical Exam:  Constitutional: Patient appears well-developed HEENT:  Head: Normocephalic Eyes:EOM are normal Neck: Normal range of motion Cardiovascular: Normal rate Pulmonary/chest: Effort normal Neurologic: Patient is alert Skin: Skin is warm Psychiatric: Patient has normal mood and affect  Ortho Exam: Ortho exam demonstrates both knees with about 15 degree flexion contracture.  Flexion to about 90 to 95 degrees in either knee.  Tenderness over the medial and lateral joint lines of both knees moderately.  She has no significant pain with hip range of motion on either side.  She is able to perform straight leg raise though this requires a great deal of effort.  No calf tenderness.  Negative Homans' sign.  Specialty Comments:  No specialty comments available.  Imaging: No results found.   PMFS History: Patient Active Problem List   Diagnosis Date Noted   Controlled type 2 diabetes mellitus without complication, without long-term current use of insulin ([facility]) 07/31/2021   Genetic testing 03/14/2020   Family history of pancreatic cancer    Family history of kidney cancer    Family history of lung cancer    Past Medical History:  Diagnosis Date   Diabetes ([facility])    Edema    Family history of kidney cancer    Family history of lung cancer    Family history of pancreatic cancer    Osteoarthritis of both knees     Family History  Problem Relation Age of Onset   Diabetes Maternal Aunt    CVA Daughter    Kidney failure Son    Pancreatic cancer Father 80   Kidney cancer Half-Sister 64       non-smoker    Lung cancer Half-Sister 52       hx of smoking   Cancer Half-Sister 38       unknown primary    Past Surgical History:  Procedure Laterality Date   FOOT SURGERY Left    TUBAL LIGATION     Social History   Occupational History   Not on file  Tobacco Use   Smoking status: Never   Smokeless tobacco: Never  Substance and Sexual Activity   Alcohol use: Not on file   Drug use: Not on file   Sexual activity: Not on file

## 2021-11-08 ENCOUNTER — Encounter: Payer: Self-pay | Admitting: Orthopedic Surgery

## 2021-11-08 MED ORDER — LIDOCAINE HCL 1 % IJ SOLN
5.0000 mL | INTRAMUSCULAR | Status: AC | PRN
  Administered 2021-11-06: 5 mL

## 2021-11-08 MED ORDER — METHYLPREDNISOLONE ACETATE 40 MG/ML IJ SUSP
40.0000 mg | INTRAMUSCULAR | Status: AC | PRN
  Administered 2021-11-06: 40 mg via INTRA_ARTICULAR

## 2021-11-08 MED ORDER — BUPIVACAINE HCL 0.25 % IJ SOLN
4.0000 mL | INTRAMUSCULAR | Status: AC | PRN
  Administered 2021-11-06: 4 mL via INTRA_ARTICULAR

## 2021-11-10 NOTE — Progress Notes (Signed)
   Complete physical exam  Patient: Mckenzie Rogers   DOB: 07/03/1997   81 y.o. Female  MRN: [id number]  Subjective:    No chief complaint on file.   Mckenzie Rogers is a 81 y.o. female who presents today for a complete physical exam. She reports consuming a {diet types:17450} diet. {types:19826} She generally feels {DESC; WELL/FAIRLY WELL/POORLY:18703}. She reports sleeping {DESC; WELL/FAIRLY WELL/POORLY:18703}. She {does/does not:200015} have additional problems to discuss today.    Most recent fall risk assessment:    03/10/2022   10:42 AM  Fall Risk   Falls in the past year? 0  Number falls in past yr: 0  Injury with Fall? 0  Risk for fall due to : No Fall Risks  Follow up Falls evaluation completed     Most recent depression screenings:    03/10/2022   10:42 AM 01/29/2021   10:46 AM  PHQ 2/9 Scores  PHQ - 2 Score 0 0  PHQ- 9 Score 5     {VISON DENTAL STD PSA (Optional):27386}  {History (Optional):23778}  Patient Care Team: Jessup, Joy, NP as PCP - General (Nurse Practitioner)   Outpatient Medications Prior to Visit  Medication Sig   fluticasone (FLONASE) 50 MCG/ACT nasal spray Place 2 sprays into both nostrils in the morning and at bedtime. After 7 days, reduce to once daily.   norgestimate-ethinyl estradiol (SPRINTEC 28) 0.25-35 MG-MCG tablet Take 1 tablet by mouth daily.   Nystatin POWD Apply liberally to affected area 2 times per day   spironolactone (ALDACTONE) 100 MG tablet Take 1 tablet (100 mg total) by mouth daily.   No facility-administered medications prior to visit.    ROS        Objective:     There were no vitals taken for this visit. {Vitals History (Optional):23777}  Physical Exam   No results found for any visits on 04/15/22. {Show previous labs (optional):23779}    Assessment & Plan:    Routine Health Maintenance and Physical Exam  Immunization History  Administered Date(s) Administered   DTaP 09/16/1999, 11/12/1999,  01/21/2000, 10/06/2000, 04/21/2004   Hepatitis A 02/16/2008, 02/21/2009   Hepatitis B 07/04/1999, 08/11/1999, 01/21/2000   HiB (PRP-OMP) 09/16/1999, 11/12/1999, 01/21/2000, 10/06/2000   IPV 09/16/1999, 11/12/1999, 07/11/2000, 04/21/2004   Influenza,inj,Quad PF,6+ Mos 05/24/2014   Influenza-Unspecified 08/23/2012   MMR 07/11/2001, 04/21/2004   Meningococcal Polysaccharide 02/21/2012   Pneumococcal Conjugate-13 10/06/2000   Pneumococcal-Unspecified 01/21/2000, 04/05/2000   Tdap 02/21/2012   Varicella 07/11/2000, 02/16/2008    Health Maintenance  Topic Date Due   HIV Screening  Never done   Hepatitis C Screening  Never done   INFLUENZA VACCINE  04/13/2022   PAP-Cervical Cytology Screening  04/15/2022 (Originally 07/02/2020)   PAP SMEAR-Modifier  04/15/2022 (Originally 07/02/2020)   TETANUS/TDAP  04/15/2022 (Originally 02/20/2022)   HPV VACCINES  Discontinued   COVID-19 Vaccine  Discontinued    Discussed health benefits of physical activity, and encouraged her to engage in regular exercise appropriate for her age and condition.  Problem List Items Addressed This Visit   None Visit Diagnoses     Annual physical exam    -  Primary   Cervical cancer screening       Need for Tdap vaccination          No follow-ups on file.     Joy Jessup, NP   

## 2021-11-12 ENCOUNTER — Ambulatory Visit (INDEPENDENT_AMBULATORY_CARE_PROVIDER_SITE_OTHER): Payer: Medicare HMO

## 2021-11-12 ENCOUNTER — Other Ambulatory Visit: Payer: Self-pay

## 2021-11-12 DIAGNOSIS — M2141 Flat foot [pes planus] (acquired), right foot: Secondary | ICD-10-CM | POA: Diagnosis not present

## 2021-11-12 DIAGNOSIS — E119 Type 2 diabetes mellitus without complications: Secondary | ICD-10-CM

## 2021-11-12 DIAGNOSIS — M2142 Flat foot [pes planus] (acquired), left foot: Secondary | ICD-10-CM | POA: Diagnosis not present

## 2021-11-12 DIAGNOSIS — M2012 Hallux valgus (acquired), left foot: Secondary | ICD-10-CM | POA: Diagnosis not present

## 2021-11-12 DIAGNOSIS — M2011 Hallux valgus (acquired), right foot: Secondary | ICD-10-CM

## 2021-11-12 NOTE — Progress Notes (Signed)
SITUATION ?Reason for Visit: Fitting of Diabetic Shoes & Insoles ?Patient / Caregiver Report:  Patient is satisfied with fit and function of shoes and insoles. ? ?OBJECTIVE DATA: ?Patient History / Diagnosis:   ?  ICD-10-CM   ?1. Controlled type 2 diabetes mellitus without complication, without long-term current use of insulin ([facility])  E11.9   ?  ?2. Acquired hallux valgus of left foot  M20.12   ?  ?3. Acquired hallux valgus of right foot  M20.11   ?  ?4. Pes planus of both feet  M21.41   ? M21.42   ?  ? ? ?Change in Status:   None ? ?ACTIONS PERFORMED: ?In-Person Delivery, patient was fit with: ?- 1x pair A5500 PDAC approved prefabricated Diabetic Shoes: A3200W 10W ?- 3x pair A5513 PDAC approved vacuum formed custom diabetic insoles; [employer]: [id number] ? ?Shoes and insoles were verified for structural integrity and safety. Patient wore shoes and insoles in office. Skin was inspected and free of areas of concern after wearing shoes and inserts. Shoes and inserts fit properly. Patient / Caregiver provided with ferbal instruction and demonstration regarding donning, doffing, wear, care, proper fit, function, purpose, cleaning, and use of shoes and insoles ' and in all related precautions and risks and benefits regarding shoes and insoles. Patient / Caregiver was instructed to wear properly fitting socks with shoes at all times. Patient was also provided with verbal instruction regarding how to report any failures or malfunctions of shoes or inserts, and necessary follow up care. Patient / Caregiver was also instructed to contact physician regarding change in status that may affect function of shoes and inserts.  ? ?Patient / Caregiver verbalized undersatnding of instruction provided. Patient / Caregiver demonstrated independence with proper donning and doffing of shoes and inserts. ? ?PLAN ?Patient to follow up as needed. Plan of care was discussed with and agreed upon by patient and/or caregiver. All questions were  answered and concerns addressed. ? ?

## 2021-11-27 ENCOUNTER — Ambulatory Visit: Payer: Medicare HMO | Admitting: Podiatry

## 2021-12-01 ENCOUNTER — Ambulatory Visit (INDEPENDENT_AMBULATORY_CARE_PROVIDER_SITE_OTHER): Payer: Self-pay | Admitting: Podiatry

## 2021-12-01 DIAGNOSIS — Z91199 Patient's noncompliance with other medical treatment and regimen due to unspecified reason: Secondary | ICD-10-CM

## 2021-12-01 NOTE — Progress Notes (Signed)
   Complete physical exam  Patient: Mckenzie Rogers   DOB: 07/03/1997   81 y.o. Female  MRN: [id number]  Subjective:    No chief complaint on file.   Mckenzie Rogers is a 81 y.o. female who presents today for a complete physical exam. She reports consuming a {diet types:17450} diet. {types:19826} She generally feels {DESC; WELL/FAIRLY WELL/POORLY:18703}. She reports sleeping {DESC; WELL/FAIRLY WELL/POORLY:18703}. She {does/does not:200015} have additional problems to discuss today.    Most recent fall risk assessment:    03/10/2022   10:42 AM  Fall Risk   Falls in the past year? 0  Number falls in past yr: 0  Injury with Fall? 0  Risk for fall due to : No Fall Risks  Follow up Falls evaluation completed     Most recent depression screenings:    03/10/2022   10:42 AM 01/29/2021   10:46 AM  PHQ 2/9 Scores  PHQ - 2 Score 0 0  PHQ- 9 Score 5     {VISON DENTAL STD PSA (Optional):27386}  {History (Optional):23778}  Patient Care Team: Jessup, Joy, NP as PCP - General (Nurse Practitioner)   Outpatient Medications Prior to Visit  Medication Sig   fluticasone (FLONASE) 50 MCG/ACT nasal spray Place 2 sprays into both nostrils in the morning and at bedtime. After 7 days, reduce to once daily.   norgestimate-ethinyl estradiol (SPRINTEC 28) 0.25-35 MG-MCG tablet Take 1 tablet by mouth daily.   Nystatin POWD Apply liberally to affected area 2 times per day   spironolactone (ALDACTONE) 100 MG tablet Take 1 tablet (100 mg total) by mouth daily.   No facility-administered medications prior to visit.    ROS        Objective:     There were no vitals taken for this visit. {Vitals History (Optional):23777}  Physical Exam   No results found for any visits on 04/15/22. {Show previous labs (optional):23779}    Assessment & Plan:    Routine Health Maintenance and Physical Exam  Immunization History  Administered Date(s) Administered   DTaP 09/16/1999, 11/12/1999,  01/21/2000, 10/06/2000, 04/21/2004   Hepatitis A 02/16/2008, 02/21/2009   Hepatitis B 07/04/1999, 08/11/1999, 01/21/2000   HiB (PRP-OMP) 09/16/1999, 11/12/1999, 01/21/2000, 10/06/2000   IPV 09/16/1999, 11/12/1999, 07/11/2000, 04/21/2004   Influenza,inj,Quad PF,6+ Mos 05/24/2014   Influenza-Unspecified 08/23/2012   MMR 07/11/2001, 04/21/2004   Meningococcal Polysaccharide 02/21/2012   Pneumococcal Conjugate-13 10/06/2000   Pneumococcal-Unspecified 01/21/2000, 04/05/2000   Tdap 02/21/2012   Varicella 07/11/2000, 02/16/2008    Health Maintenance  Topic Date Due   HIV Screening  Never done   Hepatitis C Screening  Never done   INFLUENZA VACCINE  04/13/2022   PAP-Cervical Cytology Screening  04/15/2022 (Originally 07/02/2020)   PAP SMEAR-Modifier  04/15/2022 (Originally 07/02/2020)   TETANUS/TDAP  04/15/2022 (Originally 02/20/2022)   HPV VACCINES  Discontinued   COVID-19 Vaccine  Discontinued    Discussed health benefits of physical activity, and encouraged her to engage in regular exercise appropriate for her age and condition.  Problem List Items Addressed This Visit   None Visit Diagnoses     Annual physical exam    -  Primary   Cervical cancer screening       Need for Tdap vaccination          No follow-ups on file.     Joy Jessup, NP   

## 2022-01-22 DIAGNOSIS — R399 Unspecified symptoms and signs involving the genitourinary system: Secondary | ICD-10-CM | POA: Diagnosis not present

## 2022-01-26 ENCOUNTER — Telehealth: Payer: Self-pay | Admitting: Orthopedic Surgery

## 2022-01-26 NOTE — Telephone Encounter (Signed)
Left another message on patient's voicemail asking her to call me to discuss scheduling left total knee arthroplasty with Dr. Marlou Sa. Name and direct number where left on voicemail so patient could reach me directly.   ?

## 2022-02-18 ENCOUNTER — Telehealth: Payer: Self-pay | Admitting: Orthopedic Surgery

## 2022-02-18 NOTE — Telephone Encounter (Signed)
Surgery sheet completed 11-09-21 indicating patient wanted to schedule left total knee replacement in July.  I called patient to offer dates while we work toward obtaining medical clearance.  Patient states she wants to put off surgery and have in September of this year.  She would like to come in for another injection but appointment has not been set up.  She mentions not being able to have another injection until very end of June.  Please advise when she can have the next injection and as to when patient can have surgery she does decide on another another cortisone injection.

## 2022-02-18 NOTE — Telephone Encounter (Signed)
She is okay for injection but she will have to put off surgery for at least 3 months following that injection

## 2022-02-19 NOTE — Telephone Encounter (Signed)
IC advised she will opt for surgery at the time until later this year and get injection.  Offered for her to see Franky Macho next week but refused stating she wanted to wait for [place] next available.

## 2022-02-26 ENCOUNTER — Ambulatory Visit: Payer: Medicare HMO | Admitting: Podiatry

## 2022-02-26 DIAGNOSIS — E559 Vitamin D deficiency, unspecified: Secondary | ICD-10-CM | POA: Diagnosis not present

## 2022-02-26 DIAGNOSIS — E1122 Type 2 diabetes mellitus with diabetic chronic kidney disease: Secondary | ICD-10-CM | POA: Diagnosis not present

## 2022-02-26 DIAGNOSIS — R7989 Other specified abnormal findings of blood chemistry: Secondary | ICD-10-CM | POA: Diagnosis not present

## 2022-03-05 DIAGNOSIS — R809 Proteinuria, unspecified: Secondary | ICD-10-CM | POA: Diagnosis not present

## 2022-03-05 DIAGNOSIS — E1122 Type 2 diabetes mellitus with diabetic chronic kidney disease: Secondary | ICD-10-CM | POA: Diagnosis not present

## 2022-03-05 DIAGNOSIS — M8580 Other specified disorders of bone density and structure, unspecified site: Secondary | ICD-10-CM | POA: Diagnosis not present

## 2022-03-05 DIAGNOSIS — Z0000 Encounter for general adult medical examination without abnormal findings: Secondary | ICD-10-CM | POA: Diagnosis not present

## 2022-03-05 DIAGNOSIS — I129 Hypertensive chronic kidney disease with stage 1 through stage 4 chronic kidney disease, or unspecified chronic kidney disease: Secondary | ICD-10-CM | POA: Diagnosis not present

## 2022-03-05 DIAGNOSIS — N182 Chronic kidney disease, stage 2 (mild): Secondary | ICD-10-CM | POA: Diagnosis not present

## 2022-03-05 DIAGNOSIS — Z794 Long term (current) use of insulin: Secondary | ICD-10-CM | POA: Diagnosis not present

## 2022-03-05 DIAGNOSIS — Z1331 Encounter for screening for depression: Secondary | ICD-10-CM | POA: Diagnosis not present

## 2022-03-05 DIAGNOSIS — M170 Bilateral primary osteoarthritis of knee: Secondary | ICD-10-CM | POA: Diagnosis not present

## 2022-03-05 DIAGNOSIS — G4733 Obstructive sleep apnea (adult) (pediatric): Secondary | ICD-10-CM | POA: Diagnosis not present

## 2022-03-05 DIAGNOSIS — Z1339 Encounter for screening examination for other mental health and behavioral disorders: Secondary | ICD-10-CM | POA: Diagnosis not present

## 2022-03-19 ENCOUNTER — Ambulatory Visit: Payer: Medicare HMO | Admitting: Orthopedic Surgery

## 2022-03-19 ENCOUNTER — Ambulatory Visit: Payer: Medicare HMO | Admitting: Podiatry

## 2022-03-26 ENCOUNTER — Ambulatory Visit: Payer: Medicare HMO | Admitting: Orthopedic Surgery

## 2022-03-26 DIAGNOSIS — M1711 Unilateral primary osteoarthritis, right knee: Secondary | ICD-10-CM | POA: Diagnosis not present

## 2022-03-26 DIAGNOSIS — M1712 Unilateral primary osteoarthritis, left knee: Secondary | ICD-10-CM

## 2022-03-27 ENCOUNTER — Encounter: Payer: Self-pay | Admitting: Orthopedic Surgery

## 2022-03-27 MED ORDER — LIDOCAINE HCL 1 % IJ SOLN
5.0000 mL | INTRAMUSCULAR | Status: AC | PRN
  Administered 2022-03-26: 5 mL

## 2022-03-27 MED ORDER — METHYLPREDNISOLONE ACETATE 40 MG/ML IJ SUSP
40.0000 mg | INTRAMUSCULAR | Status: AC | PRN
  Administered 2022-03-26: 40 mg via INTRA_ARTICULAR

## 2022-03-27 MED ORDER — BUPIVACAINE HCL 0.25 % IJ SOLN
4.0000 mL | INTRAMUSCULAR | Status: AC | PRN
  Administered 2022-03-26: 4 mL via INTRA_ARTICULAR

## 2022-03-27 NOTE — Progress Notes (Signed)
Office Visit Note   Patient: Mckenzie Rogers           Date of Birth: [birth year]           MRN: [id number] Visit Date: 03/26/2022 Requested by: Gaspar Garbe, MD [address] [place],  [place] [address] PCP: Wylene Simmer [name], MD  Subjective: Chief Complaint  Patient presents with   Right Knee - Pain    HPI: Patient presents for evaluation of left knee pain.  She has known history of severe bilateral knee arthritis.  She had left knee cortisone injection 223.  She would like to get the knee replaced in the fall or winter.  She does report a history of DVT with pregnancy but did not have a pulmonary embolism.  Denies any groin pain or back pain.  Pain is severe.              ROS: All systems reviewed are negative as they relate to the chief complaint within the history of present illness.  Patient denies  fevers or chills.   Assessment & Plan: Visit Diagnoses:  1. Unilateral primary osteoarthritis, left knee   2. Unilateral primary osteoarthritis, right knee     Plan: Impression is end-stage left knee arthritis.  Plan is left knee aspiration and injection today.  Handicap sticker provided.  The risk and benefits of knee replacement are discussed with the patient include not limited to infection or vessel damage incomplete pain relief as well as a very rigorous rehabilitative process required to get a good result.  Patient understands risk benefits.  We would likely put her on Xarelto postop with ultrasound at 2-week check.  Patient understands risk and benefits and wishes to proceed.  All questions answered.  Follow-Up Instructions: No follow-ups on file.   Orders:  No orders of the defined types were placed in this encounter.  No orders of the defined types were placed in this encounter.     Procedures: Large Joint Inj: R knee on 03/26/2022 11:41 AM Indications: diagnostic evaluation, joint swelling and pain Details: 18 G 1.5 in needle, superolateral  approach  Arthrogram: No  Medications: 5 mL lidocaine 1 %; 40 mg methylPREDNISolone acetate 40 MG/ML; 4 mL bupivacaine 0.25 % Outcome: tolerated well, no immediate complications Procedure, treatment alternatives, risks and benefits explained, specific risks discussed. Consent was given by the patient. Immediately prior to procedure a time out was called to verify the correct patient, procedure, equipment, support staff and site/side marked as required. Patient was prepped and draped in the usual sterile fashion.       Clinical Data: No additional findings.  Objective: Vital Signs: There were no vitals taken for this visit.  Physical Exam:   Constitutional: Patient appears well-developed HEENT:  Head: Normocephalic Eyes:EOM are normal Neck: Normal range of motion Cardiovascular: Normal rate Pulmonary/chest: Effort normal Neurologic: Patient is alert Skin: Skin is warm Psychiatric: Patient has normal mood and affect   Ortho Exam: Ortho exam demonstrates range of motion that left knee from 10-90.  Extensor mechanism intact.  Pedal pulses palpable.  Ankle dorsiflexion intact.  No groin pain with internal/external Tatian of the leg.  Skin is intact in the knee region.  Collaterals are stable.  Specialty Comments:  No specialty comments available.  Imaging: No results found.   PMFS History: Patient Active Problem List   Diagnosis Date Noted   Controlled type 2 diabetes mellitus without complication, without long-term current use of insulin (HCC) 07/31/2021   Genetic  testing 03/14/2020   Family history of pancreatic cancer    Family history of kidney cancer    Family history of lung cancer    Past Medical History:  Diagnosis Date   Diabetes (HCC)    Edema    Family history of kidney cancer    Family history of lung cancer    Family history of pancreatic cancer    Osteoarthritis of both knees     Family History  Problem Relation Age of Onset   Diabetes Maternal  Aunt    CVA Daughter    Kidney failure Son    Pancreatic cancer Father 30   Kidney cancer Half-Sister 69       non-smoker   Lung cancer Half-Sister 63       hx of smoking   Cancer Half-Sister 89       unknown primary    Past Surgical History:  Procedure Laterality Date   FOOT SURGERY Left    TUBAL LIGATION     Social History   Occupational History   Not on file  Tobacco Use   Smoking status: Never   Smokeless tobacco: Never  Substance and Sexual Activity   Alcohol use: Not on file   Drug use: Not on file   Sexual activity: Not on file

## 2022-04-01 ENCOUNTER — Ambulatory Visit: Payer: Medicare HMO | Admitting: Podiatry

## 2022-05-20 ENCOUNTER — Ambulatory Visit: Payer: Medicare HMO | Admitting: Podiatry

## 2022-05-20 DIAGNOSIS — M79674 Pain in right toe(s): Secondary | ICD-10-CM | POA: Diagnosis not present

## 2022-05-20 DIAGNOSIS — M79675 Pain in left toe(s): Secondary | ICD-10-CM

## 2022-05-20 DIAGNOSIS — E119 Type 2 diabetes mellitus without complications: Secondary | ICD-10-CM | POA: Diagnosis not present

## 2022-05-20 DIAGNOSIS — B351 Tinea unguium: Secondary | ICD-10-CM

## 2022-05-20 DIAGNOSIS — L84 Corns and callosities: Secondary | ICD-10-CM | POA: Diagnosis not present

## 2022-05-24 NOTE — Progress Notes (Signed)
  Subjective:  Patient ID: Mckenzie Rogers, female    DOB: 1941/03/31,  MRN: [id number]  Chief Complaint  Patient presents with   Nail Problem    Thick painful toenails, overdue 3 month follow up    81 y.o. female presents with the above complaint. History confirmed with patient.  The right third toe is very painful because of the amount of callus that is there at the tip of the toenail  Objective:  Physical Exam: warm, good capillary refill, no trophic changes or ulcerative lesions, normal DP and PT pulses, normal monofilament exam, and normal sensory exam. Left Foot: dystrophic yellowed discolored nail plates with subungual debris Right Foot: dystrophic yellowed discolored nail plates with subungual debris and third toe tip severe hyperkeratotic painful lesion  Assessment:   1. Pain due to onychomycosis of toenails of both feet   2. Controlled type 2 diabetes mellitus without complication, without long-term current use of insulin (HCC)   3. Callus of foot      Plan:  Patient was evaluated and treated and all questions answered.  Discussed the etiology and treatment options for the condition in detail with the patient. Educated patient on the topical and oral treatment options for mycotic nails. Recommended debridement of the nails today. Sharp and mechanical debridement performed of all painful and mycotic nails today. Nails debrided in length and thickness using a nail nipper to level of comfort. Discussed treatment options including appropriate shoe gear. Follow up as needed for painful nails.   All symptomatic hyperkeratoses were safely debrided with a sterile #15 blade to patient's level of comfort without incident. We discussed preventative and palliative care of these lesions including supportive and accommodative shoegear, padding, prefabricated and custom molded accommodative orthoses, use of a pumice stone and lotions/creams daily. Debridement of this callus required  local anesthetic which was done with 1.5 cc each of 2% lidocaine and 0.5% Marcaine plain.  She tolerated the injection and debridement well  Return in about 3 months (around 08/19/2022) for at risk diabetic foot care.

## 2022-06-29 ENCOUNTER — Other Ambulatory Visit: Payer: Self-pay

## 2022-08-06 NOTE — Progress Notes (Signed)
Surgical Instructions    Your procedure is scheduled on Thursday December 7th.  Report to [facility] Main Entrance "A" at 10:15 A.M., then check in with the Admitting office.  Call this number if you have problems the morning of surgery:  [phone]   If you have any questions prior to your surgery date call [phone]: Open Monday-Friday 8am-4pm If you experience any cold or flu symptoms such as cough, fever, chills, shortness of breath, etc. between now and your scheduled surgery, please notify us at the above number     Remember:  Do not eat after midnight the night before your surgery  You may drink clear liquids until 9:15am the morning of your surgery.   Clear liquids allowed are: Water, Non-Citrus Juices (without pulp), Carbonated Beverages, Clear Tea, Black Coffee ONLY (NO MILK, CREAM OR POWDERED CREAMER of any kind), and Gatorade   Enhanced Recovery after Surgery for Orthopedics Enhanced Recovery after Surgery is a protocol used to improve the stress on your body and your recovery after surgery.  Patient Instructions   The day of surgery (if you have diabetes):  Drink ONE small 10 oz bottle of water by ___9:15__ am the morning of surgery This bottle was given to you during your hospital  pre-op appointment visit.  Nothing else to drink after completing the  Small 10 oz bottle of water.         If you have questions, please contact your surgeon's office.     Take these medicines the morning of surgery with A SIP OF WATER: atorvastatin (LIPITOR) 80 MG tablet  MYRBETRIQ 25 MG TB24 tablet  TOVIAZ 4 MG TB24 tablet   IF NEEDED  acetaminophen (TYLENOL) 650 MG CR tablet  escitalopram (LEXAPRO) 20 MG tablet    As of today, STOP taking any Aspirin (unless otherwise instructed by your surgeon) Aleve, Naproxen, Ibuprofen, Motrin, Advil, Goody's, BC's, all herbal medications, fish oil, and all vitamins.  WHAT DO I DO ABOUT MY DIABETES MEDICATION?   Do not take oral  diabetes medicines (pills) the morning of surgery.  HOLD your Farxiga 72 hours (3 days) prior to surgery  THE NIGHT BEFORE SURGERY, take __50%_________ units of ____Glargine_______insulin normal dose.       THE MORNING OF SURGERY, take ___50%__________ units of ___Glargine_______insulin normal dose.  The day of surgery, do not take other diabetes injectables, including Byetta (exenatide), Bydureon (exenatide ER), Victoza (liraglutide), or Trulicity (dulaglutide).  If your CBG is greater than 220 mg/dL, you may take  of your sliding scale (correction) dose of insulin.   HOW TO MANAGE YOUR DIABETES BEFORE AND AFTER SURGERY  Why is it important to control my blood sugar before and after surgery? Improving blood sugar levels before and after surgery helps healing and can limit problems. A way of improving blood sugar control is eating a healthy diet by:  Eating less sugar and carbohydrates  Increasing activity/exercise  Talking with your doctor about reaching your blood sugar goals High blood sugars (greater than 180 mg/dL) can raise your risk of infections and slow your recovery, so you will need to focus on controlling your diabetes during the weeks before surgery. Make sure that the doctor who takes care of your diabetes knows about your planned surgery including the date and location.  How do I manage my blood sugar before surgery? Check your blood sugar at least 4 times a day, starting 2 days before surgery, to make sure that the level is not too high or  low.  Check your blood sugar the morning of your surgery when you wake up and every 2 hours until you get to the Short Stay unit.  If your blood sugar is less than 70 mg/dL, you will need to treat for low blood sugar: Do not take insulin. Treat a low blood sugar (less than 70 mg/dL) with  cup of clear juice (cranberry or apple), 4 glucose tablets, OR glucose gel. Recheck blood sugar in 15 minutes after treatment (to make sure it  is greater than 70 mg/dL). If your blood sugar is not greater than 70 mg/dL on recheck, call [phone] for further instructions. Report your blood sugar to the short stay nurse when you get to Short Stay.  If you are admitted to the hospital after surgery: Your blood sugar will be checked by the staff and you will probably be given insulin after surgery (instead of oral diabetes medicines) to make sure you have good blood sugar levels. The goal for blood sugar control after surgery is 80-180 mg/dL.     DAY OF SURGERY      Do not wear jewelry or makeup. Do not wear lotions, powders, perfumes or deodorant. Do not shave 48 hours prior to surgery.   Do not bring valuables to the hospital. Do not wear nail polish, gel polish, artificial nails, or any other type of covering on natural nails (fingers and toes) If you have artificial nails or gel coating that need to be removed by a nail salon, please have this removed prior to surgery. Artificial nails or gel coating may interfere with anesthesia's ability to adequately monitor your vital signs.  [facility] is not responsible for any belongings or valuables.    Do NOT Smoke (Tobacco/Vaping)  24 hours prior to your procedure  If you use a CPAP at night, you may bring your mask for your overnight stay.   Contacts, glasses, hearing aids, dentures or partials may not be worn into surgery, please bring cases for these belongings   For patients admitted to the hospital, discharge time will be determined by your treatment team.   Patients discharged the day of surgery will not be allowed to drive home, and someone needs to stay with them for 24 hours.   SURGICAL WAITING ROOM VISITATION Patients having surgery or a procedure may have no more than 2 support people in the waiting area - these visitors may rotate.   Children under the age of 26 must have an adult with them who is not the patient. If the patient needs to stay at the hospital during  part of their recovery, the visitor guidelines for inpatient rooms apply. Pre-op nurse will coordinate an appropriate time for 1 support person to accompany patient in pre-op.  This support person may not rotate.   Please refer to [email or web address] for the visitor guidelines for Inpatients (after your surgery is over and you are in a regular room).    Special instructions:    Oral Hygiene is also important to reduce your risk of infection.  Remember - BRUSH YOUR TEETH THE MORNING OF SURGERY WITH YOUR REGULAR TOOTHPASTE   [facility]- Preparing For Surgery  Before surgery, you can play an important role. Because skin is not sterile, your skin needs to be as free of germs as possible. You can reduce the number of germs on your skin by washing with CHG (chlorahexidine gluconate) Soap before surgery.  CHG is an antiseptic cleaner which kills germs and bonds with the  skin to continue killing germs even after washing.     Please do not use if you have an allergy to CHG or antibacterial soaps. If your skin becomes reddened/irritated stop using the CHG.  Do not shave (including legs and underarms) for at least 48 hours prior to first CHG shower. It is OK to shave your face.  Please follow these instructions carefully.     Shower the NIGHT BEFORE SURGERY and the MORNING OF SURGERY with CHG Soap.   If you chose to wash your hair, wash your hair first as usual with your normal shampoo. After you shampoo, rinse your hair and body thoroughly to remove the shampoo.  Then [employer] and genitals (private parts) with your normal soap and rinse thoroughly to remove soap.  After that Use CHG Soap as you would any other liquid soap. You can apply CHG directly to the skin and wash gently with a scrungie or a clean washcloth.   Apply the CHG Soap to your body ONLY FROM THE NECK DOWN.  Do not use on open wounds or open sores. Avoid contact with your eyes, ears,  mouth and genitals (private parts). Wash Face and genitals (private parts)  with your normal soap.   Wash thoroughly, paying special attention to the area where your surgery will be performed.  Thoroughly rinse your body with warm water from the neck down.  DO NOT shower/wash with your normal soap after using and rinsing off the CHG Soap.  Pat yourself dry with a CLEAN TOWEL.  Wear CLEAN PAJAMAS to bed the night before surgery  Place CLEAN SHEETS on your bed the night before your surgery  DO NOT SLEEP WITH PETS.   Day of Surgery:  Take a shower with CHG soap. Wear Clean/Comfortable clothing the morning of surgery Do not apply any deodorants/lotions.   Remember to brush your teeth WITH YOUR REGULAR TOOTHPASTE.    If you received a COVID test during your pre-op visit, it is requested that you wear a mask when out in public, stay away from anyone that may not be feeling well, and notify your surgeon if you develop symptoms. If you have been in contact with anyone that has tested positive in the last 10 days, please notify your surgeon.    Please read over the following fact sheets that you were given.

## 2022-08-09 ENCOUNTER — Other Ambulatory Visit: Payer: Self-pay

## 2022-08-09 ENCOUNTER — Ambulatory Visit: Payer: Medicare HMO

## 2022-08-09 ENCOUNTER — Encounter (HOSPITAL_COMMUNITY): Payer: Self-pay

## 2022-08-09 ENCOUNTER — Ambulatory Visit: Payer: Self-pay

## 2022-08-09 ENCOUNTER — Encounter (HOSPITAL_COMMUNITY)
Admission: RE | Admit: 2022-08-09 | Discharge: 2022-08-09 | Disposition: A | Discharge status: Home / Self Care | Payer: Medicare HMO | Source: Ambulatory Visit | Attending: Orthopedic Surgery | Admitting: Orthopedic Surgery

## 2022-08-09 VITALS — BP 114/64 | HR 70 | Temp 98.1°F | Resp 18 | Ht 65.0 in | Wt 185.3 lb

## 2022-08-09 DIAGNOSIS — E119 Type 2 diabetes mellitus without complications: Secondary | ICD-10-CM | POA: Insufficient documentation

## 2022-08-09 DIAGNOSIS — Z794 Long term (current) use of insulin: Secondary | ICD-10-CM | POA: Insufficient documentation

## 2022-08-09 DIAGNOSIS — M25561 Pain in right knee: Secondary | ICD-10-CM

## 2022-08-09 DIAGNOSIS — Z01818 Encounter for other preprocedural examination: Secondary | ICD-10-CM | POA: Diagnosis not present

## 2022-08-09 HISTORY — DX: Sleep apnea, unspecified: G47.30

## 2022-08-09 HISTORY — DX: Chronic kidney disease, unspecified: N18.9

## 2022-08-09 HISTORY — DX: Depression, unspecified: F32.A

## 2022-08-09 HISTORY — DX: Other complications of anesthesia, initial encounter: T88.59XA

## 2022-08-09 LAB — URINALYSIS, COMPLETE (UACMP) WITH MICROSCOPIC
Bilirubin Urine: NEGATIVE
Glucose, UA: >=500 mg/dL — AB
Ketones, ur: NEGATIVE mg/dL
Leukocytes,Ua: NEGATIVE
Nitrite: POSITIVE — AB
Protein, ur: NEGATIVE mg/dL
Specific Gravity, Urine: 1.02 (ref 1.005–1.030)
pH: 5.5 (ref 5.0–8.0)

## 2022-08-09 LAB — CBC
HCT: 42 % (ref 36.0–46.0)
Hemoglobin: 13.2 g/dL (ref 12.0–15.0)
MCH: 30 pg (ref 26.0–34.0)
MCHC: 31.4 g/dL (ref 30.0–36.0)
MCV: 95.5 fL (ref 80.0–100.0)
Platelets: 179 10*3/uL (ref 150–400)
RBC: 4.4 MIL/uL (ref 3.87–5.11)
RDW: 14.2 % (ref 11.5–15.5)
WBC: 5.5 10*3/uL (ref 4.0–10.5)
nRBC: 0 % (ref 0.0–0.2)

## 2022-08-09 LAB — SURGICAL PCR SCREEN
MRSA, PCR: NEGATIVE
Staphylococcus aureus: NEGATIVE

## 2022-08-09 LAB — BASIC METABOLIC PANEL
Anion gap: 7 (ref 5–15)
BUN: 18 mg/dL (ref 8–23)
CO2: 26 mmol/L (ref 22–32)
Calcium: 9.6 mg/dL (ref 8.9–10.3)
Chloride: 109 mmol/L (ref 98–111)
Creatinine, Ser: 1.4 mg/dL — ABNORMAL HIGH (ref 0.44–1.00)
GFR, Estimated: 38 mL/min — ABNORMAL LOW (ref >60)
Glucose, Bld: 172 mg/dL — ABNORMAL HIGH (ref 70–99)
Potassium: 3.7 mmol/L (ref 3.5–5.1)
Sodium: 142 mmol/L (ref 135–145)

## 2022-08-09 LAB — GLUCOSE, CAPILLARY: Glucose-Capillary: 171 mg/dL — ABNORMAL HIGH (ref 70–99)

## 2022-08-09 NOTE — Progress Notes (Addendum)
Surgical Instructions    Your procedure is scheduled on Thursday December 7th.  Report to [facility] Main Entrance "A" at 10:15 A.M., then check in with the Admitting office.  Call this number if you have problems the morning of surgery:  [phone]   If you have any questions prior to your surgery date call [phone]: Open Monday-Friday 8am-4pm If you experience any cold or flu symptoms such as cough, fever, chills, shortness of breath, etc. between now and your scheduled surgery, please notify us at the above number     Remember:  Do not eat after midnight the night before your surgery  You may drink clear liquids until 9:15am the morning of your surgery.   Clear liquids allowed are: Water, Non-Citrus Juices (without pulp), Carbonated Beverages, Clear Tea, Black Coffee ONLY (NO MILK, CREAM OR POWDERED CREAMER of any kind), and Gatorade   Enhanced Recovery after Surgery for Orthopedics Enhanced Recovery after Surgery is a protocol used to improve the stress on your body and your recovery after surgery. Patient Instructions  The night before surgery:  No food after midnight. ONLY clear liquids after midnight  The day of surgery (if you have diabetes): Drink ONE (1) 12 oz G2 given to you in your pre admission testing appointment by 9:14 the morning of surgery. Drink in one sitting. Do not sip.  This drink was given to you during your hospital  pre-op appointment visit.  Nothing else to drink after completing the  12 oz bottle of G2.         If you have questions, please contact your surgeon's office.      Take these medicines the morning of surgery with A SIP OF WATER: atorvastatin (LIPITOR) 80 MG tablet  MYRBETRIQ 25 MG TB24 tablet  TOVIAZ 4 MG TB24 tablet   IF NEEDED  acetaminophen (TYLENOL) 650 MG CR tablet  escitalopram (LEXAPRO) 20 MG tablet    As of today, STOP taking any Aspirin (unless otherwise instructed by your surgeon) Aleve, Naproxen, Ibuprofen, Motrin,  Advil, Goody's, BC's, all herbal medications, fish oil, and all vitamins.  WHAT DO I DO ABOUT MY DIABETES MEDICATION?   Do not take oral diabetes medicines (pills) the morning of surgery.  HOLD your Farxiga 72 hours (3 days) prior to surgery. Last dose 08/15/22    THE MORNING OF SURGERY,  take 14 units (half dose) of Glargine insulin (Basaglar) If needed  The day of surgery, do not take other diabetes injectables, including Byetta (exenatide), Bydureon (exenatide ER), Victoza (liraglutide), or Trulicity (dulaglutide).  If your CBG is greater than 220 mg/dL, you may take  of your sliding scale (correction) dose of insulin.   HOW TO MANAGE YOUR DIABETES BEFORE AND AFTER SURGERY  Why is it important to control my blood sugar before and after surgery? Improving blood sugar levels before and after surgery helps healing and can limit problems. A way of improving blood sugar control is eating a healthy diet by:  Eating less sugar and carbohydrates  Increasing activity/exercise  Talking with your doctor about reaching your blood sugar goals High blood sugars (greater than 180 mg/dL) can raise your risk of infections and slow your recovery, so you will need to focus on controlling your diabetes during the weeks before surgery. Make sure that the doctor who takes care of your diabetes knows about your planned surgery including the date and location.  How do I manage my blood sugar before surgery? Check your blood sugar at least 4  times a day, starting 2 days before surgery, to make sure that the level is not too high or low.  Check your blood sugar the morning of your surgery when you wake up and every 2 hours until you get to the Short Stay unit.  If your blood sugar is less than 70 mg/dL, you will need to treat for low blood sugar: Do not take insulin. Treat a low blood sugar (less than 70 mg/dL) with  cup of clear juice (cranberry or apple), 4 glucose tablets, OR glucose gel. Recheck  blood sugar in 15 minutes after treatment (to make sure it is greater than 70 mg/dL). If your blood sugar is not greater than 70 mg/dL on recheck, call [phone] for further instructions. Report your blood sugar to the short stay nurse when you get to Short Stay.  If you are admitted to the hospital after surgery: Your blood sugar will be checked by the staff and you will probably be given insulin after surgery (instead of oral diabetes medicines) to make sure you have good blood sugar levels. The goal for blood sugar control after surgery is 80-180 mg/dL.      [facility] is not responsible for any belongings or valuables.    Do NOT Smoke (Tobacco/Vaping)  24 hours prior to your procedure  If you use a CPAP at night, you may bring your mask for your overnight stay.   Contacts, glasses, hearing aids, dentures or partials may not be worn into surgery, please bring cases for these belongings   For patients admitted to the hospital, discharge time will be determined by your treatment team.   Patients discharged the day of surgery will not be allowed to drive home, and someone needs to stay with them for 24 hours.   SURGICAL WAITING ROOM VISITATION Patients having surgery or a procedure may have no more than 2 support people in the waiting area - these visitors may rotate.   Children under the age of 71 must have an adult with them who is not the patient. If the patient needs to stay at the hospital during part of their recovery, the visitor guidelines for inpatient rooms apply. Pre-op nurse will coordinate an appropriate time for 1 support person to accompany patient in pre-op.  This support person may not rotate.   Please refer to [email or web address] for the visitor guidelines for Inpatients (after your surgery is over and you are in a regular room).    Special instructions:    Oral Hygiene is also important to reduce your risk of  infection.  Remember - BRUSH YOUR TEETH THE MORNING OF SURGERY WITH YOUR REGULAR TOOTHPASTE   [place]- Preparing For Surgery  Before surgery, you can play an important role. Because skin is not sterile, your skin needs to be as free of germs as possible. You can reduce the number of germs on your skin by washing with CHG (chlorahexidine gluconate) Soap before surgery.  CHG is an antiseptic cleaner which kills germs and bonds with the skin to continue killing germs even after washing.     Please do not use if you have an allergy to CHG or antibacterial soaps. If your skin becomes reddened/irritated stop using the CHG.  Do not shave (including legs and underarms) for at least 48 hours prior to first CHG shower. It is OK to shave your face.  Please follow these instructions carefully.     Shower the NIGHT BEFORE SURGERY and the MORNING OF SURGERY with  CHG Soap.   If you chose to wash your hair, wash your hair first as usual with your normal shampoo. After you shampoo, rinse your hair and body thoroughly to remove the shampoo.  Then [employer] and genitals (private parts) with your normal soap and rinse thoroughly to remove soap.  After that Use CHG Soap as you would any other liquid soap. You can apply CHG directly to the skin and wash gently with a scrungie or a clean washcloth.   Apply the CHG Soap to your body ONLY FROM THE NECK DOWN.  Do not use on open wounds or open sores. Avoid contact with your eyes, ears, mouth and genitals (private parts). Wash Face and genitals (private parts)  with your normal soap.   Wash thoroughly, paying special attention to the area where your surgery will be performed.  Thoroughly rinse your body with warm water from the neck down.  DO NOT shower/wash with your normal soap after using and rinsing off the CHG Soap.  Pat yourself dry with a CLEAN TOWEL.  Wear CLEAN PAJAMAS to bed the night before surgery  Place CLEAN SHEETS on your bed the night before  your surgery  DO NOT SLEEP WITH PETS.   Day of Surgery:  Take a shower with CHG soap. Wear Clean/Comfortable clothing the morning of surgery Do not wear jewelry or makeup. Do not wear lotions, powders, perfumes or deodorant. Do not shave 48 hours prior to surgery.   Do not bring valuables to the hospital. Do not wear nail polish, gel polish, artificial nails, or any other type of covering on natural nails (fingers and toes) If you have artificial nails or gel coating that need to be removed by a nail salon, please have this removed prior to surgery. Artificial nails or gel coating may interfere with anesthesia's ability to adequately monitor your vital signs..   Remember to brush your teeth WITH YOUR REGULAR TOOTHPASTE.    If you received a COVID test during your pre-op visit, it is requested that you wear a mask when out in public, stay away from anyone that may not be feeling well, and notify your surgeon if you develop symptoms. If you have been in contact with anyone that has tested positive in the last 10 days, please notify your surgeon.    Please read over the following fact sheets that you were given.

## 2022-08-09 NOTE — Progress Notes (Signed)
PCP - [name], [name], MD  Cardiologist - denies  PPM/ICD - denies  Chest x-ray - N/A EKG - 08/09/2022  Stress Test - denies ECHO - denies Cardiac Cath - denies  Sleep Study - pt reports having sleep apnea years ago and using a CPAP at that time. Pt reports she no longer needs CPAP.    Fasting Blood Sugar - CBG 171 today. Pt checks her blood sugar twice a day and only takes insulin as needed. Pt reports her blood sugar is normally 140 before eating and around 160 after eating. Pt unsure of last Hgb A1c. Hgb A1c drawn today.    Last dose of GLP1 agonist-  N/A GLP1 instructions: N/A  Blood Thinner Instructions:N/A Aspirin Instructions:N/A  ERAS Protcol - ERAS + G2   COVID TEST- N/A   Anesthesia review: no  Patient denies shortness of breath, fever, cough and chest pain at PAT appointment   All instructions explained to the patient, with a verbal understanding of the material. Patient agrees to go over the instructions while at home for a better understanding.. The opportunity to ask questions was provided.

## 2022-08-10 LAB — HEMOGLOBIN A1C
Hgb A1c MFr Bld: 7.5 % — ABNORMAL HIGH (ref 4.8–5.6)
Mean Plasma Glucose: 169 mg/dL

## 2022-08-10 NOTE — Progress Notes (Addendum)
Attempted to call Dr. Alfonso Patten office and notify of UA results but no answer. Message left on triage nurse voicemail.   Addendum 1610 - Harriette Bouillon, PA with Dr. Alfonso Patten office made aware and said they will wait for urine culture results to make decision.

## 2022-08-11 LAB — URINE CULTURE: Culture: >=100000 — AB

## 2022-08-12 ENCOUNTER — Ambulatory Visit: Payer: Medicare HMO | Admitting: Podiatry

## 2022-08-12 ENCOUNTER — Telehealth: Payer: Self-pay | Admitting: Orthopedic Surgery

## 2022-08-12 ENCOUNTER — Other Ambulatory Visit: Payer: Self-pay | Admitting: Surgical

## 2022-08-12 DIAGNOSIS — M79674 Pain in right toe(s): Secondary | ICD-10-CM | POA: Diagnosis not present

## 2022-08-12 DIAGNOSIS — B351 Tinea unguium: Secondary | ICD-10-CM

## 2022-08-12 DIAGNOSIS — M79675 Pain in left toe(s): Secondary | ICD-10-CM | POA: Diagnosis not present

## 2022-08-12 MED ORDER — NITROFURANTOIN MONOHYD MACRO 100 MG PO CAPS: 100.0000 mg | ORAL_CAPSULE | Freq: Two times a day (BID) | ORAL | 0 refills | Status: AC

## 2022-08-12 NOTE — Telephone Encounter (Signed)
Someone sent me a message about this couple days and I was waiting on the cultures to come back.  Cultures are now back and I sent in a antibiotic to her pharmacy that should help with bacteriuria.  She should just take this leading up to her surgery for 5 days.  I called and left voicemail on her phone.

## 2022-08-12 NOTE — Progress Notes (Signed)
  Subjective:  Patient ID: Mckenzie Rogers, female    DOB: 02/20/1941,  MRN: [id number]  Chief Complaint  Patient presents with   Nail Problem    [facility]    81 y.o. female presents with the above complaint. History confirmed with patient.  Nails are thickened elongated and causing problems  Objective:  Physical Exam: warm, good capillary refill, no trophic changes or ulcerative lesions, normal DP and PT pulses, normal monofilament exam, and normal sensory exam. Left Foot: dystrophic yellowed discolored nail plates with subungual debris Right Foot: dystrophic yellowed discolored nail plates with subungual debris  Assessment:   1. Pain due to onychomycosis of toenails of both feet      Plan:  Patient was evaluated and treated and all questions answered.  Discussed the etiology and treatment options for the condition in detail with the patient. Educated patient on the topical and oral treatment options for mycotic nails. Recommended debridement of the nails today. Sharp and mechanical debridement performed of all painful and mycotic nails today. Nails debrided in length and thickness using a nail nipper to level of comfort. Discussed treatment options including appropriate shoe gear. Follow up as needed for painful nails.  Return in about 3 months (around 11/11/2022) for at risk diabetic foot care.

## 2022-08-12 NOTE — Telephone Encounter (Signed)
Misty Stanley [phone]) with pre-admission testing at [facility] calling to provide urinalysis results for this patient scheduled for left total knee 10-20-21 with Dr Dorene Grebe.  Patient tested positive for nitrates & many bacteria.

## 2022-08-18 ENCOUNTER — Telehealth: Payer: Self-pay | Admitting: *Deleted

## 2022-08-18 MED ORDER — TRANEXAMIC ACID 1000 MG/10ML IV SOLN
2000.0000 mg | INTRAVENOUS | Status: DC
  Filled 2022-08-18: qty 20

## 2022-08-18 NOTE — Telephone Encounter (Signed)
Ortho bundle pre-op call completed. 

## 2022-08-18 NOTE — Care Plan (Signed)
[facility] RNCM call to patient to discuss her upcoming Left total knee arthroplasty with Dr. August Saucer on 08/19/22. She is an Ortho bundle patient through [facility] and is agreeable to case management. She lives alone and has no assistance. She states her children are handicapped and cannot assist her. She would like to go to [facility] for her STR after discharge. Reviewed post op instructions. Will continue to follow for needs.

## 2022-08-19 ENCOUNTER — Encounter (HOSPITAL_COMMUNITY)
Admission: AD | Disposition: A | Discharge status: Skilled Nursing Facility | Payer: Self-pay | Source: Home / Self Care | Attending: Orthopedic Surgery

## 2022-08-19 ENCOUNTER — Inpatient Hospital Stay (HOSPITAL_COMMUNITY)
Admission: AD | Admit: 2022-08-19 | Discharge: 2022-08-26 | DRG: 470 | Disposition: A | Discharge status: Skilled Nursing Facility | Payer: Medicare HMO | Attending: Orthopedic Surgery | Admitting: Orthopedic Surgery

## 2022-08-19 ENCOUNTER — Ambulatory Visit (HOSPITAL_COMMUNITY): Payer: Medicare HMO | Admitting: Anesthesiology

## 2022-08-19 ENCOUNTER — Other Ambulatory Visit: Payer: Self-pay

## 2022-08-19 ENCOUNTER — Encounter (HOSPITAL_COMMUNITY): Payer: Self-pay | Admitting: Orthopedic Surgery

## 2022-08-19 ENCOUNTER — Ambulatory Visit (HOSPITAL_BASED_OUTPATIENT_CLINIC_OR_DEPARTMENT_OTHER): Payer: Medicare HMO | Admitting: Anesthesiology

## 2022-08-19 DIAGNOSIS — I10 Essential (primary) hypertension: Secondary | ICD-10-CM | POA: Diagnosis not present

## 2022-08-19 DIAGNOSIS — Z96652 Presence of left artificial knee joint: Secondary | ICD-10-CM

## 2022-08-19 DIAGNOSIS — Z801 Family history of malignant neoplasm of trachea, bronchus and lung: Secondary | ICD-10-CM | POA: Diagnosis not present

## 2022-08-19 DIAGNOSIS — Z800 Family history of malignant neoplasm of digestive organs: Secondary | ICD-10-CM

## 2022-08-19 DIAGNOSIS — D62 Acute posthemorrhagic anemia: Secondary | ICD-10-CM | POA: Diagnosis not present

## 2022-08-19 DIAGNOSIS — R531 Weakness: Secondary | ICD-10-CM | POA: Diagnosis not present

## 2022-08-19 DIAGNOSIS — M7989 Other specified soft tissue disorders: Secondary | ICD-10-CM | POA: Diagnosis not present

## 2022-08-19 DIAGNOSIS — Z4789 Encounter for other orthopedic aftercare: Secondary | ICD-10-CM | POA: Diagnosis not present

## 2022-08-19 DIAGNOSIS — Z841 Family history of disorders of kidney and ureter: Secondary | ICD-10-CM | POA: Diagnosis not present

## 2022-08-19 DIAGNOSIS — I959 Hypotension, unspecified: Secondary | ICD-10-CM | POA: Diagnosis not present

## 2022-08-19 DIAGNOSIS — M109 Gout, unspecified: Secondary | ICD-10-CM | POA: Diagnosis present

## 2022-08-19 DIAGNOSIS — G8918 Other acute postprocedural pain: Secondary | ICD-10-CM | POA: Diagnosis not present

## 2022-08-19 DIAGNOSIS — Z823 Family history of stroke: Secondary | ICD-10-CM | POA: Diagnosis not present

## 2022-08-19 DIAGNOSIS — Z885 Allergy status to narcotic agent status: Secondary | ICD-10-CM | POA: Diagnosis not present

## 2022-08-19 DIAGNOSIS — M10061 Idiopathic gout, right knee: Secondary | ICD-10-CM | POA: Diagnosis not present

## 2022-08-19 DIAGNOSIS — M25562 Pain in left knee: Secondary | ICD-10-CM | POA: Diagnosis not present

## 2022-08-19 DIAGNOSIS — Z886 Allergy status to analgesic agent status: Secondary | ICD-10-CM

## 2022-08-19 DIAGNOSIS — Z833 Family history of diabetes mellitus: Secondary | ICD-10-CM | POA: Diagnosis not present

## 2022-08-19 DIAGNOSIS — N289 Disorder of kidney and ureter, unspecified: Secondary | ICD-10-CM | POA: Diagnosis not present

## 2022-08-19 DIAGNOSIS — Z01818 Encounter for other preprocedural examination: Principal | ICD-10-CM

## 2022-08-19 DIAGNOSIS — G4730 Sleep apnea, unspecified: Secondary | ICD-10-CM

## 2022-08-19 DIAGNOSIS — Z888 Allergy status to other drugs, medicaments and biological substances status: Secondary | ICD-10-CM

## 2022-08-19 DIAGNOSIS — M1712 Unilateral primary osteoarthritis, left knee: Secondary | ICD-10-CM | POA: Diagnosis present

## 2022-08-19 DIAGNOSIS — Z4889 Encounter for other specified surgical aftercare: Secondary | ICD-10-CM | POA: Diagnosis not present

## 2022-08-19 DIAGNOSIS — E119 Type 2 diabetes mellitus without complications: Secondary | ICD-10-CM

## 2022-08-19 DIAGNOSIS — Z794 Long term (current) use of insulin: Secondary | ICD-10-CM | POA: Diagnosis not present

## 2022-08-19 DIAGNOSIS — Z79899 Other long term (current) drug therapy: Secondary | ICD-10-CM

## 2022-08-19 DIAGNOSIS — Z8051 Family history of malignant neoplasm of kidney: Secondary | ICD-10-CM | POA: Diagnosis not present

## 2022-08-19 DIAGNOSIS — Z743 Need for continuous supervision: Secondary | ICD-10-CM | POA: Diagnosis not present

## 2022-08-19 DIAGNOSIS — M179 Osteoarthritis of knee, unspecified: Secondary | ICD-10-CM | POA: Diagnosis present

## 2022-08-19 HISTORY — PX: TOTAL KNEE ARTHROPLASTY: SHX125

## 2022-08-19 LAB — GLUCOSE, CAPILLARY
Glucose-Capillary: 104 mg/dL — ABNORMAL HIGH (ref 70–99)
Glucose-Capillary: 106 mg/dL — ABNORMAL HIGH (ref 70–99)
Glucose-Capillary: 119 mg/dL — ABNORMAL HIGH (ref 70–99)
Glucose-Capillary: 214 mg/dL — ABNORMAL HIGH (ref 70–99)

## 2022-08-19 SURGERY — ARTHROPLASTY, KNEE, TOTAL
Anesthesia: Monitor Anesthesia Care | Site: Knee | Laterality: Left

## 2022-08-19 MED ORDER — ACETAMINOPHEN 500 MG PO TABS
1000.0000 mg | ORAL_TABLET | Freq: Four times a day (QID) | ORAL | Status: AC
  Administered 2022-08-19 – 2022-08-20 (×3): 1000 mg via ORAL
  Filled 2022-08-19 (×3): qty 2

## 2022-08-19 MED ORDER — ONDANSETRON HCL 4 MG/2ML IJ SOLN
INTRAMUSCULAR | Status: AC
  Filled 2022-08-19: qty 2

## 2022-08-19 MED ORDER — ONDANSETRON HCL 4 MG/2ML IJ SOLN
INTRAMUSCULAR | Status: DC | PRN
  Administered 2022-08-19: 4 mg via INTRAVENOUS

## 2022-08-19 MED ORDER — LABETALOL HCL 5 MG/ML IV SOLN
INTRAVENOUS | Status: AC
  Filled 2022-08-19: qty 4

## 2022-08-19 MED ORDER — DEXAMETHASONE SODIUM PHOSPHATE 10 MG/ML IJ SOLN
INTRAMUSCULAR | Status: AC
  Filled 2022-08-19: qty 1

## 2022-08-19 MED ORDER — PHENOL 1.4 % MT LIQD: 1.0000 | OROMUCOSAL | Status: DC | PRN

## 2022-08-19 MED ORDER — TRANEXAMIC ACID 1000 MG/10ML IV SOLN: INTRAVENOUS | Status: DC | PRN

## 2022-08-19 MED ORDER — FENTANYL CITRATE (PF) 100 MCG/2ML IJ SOLN
INTRAMUSCULAR | Status: AC
  Administered 2022-08-19: 50 ug via INTRAVENOUS
  Filled 2022-08-19: qty 2

## 2022-08-19 MED ORDER — ASPIRIN 81 MG PO CHEW: 81.0000 mg | CHEWABLE_TABLET | Freq: Two times a day (BID) | ORAL | Status: DC

## 2022-08-19 MED ORDER — ONDANSETRON HCL 4 MG/2ML IJ SOLN: 4.0000 mg | Freq: Four times a day (QID) | INTRAMUSCULAR | Status: DC | PRN

## 2022-08-19 MED ORDER — PROPOFOL 1000 MG/100ML IV EMUL
INTRAVENOUS | Status: AC
  Filled 2022-08-19: qty 100

## 2022-08-19 MED ORDER — DAPAGLIFLOZIN PROPANEDIOL 10 MG PO TABS
10.0000 mg | ORAL_TABLET | Freq: Every day | ORAL | Status: DC
  Administered 2022-08-20 – 2022-08-23 (×4): 10 mg via ORAL
  Filled 2022-08-19 (×4): qty 1

## 2022-08-19 MED ORDER — EPHEDRINE 5 MG/ML INJ
INTRAVENOUS | Status: AC
  Filled 2022-08-19: qty 5

## 2022-08-19 MED ORDER — METOCLOPRAMIDE HCL 5 MG PO TABS: 5.0000 mg | ORAL_TABLET | Freq: Three times a day (TID) | ORAL | Status: DC | PRN

## 2022-08-19 MED ORDER — ACETAMINOPHEN 325 MG PO TABS
325.0000 mg | ORAL_TABLET | Freq: Four times a day (QID) | ORAL | Status: DC | PRN
  Administered 2022-08-22 – 2022-08-24 (×4): 650 mg via ORAL
  Filled 2022-08-19 (×4): qty 2

## 2022-08-19 MED ORDER — HYDROMORPHONE HCL 2 MG PO TABS
1.0000 mg | ORAL_TABLET | ORAL | Status: DC | PRN
  Administered 2022-08-20 – 2022-08-25 (×13): 2 mg via ORAL
  Filled 2022-08-19 (×15): qty 1

## 2022-08-19 MED ORDER — FESOTERODINE FUMARATE ER 4 MG PO TB24
4.0000 mg | ORAL_TABLET | Freq: Every day | ORAL | Status: DC
  Administered 2022-08-20 – 2022-08-26 (×7): 4 mg via ORAL
  Filled 2022-08-19 (×7): qty 1

## 2022-08-19 MED ORDER — LACTATED RINGERS IV SOLN: INTRAVENOUS | Status: DC

## 2022-08-19 MED ORDER — ATORVASTATIN CALCIUM 80 MG PO TABS
80.0000 mg | ORAL_TABLET | Freq: Every day | ORAL | Status: DC
  Administered 2022-08-19 – 2022-08-25 (×7): 80 mg via ORAL
  Filled 2022-08-19 (×7): qty 1

## 2022-08-19 MED ORDER — VANCOMYCIN HCL 1000 MG IV SOLR
INTRAVENOUS | Status: AC
  Filled 2022-08-19: qty 20

## 2022-08-19 MED ORDER — TRANEXAMIC ACID-NACL 1000-0.7 MG/100ML-% IV SOLN
INTRAVENOUS | Status: AC
  Filled 2022-08-19: qty 100

## 2022-08-19 MED ORDER — RIVAROXABAN 10 MG PO TABS
10.0000 mg | ORAL_TABLET | Freq: Every day | ORAL | Status: DC
  Administered 2022-08-20 – 2022-08-26 (×7): 10 mg via ORAL
  Filled 2022-08-19 (×7): qty 1

## 2022-08-19 MED ORDER — BUPIVACAINE LIPOSOME 1.3 % IJ SUSP
INTRAMUSCULAR | Status: DC | PRN
  Administered 2022-08-19: 20 mL

## 2022-08-19 MED ORDER — MIDAZOLAM HCL 2 MG/2ML IJ SOLN: 1.0000 mg | Freq: Once | INTRAMUSCULAR | Status: AC

## 2022-08-19 MED ORDER — INSULIN ASPART 100 UNIT/ML IJ SOLN
0.0000 [IU] | Freq: Every day | INTRAMUSCULAR | Status: DC
  Administered 2022-08-19: 2 [IU] via SUBCUTANEOUS

## 2022-08-19 MED ORDER — CEFAZOLIN SODIUM-DEXTROSE 2-4 GM/100ML-% IV SOLN
2.0000 g | INTRAVENOUS | Status: AC
  Administered 2022-08-19: 2 g via INTRAVENOUS
  Filled 2022-08-19: qty 100

## 2022-08-19 MED ORDER — INSULIN ASPART 100 UNIT/ML IJ SOLN
0.0000 [IU] | Freq: Three times a day (TID) | INTRAMUSCULAR | Status: DC
  Administered 2022-08-20 (×2): 3 [IU] via SUBCUTANEOUS
  Administered 2022-08-21 (×3): 2 [IU] via SUBCUTANEOUS
  Administered 2022-08-22: 3 [IU] via SUBCUTANEOUS
  Administered 2022-08-22: 2 [IU] via SUBCUTANEOUS
  Administered 2022-08-23 (×2): 3 [IU] via SUBCUTANEOUS
  Administered 2022-08-23 – 2022-08-24 (×3): 2 [IU] via SUBCUTANEOUS
  Administered 2022-08-25: 4 [IU] via SUBCUTANEOUS
  Administered 2022-08-25 – 2022-08-26 (×4): 2 [IU] via SUBCUTANEOUS

## 2022-08-19 MED ORDER — IRBESARTAN 75 MG PO TABS
75.0000 mg | ORAL_TABLET | Freq: Every day | ORAL | Status: DC
  Administered 2022-08-20 – 2022-08-26 (×7): 75 mg via ORAL
  Filled 2022-08-19 (×7): qty 1

## 2022-08-19 MED ORDER — INSULIN ASPART 100 UNIT/ML IJ SOLN
4.0000 [IU] | Freq: Three times a day (TID) | INTRAMUSCULAR | Status: DC
  Administered 2022-08-20 – 2022-08-26 (×20): 4 [IU] via SUBCUTANEOUS

## 2022-08-19 MED ORDER — ALBUMIN HUMAN 5 % IV SOLN: INTRAVENOUS | Status: DC | PRN

## 2022-08-19 MED ORDER — PROPOFOL 500 MG/50ML IV EMUL
INTRAVENOUS | Status: DC | PRN
  Administered 2022-08-19: 40 ug/kg/min via INTRAVENOUS

## 2022-08-19 MED ORDER — CHLORHEXIDINE GLUCONATE 0.12 % MT SOLN: 15.0000 mL | Freq: Once | OROMUCOSAL | Status: AC

## 2022-08-19 MED ORDER — ROPIVACAINE HCL 7.5 MG/ML IJ SOLN
INTRAMUSCULAR | Status: DC | PRN
  Administered 2022-08-19: 20 mL via PERINEURAL

## 2022-08-19 MED ORDER — TRANEXAMIC ACID-NACL 1000-0.7 MG/100ML-% IV SOLN
1000.0000 mg | INTRAVENOUS | Status: AC
  Administered 2022-08-19: 1000 mg via INTRAVENOUS
  Filled 2022-08-19: qty 100

## 2022-08-19 MED ORDER — POVIDONE-IODINE 7.5 % EX SOLN: Freq: Once | CUTANEOUS | Status: DC

## 2022-08-19 MED ORDER — 0.9 % SODIUM CHLORIDE (POUR BTL) OPTIME
TOPICAL | Status: DC | PRN
  Administered 2022-08-19: 1000 mL

## 2022-08-19 MED ORDER — LIDOCAINE 2% (20 MG/ML) 5 ML SYRINGE
INTRAMUSCULAR | Status: DC | PRN
  Administered 2022-08-19: 20 mg via INTRAVENOUS

## 2022-08-19 MED ORDER — BUPIVACAINE IN DEXTROSE 0.75-8.25 % IT SOLN
INTRATHECAL | Status: DC | PRN
  Administered 2022-08-19: 1.7 mL via INTRATHECAL

## 2022-08-19 MED ORDER — CLONIDINE HCL (ANALGESIA) 100 MCG/ML EP SOLN
EPIDURAL | Status: AC
  Filled 2022-08-19: qty 10

## 2022-08-19 MED ORDER — MORPHINE SULFATE (PF) 4 MG/ML IV SOLN
INTRAVENOUS | Status: DC | PRN
  Administered 2022-08-19: 8 mg via INTRAVENOUS

## 2022-08-19 MED ORDER — BUPIVACAINE LIPOSOME 1.3 % IJ SUSP
INTRAMUSCULAR | Status: AC
  Filled 2022-08-19: qty 20

## 2022-08-19 MED ORDER — EPHEDRINE SULFATE-NACL 50-0.9 MG/10ML-% IV SOSY
PREFILLED_SYRINGE | INTRAVENOUS | Status: DC | PRN
  Administered 2022-08-19 (×2): 5 mg via INTRAVENOUS

## 2022-08-19 MED ORDER — PHENYLEPHRINE HCL-NACL 20-0.9 MG/250ML-% IV SOLN
INTRAVENOUS | Status: DC | PRN
  Administered 2022-08-19: 25 ug/min via INTRAVENOUS

## 2022-08-19 MED ORDER — VANCOMYCIN HCL 1000 MG IV SOLR
INTRAVENOUS | Status: DC | PRN
  Administered 2022-08-19: 1000 mg

## 2022-08-19 MED ORDER — BUPIVACAINE HCL (PF) 0.25 % IJ SOLN
INTRAMUSCULAR | Status: AC
  Filled 2022-08-19: qty 30

## 2022-08-19 MED ORDER — LIDOCAINE 2% (20 MG/ML) 5 ML SYRINGE
INTRAMUSCULAR | Status: AC
  Filled 2022-08-19: qty 5

## 2022-08-19 MED ORDER — GLYCOPYRROLATE PF 0.2 MG/ML IJ SOSY
PREFILLED_SYRINGE | INTRAMUSCULAR | Status: AC
  Filled 2022-08-19: qty 1

## 2022-08-19 MED ORDER — CHLORHEXIDINE GLUCONATE 0.12 % MT SOLN
OROMUCOSAL | Status: AC
  Administered 2022-08-19: 15 mL via OROMUCOSAL
  Filled 2022-08-19: qty 15

## 2022-08-19 MED ORDER — FENTANYL CITRATE (PF) 100 MCG/2ML IJ SOLN: 50.0000 ug | Freq: Once | INTRAMUSCULAR | Status: AC

## 2022-08-19 MED ORDER — CLONIDINE HCL (ANALGESIA) 100 MCG/ML EP SOLN
EPIDURAL | Status: DC | PRN
  Administered 2022-08-19: 1 mL via INTRATHECAL

## 2022-08-19 MED ORDER — FENTANYL CITRATE (PF) 100 MCG/2ML IJ SOLN: 25.0000 ug | INTRAMUSCULAR | Status: DC | PRN

## 2022-08-19 MED ORDER — POVIDONE-IODINE 10 % EX SWAB: 2.0000 | Freq: Once | CUTANEOUS | Status: DC

## 2022-08-19 MED ORDER — METHOCARBAMOL 1000 MG/10ML IJ SOLN: 500.0000 mg | Freq: Four times a day (QID) | INTRAVENOUS | Status: DC | PRN

## 2022-08-19 MED ORDER — INSULIN ASPART 100 UNIT/ML IJ SOLN: 0.0000 [IU] | INTRAMUSCULAR | Status: DC | PRN

## 2022-08-19 MED ORDER — SODIUM CHLORIDE 0.9 % IR SOLN
Status: DC | PRN
  Administered 2022-08-19: 3000 mL

## 2022-08-19 MED ORDER — IRRISEPT - 450ML BOTTLE WITH 0.05% CHG IN STERILE WATER, USP 99.95% OPTIME
TOPICAL | Status: DC | PRN
  Administered 2022-08-19: 450 mL via TOPICAL

## 2022-08-19 MED ORDER — HYDROMORPHONE HCL 1 MG/ML IJ SOLN
0.5000 mg | INTRAMUSCULAR | Status: DC | PRN
  Administered 2022-08-23: 0.5 mg via INTRAVENOUS
  Filled 2022-08-19: qty 0.5

## 2022-08-19 MED ORDER — METHOCARBAMOL 500 MG PO TABS
500.0000 mg | ORAL_TABLET | Freq: Four times a day (QID) | ORAL | Status: DC | PRN
  Administered 2022-08-19 – 2022-08-26 (×15): 500 mg via ORAL
  Filled 2022-08-19 (×16): qty 1

## 2022-08-19 MED ORDER — MORPHINE SULFATE (PF) 4 MG/ML IV SOLN
INTRAVENOUS | Status: AC
  Filled 2022-08-19: qty 2

## 2022-08-19 MED ORDER — MIDAZOLAM HCL 2 MG/2ML IJ SOLN
INTRAMUSCULAR | Status: AC
  Administered 2022-08-19: 1 mg via INTRAVENOUS
  Filled 2022-08-19: qty 2

## 2022-08-19 MED ORDER — SODIUM CHLORIDE 0.9% FLUSH
INTRAVENOUS | Status: DC | PRN
  Administered 2022-08-19: 20 mL via INTRAVENOUS

## 2022-08-19 MED ORDER — METOCLOPRAMIDE HCL 5 MG/ML IJ SOLN: 5.0000 mg | Freq: Three times a day (TID) | INTRAMUSCULAR | Status: DC | PRN

## 2022-08-19 MED ORDER — TRANEXAMIC ACID 1000 MG/10ML IV SOLN
INTRAVENOUS | Status: DC | PRN
  Administered 2022-08-19: 2000 mg via TOPICAL

## 2022-08-19 MED ORDER — ORAL CARE MOUTH RINSE: 15.0000 mL | Freq: Once | OROMUCOSAL | Status: AC

## 2022-08-19 MED ORDER — MIRABEGRON ER 25 MG PO TB24
25.0000 mg | ORAL_TABLET | Freq: Every day | ORAL | Status: DC
  Administered 2022-08-20 – 2022-08-26 (×7): 25 mg via ORAL
  Filled 2022-08-19 (×7): qty 1

## 2022-08-19 MED ORDER — SODIUM CHLORIDE 0.9 % IV SOLN: INTRAVENOUS | Status: AC

## 2022-08-19 MED ORDER — CEFAZOLIN SODIUM-DEXTROSE 2-4 GM/100ML-% IV SOLN
2.0000 g | Freq: Three times a day (TID) | INTRAVENOUS | Status: AC
  Administered 2022-08-19 – 2022-08-20 (×2): 2 g via INTRAVENOUS
  Filled 2022-08-19 (×2): qty 100

## 2022-08-19 MED ORDER — DEXAMETHASONE SODIUM PHOSPHATE 10 MG/ML IJ SOLN
INTRAMUSCULAR | Status: DC | PRN
  Administered 2022-08-19: 5 mg via INTRAVENOUS

## 2022-08-19 MED ORDER — MENTHOL 3 MG MT LOZG: 1.0000 | LOZENGE | OROMUCOSAL | Status: DC | PRN

## 2022-08-19 MED ORDER — GLYCOPYRROLATE PF 0.2 MG/ML IJ SOSY
PREFILLED_SYRINGE | INTRAMUSCULAR | Status: DC | PRN
  Administered 2022-08-19: .2 mg via INTRAVENOUS

## 2022-08-19 MED ORDER — PROPOFOL 10 MG/ML IV BOLUS
INTRAVENOUS | Status: DC | PRN
  Administered 2022-08-19 (×2): 30 mg via INTRAVENOUS

## 2022-08-19 MED ORDER — POVIDONE-IODINE 10 % EX SWAB
2.0000 | Freq: Once | CUTANEOUS | Status: AC
  Administered 2022-08-19: 2 via TOPICAL

## 2022-08-19 MED ORDER — DOCUSATE SODIUM 100 MG PO CAPS
100.0000 mg | ORAL_CAPSULE | Freq: Two times a day (BID) | ORAL | Status: DC
  Administered 2022-08-19 – 2022-08-26 (×14): 100 mg via ORAL
  Filled 2022-08-19 (×14): qty 1

## 2022-08-19 MED ORDER — ONDANSETRON HCL 4 MG PO TABS: 4.0000 mg | ORAL_TABLET | Freq: Four times a day (QID) | ORAL | Status: DC | PRN

## 2022-08-19 SURGICAL SUPPLY — 89 items
BAG COUNTER SPONGE SURGICOUNT (BAG) ×1 IMPLANT
BAG DECANTER FOR FLEXI CONT (MISCELLANEOUS) ×1 IMPLANT
BAG SPNG CNTER NS LX DISP (BAG) ×1
BANDAGE ESMARK 6X9 LF (GAUZE/BANDAGES/DRESSINGS) ×1 IMPLANT
BLADE SAG 18X100X1.27 (BLADE) ×1 IMPLANT
BLADE SAGITTAL (BLADE) ×1
BLADE SAW THK.89X75X18XSGTL (BLADE) ×1 IMPLANT
BNDG CMPR 9X6 STRL LF SNTH (GAUZE/BANDAGES/DRESSINGS) ×1
BNDG CMPR MED 10X6 ELC LF (GAUZE/BANDAGES/DRESSINGS) ×1
BNDG CMPR MED 15X6 ELC VLCR LF (GAUZE/BANDAGES/DRESSINGS) ×1
BNDG COHESIVE 6X5 TAN STRL LF (GAUZE/BANDAGES/DRESSINGS) ×1 IMPLANT
BNDG ELASTIC 6X10 VLCR STRL LF (GAUZE/BANDAGES/DRESSINGS) IMPLANT
BNDG ELASTIC 6X15 VLCR STRL LF (GAUZE/BANDAGES/DRESSINGS) ×1 IMPLANT
BNDG ESMARK 6X9 LF (GAUZE/BANDAGES/DRESSINGS) ×1
BOWL SMART MIX CTS (DISPOSABLE) IMPLANT
BSPLAT TIB 5D D CMNT STM LT (Knees) ×1 IMPLANT
CEMENT BONE SIMPLEX SPEEDSET (Cement) IMPLANT
CNTNR URN SCR LID CUP LEK RST (MISCELLANEOUS) ×1 IMPLANT
COMP FEM CMT PERS SZ5 LT (Joint) ×1 IMPLANT
COMPONENT FEM CMT PERS SZ5 LT (Joint) IMPLANT
CONT SPEC 4OZ STRL OR WHT (MISCELLANEOUS) ×1
COVER SURGICAL LIGHT HANDLE (MISCELLANEOUS) ×1 IMPLANT
CUFF TOURN SGL QUICK 34 (TOURNIQUET CUFF) ×1
CUFF TOURN SGL QUICK 42 (TOURNIQUET CUFF) IMPLANT
CUFF TRNQT CYL 34X4.125X (TOURNIQUET CUFF) ×1 IMPLANT
DRAPE INCISE IOBAN 66X45 STRL (DRAPES) IMPLANT
DRAPE ORTHO SPLIT 77X108 STRL (DRAPES) ×4
DRAPE SURG ORHT 6 SPLT 77X108 (DRAPES) ×3 IMPLANT
DRAPE U-SHAPE 47X51 STRL (DRAPES) ×1 IMPLANT
DRSG AQUACEL AG ADV 3.5X14 (GAUZE/BANDAGES/DRESSINGS) IMPLANT
DURAPREP 26ML APPLICATOR (WOUND CARE) ×2 IMPLANT
ELECT CAUTERY BLADE 6.4 (BLADE) ×1 IMPLANT
ELECT REM PT RETURN 9FT ADLT (ELECTROSURGICAL) ×1
ELECTRODE REM PT RTRN 9FT ADLT (ELECTROSURGICAL) ×1 IMPLANT
GAUZE SPONGE 4X4 12PLY STRL (GAUZE/BANDAGES/DRESSINGS) ×1 IMPLANT
GLOVE BIOGEL PI IND STRL 7.0 (GLOVE) ×1 IMPLANT
GLOVE BIOGEL PI IND STRL 8 (GLOVE) ×1 IMPLANT
GLOVE ECLIPSE 7.0 STRL STRAW (GLOVE) ×1 IMPLANT
GLOVE ECLIPSE 8.0 STRL XLNG CF (GLOVE) ×1 IMPLANT
GLOVE SURG ENC MOIS LTX SZ6.5 (GLOVE) ×3 IMPLANT
GOWN STRL REUS W/ TWL LRG LVL3 (GOWN DISPOSABLE) ×3 IMPLANT
GOWN STRL REUS W/TWL LRG LVL3 (GOWN DISPOSABLE) ×3
HANDPIECE INTERPULSE COAX TIP (DISPOSABLE) ×1
HDLS TROCR DRIL PIN KNEE 75 (PIN) ×1
HOOD PEEL AWAY FLYTE STAYCOOL (MISCELLANEOUS) ×3 IMPLANT
IMMOBILIZER KNEE 20 (SOFTGOODS)
IMMOBILIZER KNEE 20 THIGH 36 (SOFTGOODS) IMPLANT
IMMOBILIZER KNEE 22 UNIV (SOFTGOODS) IMPLANT
IMMOBILIZER KNEE 24 THIGH 36 (MISCELLANEOUS) IMPLANT
IMMOBILIZER KNEE 24 UNIV (MISCELLANEOUS)
INSERT COMP PS 16 4-5/CD LT (Insert) IMPLANT
KIT BASIN OR (CUSTOM PROCEDURE TRAY) ×1 IMPLANT
KIT TURNOVER KIT B (KITS) ×1 IMPLANT
MANIFOLD NEPTUNE II (INSTRUMENTS) ×1 IMPLANT
NDL 22X1.5 STRL (OR ONLY) (MISCELLANEOUS) ×2 IMPLANT
NDL SPNL 18GX3.5 QUINCKE PK (NEEDLE) ×1 IMPLANT
NEEDLE 22X1.5 STRL (OR ONLY) (MISCELLANEOUS) ×2 IMPLANT
NEEDLE SPNL 18GX3.5 QUINCKE PK (NEEDLE) ×1 IMPLANT
NS IRRIG 1000ML POUR BTL (IV SOLUTION) ×2 IMPLANT
PACK TOTAL JOINT (CUSTOM PROCEDURE TRAY) ×1 IMPLANT
PAD ARMBOARD 7.5X6 YLW CONV (MISCELLANEOUS) ×2 IMPLANT
PAD CAST 4YDX4 CTTN HI CHSV (CAST SUPPLIES) ×1 IMPLANT
PADDING CAST COTTON 4X4 STRL (CAST SUPPLIES) ×1
PADDING CAST COTTON 6X4 STRL (CAST SUPPLIES) ×1 IMPLANT
PIN DRILL HDLS TROCAR 75 4PK (PIN) IMPLANT
SCREW FEMALE HEX FIX 25X2.5 (ORTHOPEDIC DISPOSABLE SUPPLIES) IMPLANT
SET HNDPC FAN SPRY TIP SCT (DISPOSABLE) ×1 IMPLANT
SPIKE FLUID TRANSFER (MISCELLANEOUS) ×1 IMPLANT
SPONGE T-LAP 18X18 ~~LOC~~+RFID (SPONGE) IMPLANT
STEM POLY PAT PLY 32M KNEE (Knees) IMPLANT
STEM TIB ST PERS 14+30 (Stem) IMPLANT
STEM TIBIA 5 DEG SZ D L KNEE (Knees) IMPLANT
STRIP CLOSURE SKIN 1/2X4 (GAUZE/BANDAGES/DRESSINGS) ×2 IMPLANT
SUCTION FRAZIER HANDLE 10FR (MISCELLANEOUS) ×1
SUCTION TUBE FRAZIER 10FR DISP (MISCELLANEOUS) ×1 IMPLANT
SUT MNCRL AB 3-0 PS2 18 (SUTURE) ×1 IMPLANT
SUT VIC AB 0 CT1 27 (SUTURE) ×4
SUT VIC AB 0 CT1 27XBRD ANBCTR (SUTURE) ×3 IMPLANT
SUT VIC AB 1 CT1 36 (SUTURE) ×5 IMPLANT
SUT VIC AB 2-0 CT1 27 (SUTURE) ×6
SUT VIC AB 2-0 CT1 TAPERPNT 27 (SUTURE) ×4 IMPLANT
SYR 30ML LL (SYRINGE) ×3 IMPLANT
SYR TB 1ML LUER SLIP (SYRINGE) ×1 IMPLANT
TIBIA STEM 5 DEG SZ D L KNEE (Knees) ×1 IMPLANT
TOWEL GREEN STERILE (TOWEL DISPOSABLE) ×2 IMPLANT
TOWEL GREEN STERILE FF (TOWEL DISPOSABLE) ×2 IMPLANT
TRAY CATH 16FR W/PLASTIC CATH (SET/KITS/TRAYS/PACK) IMPLANT
WATER STERILE IRR 1000ML POUR (IV SOLUTION) IMPLANT
YANKAUER SUCT BULB TIP NO VENT (SUCTIONS) ×1 IMPLANT

## 2022-08-19 NOTE — Anesthesia Preprocedure Evaluation (Signed)
Anesthesia Evaluation  Patient identified by MRN, date of birth, ID band Patient awake    Reviewed: Allergy & Precautions, H&P , NPO status , Patient's Chart, lab work & pertinent test results  Airway Mallampati: II   Neck ROM: full    Dental   Pulmonary sleep apnea    breath sounds clear to auscultation       Cardiovascular negative cardio ROS  Rhythm:regular Rate:Normal     Neuro/Psych  PSYCHIATRIC DISORDERS  Depression       GI/Hepatic   Endo/Other  diabetes, Type 2    Renal/GU Renal InsufficiencyRenal disease     Musculoskeletal  (+) Arthritis ,    Abdominal   Peds  Hematology   Anesthesia Other Findings   Reproductive/Obstetrics                             Anesthesia Physical Anesthesia Plan  ASA: 3  Anesthesia Plan: MAC and Spinal   Post-op Pain Management: Regional block*   Induction: Intravenous  PONV Risk Score and Plan: 2 and Propofol infusion and Treatment may vary due to age or medical condition  Airway Management Planned: Simple Face Mask  Additional Equipment:   Intra-op Plan:   Post-operative Plan:   Informed Consent: I have reviewed the patients History and Physical, chart, labs and discussed the procedure including the risks, benefits and alternatives for the proposed anesthesia with the patient or authorized representative who has indicated his/her understanding and acceptance.     Dental advisory given  Plan Discussed with: CRNA, Anesthesiologist and Surgeon  Anesthesia Plan Comments:        Anesthesia Quick Evaluation

## 2022-08-19 NOTE — Anesthesia Postprocedure Evaluation (Signed)
Anesthesia Post Note  Patient: Mckenzie Rogers  Procedure(s) Performed: LEFT TOTAL KNEE ARTHROPLASTY (Left: Knee)     Patient location during evaluation: Nursing Unit Anesthesia Type: Spinal and Regional Level of consciousness: oriented and awake and alert Pain management: pain level controlled Vital Signs Assessment: post-procedure vital signs reviewed and stable Respiratory status: spontaneous breathing and respiratory function stable Cardiovascular status: blood pressure returned to baseline and stable Postop Assessment: no headache, no backache, no apparent nausea or vomiting and patient able to bend at knees Anesthetic complications: no  No notable events documented.  Last Vitals:  Vitals:   08/19/22 1715 08/19/22 1730  BP: (!) 130/57 118/65  Pulse: (!) 45 (!) 44  Resp: 12 12  Temp:    SpO2: 98% 100%    Last Pain:  Vitals:   08/19/22 1730  PainSc: 0-No pain    LLE Motor Response: No movement due to regional block (08/19/22 1730)   RLE Motor Response: No movement due to regional block (08/19/22 1730)   L Sensory Level: L2-Upper inner thigh, upper buttock (08/19/22 1730) R Sensory Level: L2-Upper inner thigh, upper buttock (08/19/22 1730)  Trevor Iha

## 2022-08-19 NOTE — Transfer of Care (Signed)
Immediate Anesthesia Transfer of Care Note  Patient: Mckenzie Rogers  Procedure(s) Performed: LEFT TOTAL KNEE ARTHROPLASTY (Left: Knee)  Patient Location: PACU  Anesthesia Type:MAC, Regional, and Spinal  Level of Consciousness: awake and alert   Airway & Oxygen Therapy: Patient Spontanous Breathing and Patient connected to face mask oxygen  Post-op Assessment: Report given to RN and Post -op Vital signs reviewed and stable  Post vital signs: Reviewed and stable  Last Vitals:  Vitals Value Taken Time  BP 128/70 08/19/22 1604  Temp    Pulse 74 08/19/22 1606  Resp 18 08/19/22 1606  SpO2 98 % 08/19/22 1606  Vitals shown include unvalidated device data.  Last Pain:  Vitals:   08/19/22 1045  PainSc: 8       Patients Stated Pain Goal: 3 (26/37/85 8850)  Complications: No notable events documented.

## 2022-08-19 NOTE — Anesthesia Procedure Notes (Signed)
Procedure Name: MAC Date/Time: 08/19/2022 1:15 PM  Performed by: Michele Rockers, CRNAPre-anesthesia Checklist: Patient identified, Emergency Drugs available, Suction available, Timeout performed and Patient being monitored Patient Re-evaluated:Patient Re-evaluated prior to induction Oxygen Delivery Method: Simple face mask

## 2022-08-19 NOTE — Progress Notes (Signed)
Orthopedic Tech Progress Note Patient Details:  DAJANIQUE ROBLEY 1940/09/17 [id number]  CPM on LLE in PACU 24.  CPM Left Knee CPM Left Knee: On Left Knee Flexion (Degrees): 40 Left Knee Extension (Degrees): 10  Post Interventions Patient Tolerated: Well Instructions Provided: Care of device  Effie Janoski Carmine Savoy 08/19/2022, 5:20 PM

## 2022-08-19 NOTE — Anesthesia Procedure Notes (Signed)
Anesthesia Regional Block: Adductor canal block   Pre-Anesthetic Checklist: , timeout performed,  Correct Patient, Correct Site, Correct Laterality,  Correct Procedure, Correct Position, site marked,  Risks and benefits discussed,  Surgical consent,  Pre-op evaluation,  At surgeon's request and post-op pain management  Laterality: Left  Prep: chloraprep       Needles:  Injection technique: Single-shot  Needle Type: Echogenic Needle     Needle Length: 9cm  Needle Gauge: 21     Additional Needles:   Narrative:  Start time: 08/19/2022 12:10 PM End time: 08/19/2022 12:18 PM Injection made incrementally with aspirations every 5 mL.  Performed by: Personally  Anesthesiologist: Achille Rich, MD  Additional Notes: Pt tolerated the procedure well.

## 2022-08-19 NOTE — H&P (Addendum)
TOTAL KNEE ADMISSION H&P  Patient is being admitted for left total knee arthroplasty.  Subjective:  Chief Complaint:left knee pain.  HPI: Mckenzie Rogers, 81 y.o. female, has a history of pain and functional disability in the left knee due to arthritis and has failed non-surgical conservative treatments for greater than 12 weeks to includeNSAID's and/or analgesics, corticosteriod injections, flexibility and strengthening excercises, use of assistive devices, and activity modification.  Onset of symptoms was gradual, starting 8 years ago with gradually worsening course since that time. The patient noted no past surgery on the left knee(s).  Patient currently rates pain in the left knee(s) at 8 out of 10 with activity. Patient has night pain, worsening of pain with activity and weight bearing, pain that interferes with activities of daily living, pain with passive range of motion, crepitus, and joint swelling.  Patient has evidence of subchondral sclerosis and joint space narrowing by imaging studies. This patient has had  a long course of treatment and conservative management without sustained relief.  Anticipates having to go to skilled nursing after surgery. . There is no active infection.  Patient Active Problem List   Diagnosis Date Noted   Controlled type 2 diabetes mellitus without complication, without long-term current use of insulin ([place]) 07/31/2021   Genetic testing 03/14/2020   Family history of pancreatic cancer    Family history of kidney cancer    Family history of lung cancer    Past Medical History:  Diagnosis Date   Chronic kidney disease    Complication of anesthesia    "hard time waking me up after foot surgery"   Depression    Diabetes ([place])    type 2   Edema    Family history of kidney cancer    Family history of lung cancer    Family history of pancreatic cancer    Osteoarthritis of both knees    Sleep apnea    "years ago" does not wear CPAP anymmore    Past  Surgical History:  Procedure Laterality Date   ABDOMINAL HYSTERECTOMY     FOOT SURGERY Left    TUBAL LIGATION      Current Facility-Administered Medications  Medication Dose Route Frequency Provider Last Rate Last Admin   ceFAZolin (ANCEF) IVPB 2g/100 mL premix  2 g Intravenous On Call to OR Magnant, Gerrianne Scale, PA-C       lactated ringers infusion   Intravenous Continuous Albertha Ghee, MD 10 mL/hr at 08/19/22 1052 New Bag at 08/19/22 1052   povidone-iodine (BETADINE) 7.5 % scrub   Topical Once Magnant, Charles L, PA-C       povidone-iodine 10 % swab 2 Application  2 Application Topical Once Magnant, Charles L, PA-C       tranexamic acid (CYKLOKAPRON) 2,000 mg in sodium chloride 0.9 % 50 mL Topical Application  123XX123 mg Topical To OR Marlou Sa, Tonna Corner, MD       tranexamic acid (CYKLOKAPRON) IVPB 1,000 mg  1,000 mg Intravenous To OR Magnant, Charles L, PA-C       Allergies  Allergen Reactions   Aspirin Other (See Comments)    Pt unsure   Clorazepate Dipotassium Other (See Comments)    "Can't function"   Ketoprofen Other (See Comments)    Pt unsure   Oxycodone-Aspirin     REACTION: tongue swells      Social History   Tobacco Use   Smoking status: Never   Smokeless tobacco: Never  Substance Use Topics   Alcohol  use: Never    Family History  Problem Relation Age of Onset   Diabetes Maternal Aunt    CVA Daughter    Kidney failure Son    Pancreatic cancer Father 2   Kidney cancer Half-Sister 75       non-smoker   Lung cancer Half-Sister 65       hx of smoking   Cancer Half-Sister 71       unknown primary     Review of Systems  Musculoskeletal:  Positive for arthralgias.  All other systems reviewed and are negative.   Objective:  Physical Exam Vitals reviewed.  HENT:     Head: Normocephalic.     Nose: Nose normal.     Mouth/Throat:     Mouth: Mucous membranes are moist.  Eyes:     Pupils: Pupils are equal, round, and reactive to light.  Cardiovascular:      Rate and Rhythm: Normal rate.     Pulses: Normal pulses.  Pulmonary:     Effort: Pulmonary effort is normal.  Abdominal:     General: Abdomen is flat.  Skin:    General: Skin is warm.     Capillary Refill: Capillary refill takes less than 2 seconds.  Neurological:     General: No focal deficit present.     Mental Status: She is alert.  Psychiatric:        Mood and Affect: Mood normal.   Left knee skin intact in the left knee region.  Dorsalis pedis pulse intact.  Ankle dorsiflexion intact.  Range of motion diminished 5 to slightly past 90 degrees.  Collaterals are stable. Vital signs in last 24 hours: Temp:  [97.7 F (36.5 C)] 97.7 F (36.5 C) (12/07 1025) Pulse Rate:  [60] 60 (12/07 1025) Resp:  [17] 17 (12/07 1025) BP: (125)/(67) 125/67 (12/07 1025) SpO2:  [99 %] 99 % (12/07 1025) Weight:  [81.6 kg] 81.6 kg (12/07 1025)  Labs:   Estimated body mass index is 29.95 kg/m as calculated from the following:   Height as of this encounter: 5\' 5"  (1.651 m).   Weight as of this encounter: 81.6 kg.   Imaging Review Plain radiographs demonstrate severe degenerative joint disease of the left knee(s). The overall alignment ismild valgus. The bone quality appears to be good for age and reported activity level.      Assessment/Plan:  End stage arthritis, left knee   The patient history, physical examination, clinical judgment of the provider and imaging studies are consistent with end stage degenerative joint disease of the left knee(s) and total knee arthroplasty is deemed medically necessary. The treatment options including medical management, injection therapy arthroscopy and arthroplasty were discussed at length. The risks and benefits of total knee arthroplasty were presented and reviewed. The risks due to aseptic loosening, infection, stiffness, patella tracking problems, thromboembolic complications and other imponderables were discussed. The patient acknowledged the  explanation, agreed to proceed with the plan and consent was signed. Patient is being admitted for inpatient treatment for surgery, pain control, PT, OT, prophylactic antibiotics, VTE prophylaxis, progressive ambulation and ADL's and discharge planning. The patient is planning to be discharged to skilled nursing facility    Anticipated LOS equal to or greater than 2 midnights due to - Age 7 and older with one or more of the following:  - Obesity  - Expected need for hospital services (PT, OT, Nursing) required for safe  discharge  - Anticipated need for postoperative skilled nursing care or inpatient  rehab  - Active co-morbidities: None OR   - Unanticipated findings during/Post Surgery: None  - Patient is a high risk of re-admission due to: Barriers to post-acute care (logistical, no family support in home)

## 2022-08-19 NOTE — Progress Notes (Signed)
ANTICOAGULATION CONSULT NOTE - Initial Consult  Pharmacy Consult for Xarelto Indication: VTE prophylaxis  Allergies  Allergen Reactions   Aspirin Other (See Comments)    Pt unsure   Clorazepate Dipotassium Other (See Comments)    "Can't function"   Ketoprofen Other (See Comments)    Pt unsure   Oxycodone-Aspirin     REACTION: tongue swells      Patient Measurements: Height: 5\' 5"  (165.1 cm) Weight: 81.6 kg (180 lb) IBW/kg (Calculated) : 57  Vital Signs: Temp: 97.7 F (36.5 C) (12/07 2058) Temp Source: Oral (12/07 2058) BP: 93/53 (12/07 01-27-2006) Pulse Rate: 71 (12/07 2058)  Labs from 08/09/22: Creatinine: 1.40 Hemoglobin: 13.2 Platelet count: 179  Estimated Creatinine Clearance: 34.4 mL/min (A) (by C-G formula based on SCr of 1.4 mg/dL (H)).   Medical History: Past Medical History:  Diagnosis Date   Chronic kidney disease    Complication of anesthesia    "hard time waking me up after foot surgery"   Depression    Diabetes ([facility])    type 2   Edema    Family history of kidney cancer    Family history of lung cancer    Family history of pancreatic cancer    Osteoarthritis of both knees    Sleep apnea    "years ago" does not wear CPAP anymmore   Assessment:  81 yr old female s/p L TKA today.  To begin Xarelto for VTE prophylaxis. Hx DVT, post-pregnancy but did not have pulmonary embolus per 03/26/22 note.  Goal of Therapy:  Appropriate Xarelto regimen for indication Monitor platelets by anticoagulation protocol: Yes   Plan:  Begin Xarelto 10 mg PO daily on 12/8 am; standard dosing. If more intense anticoagulation is preferred, could consider increasing to 15 mg daily when felt safe to dose so post-op.   CBC and bmet in am. Also using SCDs.  14/8, RPh 08/19/2022,9:16 PM

## 2022-08-19 NOTE — Brief Op Note (Signed)
   08/19/2022  4:03 PM  PATIENT:  Mckenzie Rogers  81 y.o. female  PRE-OPERATIVE DIAGNOSIS:  left knee osteoarthritis  POST-OPERATIVE DIAGNOSIS:  left knee osteoarthritis  PROCEDURE:  Procedure(s): LEFT TOTAL KNEE ARTHROPLASTY  SURGEON:  Surgeon(s): Cammy Copa, MD  ASSISTANT: Magnant PA  ANESTHESIA:   Spinal  EBL: 50 ml    Total I/O In: 1700 [I.V.:1000; IV Piggyback:700] Out: 50 [Blood:50]  BLOOD ADMINISTERED: none  DRAINS: none   LOCAL MEDICATIONS USED: Marcaine morphine clonidine Exparel vancomycin powder  SPECIMEN:  No Specimen  COUNTS:  YES  TOURNIQUET:   Total Tourniquet Time Documented: Thigh (Left) - 75 minutes Total: Thigh (Left) - 75 minutes   DICTATION: .[id number] PLAN OF CARE: Admit to inpatient   PATIENT DISPOSITION:  PACU - hemodynamically stable

## 2022-08-19 NOTE — Progress Notes (Signed)
Orthopedic Tech Progress Note Patient Details:  Mckenzie Rogers 02-04-1941 [id number]  Ortho Devices Type of Ortho Device: Bone foam zero knee Ortho Device/Splint Interventions: Ordered   Post Interventions Patient Tolerated: Well Instructions Provided: Care of device  Trinna Post 08/19/2022, 8:25 PM

## 2022-08-19 NOTE — Anesthesia Procedure Notes (Signed)
Spinal  Patient location during procedure: OR Start time: 08/19/2022 1:05 PM End time: 08/19/2022 1:08 PM Reason for block: surgical anesthesia Staffing Performed: anesthesiologist  Anesthesiologist: Achille Rich, MD Performed by: Achille Rich, MD Authorized by: Achille Rich, MD   Preanesthetic Checklist Completed: patient identified, IV checked, risks and benefits discussed, surgical consent, monitors and equipment checked, pre-op evaluation and timeout performed Spinal Block Patient position: sitting Prep: DuraPrep Patient monitoring: cardiac monitor, continuous pulse ox and blood pressure Approach: midline Location: L3-4 Injection technique: single-shot Needle Needle type: Pencan  Needle gauge: 24 G Needle length: 9 cm Assessment Sensory level: T10 Events: CSF return Additional Notes Functioning IV was confirmed and monitors were applied. Sterile prep and drape, including hand hygiene and sterile gloves were used. The patient was positioned and the spine was prepped. The skin was anesthetized with lidocaine.  Free flow of clear CSF was obtained prior to injecting local anesthetic into the CSF.  The spinal needle aspirated freely following injection.  The needle was carefully withdrawn.  The patient tolerated the procedure well.

## 2022-08-20 ENCOUNTER — Encounter (HOSPITAL_COMMUNITY): Payer: Self-pay | Admitting: Orthopedic Surgery

## 2022-08-20 ENCOUNTER — Other Ambulatory Visit (HOSPITAL_COMMUNITY): Payer: Self-pay

## 2022-08-20 LAB — BASIC METABOLIC PANEL
Anion gap: 10 (ref 5–15)
BUN: 13 mg/dL (ref 8–23)
CO2: 23 mmol/L (ref 22–32)
Calcium: 8.9 mg/dL (ref 8.9–10.3)
Chloride: 106 mmol/L (ref 98–111)
Creatinine, Ser: 1.02 mg/dL — ABNORMAL HIGH (ref 0.44–1.00)
GFR, Estimated: 56 mL/min — ABNORMAL LOW (ref >60)
Glucose, Bld: 208 mg/dL — ABNORMAL HIGH (ref 70–99)
Potassium: 4.1 mmol/L (ref 3.5–5.1)
Sodium: 139 mmol/L (ref 135–145)

## 2022-08-20 LAB — GLUCOSE, CAPILLARY
Glucose-Capillary: 199 mg/dL — ABNORMAL HIGH (ref 70–99)
Glucose-Capillary: 168 mg/dL — ABNORMAL HIGH (ref 70–99)
Glucose-Capillary: 101 mg/dL — ABNORMAL HIGH (ref 70–99)
Glucose-Capillary: 129 mg/dL — ABNORMAL HIGH (ref 70–99)

## 2022-08-20 LAB — CBC
HCT: 34 % — ABNORMAL LOW (ref 36.0–46.0)
Hemoglobin: 11.1 g/dL — ABNORMAL LOW (ref 12.0–15.0)
MCH: 31 pg (ref 26.0–34.0)
MCHC: 32.6 g/dL (ref 30.0–36.0)
MCV: 95 fL (ref 80.0–100.0)
Platelets: 143 10*3/uL — ABNORMAL LOW (ref 150–400)
RBC: 3.58 MIL/uL — ABNORMAL LOW (ref 3.87–5.11)
RDW: 14.3 % (ref 11.5–15.5)
WBC: 7.4 10*3/uL (ref 4.0–10.5)
nRBC: 0 % (ref 0.0–0.2)

## 2022-08-20 NOTE — Op Note (Unsigned)
NAME: Mckenzie Rogers, Mckenzie A. MEDICAL RECORD NO: [id number] ACCOUNT NO: [id number] DATE OF BIRTH: 1941/07/24 FACILITY: [facility] LOCATION: [id number] PHYSICIAN: Graylin Shiver. August Saucer, MD  Operative Report   DATE OF PROCEDURE: 08/19/2022  PREOPERATIVE DIAGNOSIS:  Left knee arthritis.  POSTOPERATIVE DIAGNOSIS:  Left knee arthritis.  PROCEDURE:  Left total knee replacement using cemented Biomet components, Persona size 5 narrow femur, cruciate retaining with 16 mm deep dish polyethylene insert, medial congruent along with size D tibial tray with 30 mm stem cemented and 32 mm  three-peg cemented patella.  SURGEON:  Graylin Shiver. August Saucer, MD  ASSISTANT:  Karenann Cai, PA.  INDICATIONS:  Mckenzie Rogers is a 81 year old patient with left knee arthritis refractory to nonoperative management, who presents for operative management after explanation of risks and benefits.  DESCRIPTION OF PROCEDURE:  The patient was brought to the operating room where spinal anesthetic was induced.  Preoperative antibiotics administered.  Timeout was called.  Left leg was prescrubbed with alcohol, Betadine, allowed to air dry.  Prepped with  DuraPrep solution and draped in a sterile manner.  The patient had about a 5-degree flexion contracture, but bent to about 110.  The patient did have valgus alignment preoperatively.  The left leg was prescrubbed with alcohol and Betadine, allowed to  air dry, prepped with DuraPrep solution and draped in sterile manner.  Ioban used to cover the operative field.  Leg was elevated and exsanguinated with Esmarch wrap.  Tourniquet was inflated.  Timeout was called.  Anterior approach to the knee was made.   Skin and subcutaneous tissues were sharply divided.  IrriSept solution utilized.  Median parapatellar arthrotomy was made and marked with a #1 Vicryl suture.  IrriSept solution again utilized.  Fat pad partially removed.  Minimal medial soft tissue  dissection was performed due to the patient's preoperative valgus  deformity.  Soft tissue removed from the anterior distal femur.  Lateral patellofemoral ligament was released.  Patella was everted and knee flexed.  The patient had severe  tricompartmental arthritis, worse on that lateral side.  Using intramedullary alignment, a cut was made perpendicular to the mechanical axis with approximately 7 degree slope.  Initially, a cut was made, which was 2 mm off the least affected lateral  tibial plateau and that was later revised 2 more millimeters to allow a 12 spacer to sit.  Femur was cut in 3 degrees of valgus, initially a 10 mm resection and later revised 2 more in order to take enough of that lateral condyle. This allowed a 12  spacer in full extension with 14 being slightly tighter in extension.  It should be noted that soft tissue releases were performed around the lateral aspect of the proximal tibia.  Next, the tibial tray was placed.  Aligned with the medial third of the  tibial tubercle.  Trial femur was placed after we cut the anterior, posterior and chamfer cuts in 3 degrees of external rotation.  A 5 femur was placed along with the tibial tray.  We trial reduced with a 12, 14 and 16 spacer.  The 16 spacer gave the  best stability to varus and valgus stress at 0, 30 and 90 degrees.  Patella was then cut down from 21 to 11 mm.  The 3-PEG patellar trial was placed.  With trial components in position, the patient had full extension, full flexion with no liftoff, and  excellent patellar tracking using no thumbs technique.  Trial components were removed after drilling the lug nuts on the femur and  we also did final preparations on the tibia.  Thorough irrigation with 3 liters of pulsatile irrigation was performed.   Bone plugs placed in the femur and tibia.  The true components were then cemented into position after we allowed TXA sponge to sit in the knee along with IrriSept sponge for 3 minutes.  The dried bony surfaces were then coated with cement and then the   prosthesis, which was also coated with cement were then placed into position.  The 16 spacer was placed, which gave very good stability in both extension and flexion.  Excess cement was removed.  Thorough irrigation was performed.  After cement hardening  occurred, the patient was taken through range of motion, found to have the same stability and excellent tracking parameters.  The tourniquet was released.  Bleeding points encountered were controlled using electrocautery. 3 liters of irrigating solution  was poured into the knee.  It should also be noted that vancomycin powder was placed into the tibial canal prior to cementing.  Next, the knee was closed over a bolster using #1 Vicryl suture.  Prior to final closure, the knee was again irrigated with  IrriSept solution, which was sucked out and then vancomycin powder was placed into the knee joint. The arthrotomy was closed completely using #1 Vicryl suture, and then a solution of Marcaine, morphine, clonidine was injected into the knee.  It should  also be noted that prior to placing the TXA sponge and IrriSept sponge into the knee joint, we did anesthetize the capsule medially and laterally with a combination of Exparel, Marcaine and saline.  Following injection of the Marcaine, morphine,  clonidine into the knee, the closure was performed using 0 Vicryl suture, 2-0 Vicryl suture, and 3-0 Monocryl with Steri-Strips and Aquacel dressing applied.  Ace wrap, iceman and knee immobilizer were then placed.  The patient tolerated the procedure  well without immediate complications, transferred to the recovery room in stable condition.  Mckenzie Rogers's assistance was required at all times for retraction, opening, closing, mobilization of tissue.  His assistance was a medical necessity.   MUK D: 08/19/2022 4:10:43 pm T: 08/20/2022 12:20:00 am  JOB: [id number]/ [id number]

## 2022-08-20 NOTE — TOC Benefit Eligibility Note (Signed)
Patient Product/process development scientist completed.    The patient is currently admitted and upon discharge could be taking Xarelto.  The current 30 day co-pay is $0.00.   The patient is insured through [employer]

## 2022-08-20 NOTE — Evaluation (Signed)
Physical Therapy Evaluation Patient Details Name: Mckenzie Rogers MRN: [id number] DOB: November 27, 1940 Today's Date: 08/20/2022  History of Present Illness  81 yo female with onset of failed conservative OA management had TKA on LLE 12/7 with WBAT and request to use immob.  PMHx:  OA, DM, recently working as CNA  Clinical Impression  Pt was seen for first visit of therapy with good start walking in room and demonstrating balance awareness with walker.  Pt is a nurse with recent working experience, very motivated and self aware of how to move.  Follow along with her to encourage her to move with staff to get to chair and increase endurance and control of standing balance.  She is to follow up with surgeon's recommendations but discussed her concern with having therapy inpt due to her worries about how much real assistance she can obtain.  Follow for acute PT goals as are outlined below on POC.  Returned to bed as pt is expecting to use CPM soon.     Recommendations for follow up therapy are one component of a multi-disciplinary discharge planning process, led by the attending physician.  Recommendations may be updated based on patient status, additional functional criteria and insurance authorization.  Follow Up Recommendations Follow physician's recommendations for discharge plan and follow up therapies      Assistance Recommended at Discharge Intermittent Supervision/Assistance  Patient can return home with the following  A little help with walking and/or transfers;A little help with bathing/dressing/bathroom;Assistance with cooking/housework;Direct supervision/assist for medications management;Direct supervision/assist for financial management;Assist for transportation;Help with stairs or ramp for entrance    Equipment Recommendations Rolling walker (2 wheels);BSC/3in1  Recommendations for Other Services       Functional Status Assessment Patient has had a recent decline in their functional  status and demonstrates the ability to make significant improvements in function in a reasonable and predictable amount of time.     Precautions / Restrictions Precautions Precautions: Knee Precaution Comments: wear immob OOB Required Braces or Orthoses: Knee Immobilizer - Left Knee Immobilizer - Left: On when out of bed or walking Restrictions Weight Bearing Restrictions: No      Mobility  Bed Mobility Overal bed mobility: Needs Assistance Bed Mobility: Supine to Sit, Sit to Supine     Supine to sit: Min assist Sit to supine: Min assist   General bed mobility comments: min assist to support legs out and back    Transfers Overall transfer level: Needs assistance Equipment used: Rolling walker (2 wheels) Transfers: Sit to/from Stand Sit to Stand: Min guard, Min assist           General transfer comment: min assist to launch the power up then min guard for safety    Ambulation/Gait Ambulation/Gait assistance: Min guard Gait Distance (Feet): 18 Feet Assistive device: Rolling walker (2 wheels) Gait Pattern/deviations: Step-through pattern, Decreased stride length, Step-to pattern, Decreased weight shift to left Gait velocity: reduced Gait velocity interpretation: <1.31 ft/sec, indicative of household ambulator Pre-gait activities: standing balance ck General Gait Details: pt is tending to step too far on LLE so changed sequence with better step length management regarding walking inside walker  Stairs            Wheelchair Mobility    Modified Rankin (Stroke Patients Only)       Balance Overall balance assessment: Needs assistance Sitting-balance support: Feet supported Sitting balance-Leahy Scale: Fair     Standing balance support: Bilateral upper extremity supported, During functional activity Standing balance-Leahy Scale: Poor  Standing balance comment: pt is aware of walker to support for balance                              Pertinent Vitals/Pain Pain Assessment Pain Assessment: Faces Faces Pain Scale: Hurts little more Pain Location: L knee with wb Pain Descriptors / Indicators: Operative site guarding Pain Intervention(s): Limited activity within patient's tolerance, Monitored during session, Premedicated before session, Repositioned    Home Living Family/patient expects to be discharged to:: Private residence Living Arrangements: Children Available Help at Discharge: Family;Available 24 hours/day Type of Home: House Home Access: Stairs to enter Entrance Stairs-Rails: Can reach both Entrance Stairs-Number of Steps: 2   Home Layout: One level Home Equipment: None 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    Prior Function                       Hand Dominance   Dominant Hand: Right    Extremity/Trunk Assessment   Upper Extremity Assessment Upper Extremity Assessment: Overall WFL for tasks assessed    Lower Extremity Assessment Lower Extremity Assessment: LLE deficits/detail LLE Deficits / Details: new TKA with limited strength LLE: Unable to fully assess due to immobilization LLE Coordination: decreased gross motor    Cervical / Trunk Assessment Cervical / Trunk Assessment: Kyphotic (very mild)  Communication   Communication: No difficulties  Cognition Arousal/Alertness: Awake/alert Behavior During Therapy: WFL for tasks assessed/performed Overall Cognitive Status: Within Functional Limits for tasks assessed                                 General Comments: pt asked to be allowed first to move herself        General Comments General comments (skin integrity, edema, etc.): Pt is demonstrating good awareness of sequence and safety with walker, motivated to work but is verbalizing concern that she is going to need to do rehab before home    Exercises     Assessment/Plan    PT Assessment Patient needs continued PT services  PT Problem List  Decreased strength;Decreased activity tolerance;Decreased balance;Decreased coordination;Decreased skin integrity;Pain       PT Treatment Interventions DME instruction;Gait training;Stair training;Functional mobility training;Therapeutic activities;Therapeutic exercise;Balance training;Neuromuscular re-education;Patient/family education    PT Goals (Current goals can be found in the Care Plan section)  Acute Rehab PT Goals Patient Stated Goal: to get back her independence PT Goal Formulation: With patient Time For Goal Achievement: 08/27/22 Potential to Achieve Goals: Good    Frequency 7X/week     Co-evaluation               AM-PAC PT "6 Clicks" Mobility  Outcome Measure Help needed turning from your back to your side while in a flat bed without using bedrails?: A Little Help needed moving from lying on your back to sitting on the side of a flat bed without using bedrails?: A Little Help needed moving to and from a bed to a chair (including a wheelchair)?: A Little Help needed standing up from a chair using your arms (e.g., wheelchair or bedside chair)?: A Little Help needed to walk in hospital room?: A Little Help needed climbing 3-5 steps with a railing? : A Lot 6 Click Score: 17    End of Session Equipment Utilized During Treatment: Gait belt;Left knee immobilizer Activity Tolerance: Patient tolerated treatment well;Patient limited by  pain Patient left: in bed;with call bell/phone within reach;with bed alarm set Nurse Communication: Mobility status PT Visit Diagnosis: Unsteadiness on feet (R26.81);Muscle weakness (generalized) (M62.81);Difficulty in walking, not elsewhere classified (R26.2);Pain Pain - Right/Left: Left Pain - part of body: Knee    Time: 1131-1151 PT Time Calculation (min) (ACUTE ONLY): 20 min   Charges:   PT Evaluation $PT Eval Moderate Complexity: 1 Mod         Ivar Drape 08/20/2022, 12:53 PM  Samul Dada, PT PhD Acute Rehab Dept.  Number: [facility] [id number] and [facility] [phone]

## 2022-08-20 NOTE — Progress Notes (Signed)
Physical Therapy Treatment Patient Details Name: Mckenzie Rogers MRN: [id number] DOB: 1941/05/26 Today's Date: 08/20/2022   History of Present Illness 81 yo female with onset of failed conservative OA management had TKA on LLE 12/7 with WBAT and request to use immob.  PMHx:  OA, DM, recently working as CNA    PT Comments    Pt was seen for progression of gait after trip to [facility].  Pt is requiring extra time and directions for managing transfers after fatigue, with lower surface fatigued transfers at mod assist to clear the surface.  Pt is generally depleted by short walks, and have not been able to consider stairs or other challenges.  Will continue therapy tomorrow and look at her quality of gait, with a focus on power up and stability in static standing balance.  Follow along with POC as outlined today.   Recommendations for follow up therapy are one component of a multi-disciplinary discharge planning process, led by the attending physician.  Recommendations may be updated based on patient status, additional functional criteria and insurance authorization.  Follow Up Recommendations  Follow physician's recommendations for discharge plan and follow up therapies     Assistance Recommended at Discharge Intermittent Supervision/Assistance  Patient can return home with the following A little help with walking and/or transfers;A little help with bathing/dressing/bathroom;Assistance with cooking/housework;Direct supervision/assist for medications management;Direct supervision/assist for financial management;Assist for transportation;Help with stairs or ramp for entrance   Equipment Recommendations  Rolling walker (2 wheels);BSC/3in1    Recommendations for Other Services       Precautions / Restrictions Precautions Precautions: Knee Precaution Comments: wear immob OOB Required Braces or Orthoses: Knee Immobilizer - Left Knee Immobilizer - Left: On when out of bed or  walking Restrictions Weight Bearing Restrictions: No     Mobility  Bed Mobility Overal bed mobility: Needs Assistance Bed Mobility: Supine to Sit, Sit to Supine     Supine to sit: Min assist Sit to supine: Min assist   General bed mobility comments: min assist to support legs out and back    Transfers Overall transfer level: Needs assistance Equipment used: Rolling walker (2 wheels) Transfers: Sit to/from Stand Sit to Stand: Min guard, Min assist, Mod assist           General transfer comment: min assist to launch the power up then min guard for safety at bed and mod from the [facility]    Ambulation/Gait Ambulation/Gait assistance: Min guard Gait Distance (Feet): 38 Feet Assistive device: Rolling walker (2 wheels) Gait Pattern/deviations: Step-through pattern, Decreased stride length, Step-to pattern, Decreased weight shift to left Gait velocity: reduced Gait velocity interpretation: <1.31 ft/sec, indicative of household ambulator Pre-gait activities: standing balance and control of posture General Gait Details: pt is tending to step too far on LLE so changed sequence with better step length management regarding walking inside walker   Stairs             Wheelchair Mobility    Modified Rankin (Stroke Patients Only)       Balance Overall balance assessment: Needs assistance Sitting-balance support: Feet supported Sitting balance-Leahy Scale: Fair     Standing balance support: Bilateral upper extremity supported, During functional activity Standing balance-Leahy Scale: Poor Standing balance comment: reminders for hand placement to stand                            Cognition Arousal/Alertness: Awake/alert Behavior During Therapy: [facility] for tasks assessed/performed Overall  Cognitive Status: Within Functional Limits for tasks assessed                                 General Comments: asked to try to scoot herself up in the bed         Exercises      General Comments General comments (skin integrity, edema, etc.): Pt is seen for follow up to walk and practice bathroom transfers      Pertinent Vitals/Pain Pain Assessment Pain Assessment: Faces Faces Pain Scale: Hurts even more Pain Location: L knee with wb Pain Descriptors / Indicators: Operative site guarding Pain Intervention(s): Patient requesting pain meds-RN notified, Monitored during session, Repositioned    Home Living                          Prior Function            PT Goals (current goals can now be found in the care plan section) Acute Rehab PT Goals Patient Stated Goal: to get back her independence PT Goal Formulation: With patient Time For Goal Achievement: 08/27/22 Potential to Achieve Goals: Good    Frequency    7X/week      PT Plan      Co-evaluation              AM-PAC PT "6 Clicks" Mobility   Outcome Measure  Help needed turning from your back to your side while in a flat bed without using bedrails?: A Little Help needed moving from lying on your back to sitting on the side of a flat bed without using bedrails?: A Little Help needed moving to and from a bed to a chair (including a wheelchair)?: A Little Help needed standing up from a chair using your arms (e.g., wheelchair or bedside chair)?: A Little Help needed to walk in hospital room?: A Little Help needed climbing 3-5 steps with a railing? : A Lot 6 Click Score: 17    End of Session Equipment Utilized During Treatment: Gait belt;Left knee immobilizer Activity Tolerance: Patient tolerated treatment well;Patient limited by pain Patient left: in bed;with call bell/phone within reach;with bed alarm set Nurse Communication: Mobility status PT Visit Diagnosis: Unsteadiness on feet (R26.81);Muscle weakness (generalized) (M62.81);Difficulty in walking, not elsewhere classified (R26.2);Pain Pain - Right/Left: Left Pain - part of body: Knee     Time:  0940-7680 PT Time Calculation (min) (ACUTE ONLY): 38 min  Charges:  $Gait Training: 8-22 mins $Therapeutic Activity: 23-37 mins    Ivar Drape 08/20/2022, 5:50 PM  Samul Dada, PT PhD Acute Rehab Dept. Number: [facility] [id number] and [facility] [phone]

## 2022-08-20 NOTE — Progress Notes (Signed)
  Subjective: Mckenzie Rogers is a 81 y.o. female s/p left TKA.  They are POD1.  Pt's pain is controlled.  Pt denies any complain of chest pain, shortness of breath, abdominal pain, calf pain.  Patient denies any fevers or chills. Did well with PT for first day postop, having some hypotension. No significant decline of Hgb postop  Objective: Vital signs in last 24 hours: Temp:  [98 F (36.7 C)] 98 F (36.7 C) (12/08 0835) Pulse Rate:  [55-71] 71 (12/08 1505) Resp:  [18] 18 (12/08 0835) BP: (93-104)/(59-67) 93/59 (12/08 1505) SpO2:  [100 %] 100 % (12/08 1505)  Intake/Output from previous day: 12/07 0701 - 12/08 0700 In: 2300 [I.V.:1600; IV Piggyback:700] Out: 1050 [Urine:1000; Blood:50] Intake/Output this shift: No intake/output data recorded.  Exam:  No gross blood or drainage overlying the dressing 1+ DP pulse Sensation intact distally in the operative foot Able to dorsiflex and plantarflex the operative foot No calf tenderness.  Negative Homans' sign. Able to perform straight leg raise with mild extensor lag of 10 degrees   Labs: Recent Labs    08/20/22 0413  HGB 11.1*   Recent Labs    08/20/22 0413  WBC 7.4  RBC 3.58*  HCT 34.0*  PLT 143*   Recent Labs    08/20/22 0413  NA 139  K 4.1  CL 106  CO2 23  BUN 13  CREATININE 1.02*  GLUCOSE 208*  CALCIUM 8.9   No results for input(s): "LABPT", "INR" in the last 72 hours.  Assessment/Plan: Pt is POD1 s/p TKA.    -Plan to discharge to SNF in coming days pending patient's pain and PT eval; anticipate discharge on Monday  -Xarelto for DVT prophylaxis  -WBAT with a walker  -Follow-up with Dr. August Saucer in clinic 2 weeks postoperatively    [facility] 08/20/2022, 9:04 PM

## 2022-08-20 NOTE — TOC Initial Note (Signed)
Transition of Care [facility]) - Initial/Assessment Note    Patient Details  Name: Mckenzie Rogers MRN: [id number] Date of Birth: [birth year]  [facility]) CM/SW Contact:    Lockie Pares, RN Phone Number: 08/20/2022, 11:03 AM  Clinical Narrative:                  Patient admitted for elective TKR. PT consulted POD 1. Limited support at home will likely need IP rehab vs SNF rehab.   TOC will follow for needs, recommendations, and transitions of care Expected Discharge Plan:  (ip rehab v SNF) Barriers to Discharge: Continued Medical Work up   Patient Goals and CMS Choice        Expected Discharge Plan and Services Expected Discharge Plan:  (ip rehab v SNF)       Living arrangements for the past 2 months: Single Family Home                                      Prior Living Arrangements/Services Living arrangements for the past 2 months: Single Family Home Lives with:: Self Patient language and need for interpreter reviewed:: Yes        Need for Family Participation in Patient Care: Yes (Comment) Care giver support system in place?:  (has son)   Criminal Activity/Legal Involvement Pertinent to Current Situation/Hospitalization: No - Comment as needed  Activities of Daily Living   ADL Screening (condition at time of admission) Is the patient deaf or have difficulty hearing?: No Does the patient have difficulty seeing, even when wearing glasses/contacts?: No Does the patient have difficulty concentrating, remembering, or making decisions?: No Does the patient have difficulty dressing or bathing?: No Does the patient have difficulty walking or climbing stairs?: Yes  Permission Sought/Granted                  Emotional Assessment         Alcohol / Substance Use: Not Applicable Psych Involvement: No (comment)  Admission diagnosis:  OA (osteoarthritis) of knee [M17.9] S/P TKR (total knee replacement), left [Z96.652] Patient Active  Problem List   Diagnosis Date Noted   OA (osteoarthritis) of knee 08/19/2022   S/P TKR (total knee replacement), left 08/19/2022   Controlled type 2 diabetes mellitus without complication, without long-term current use of insulin (HCC) 07/31/2021   Genetic testing 03/14/2020   Family history of pancreatic cancer    Family history of kidney cancer    Family history of lung cancer    PCP:  [name], [name], MD Pharmacy:   [facility] [phone] - [place], [place] - [address] AT [facility] OF [address] [place] [address] Phone: [phone] Fax: [phone]     Social Determinants of Health (SDOH) Interventions    Readmission Risk Interventions     No data to display

## 2022-08-20 NOTE — Inpatient Diabetes Management (Signed)
Inpatient Diabetes Program Recommendations  AACE/ADA: New Consensus Statement on Inpatient Glycemic Control (2015)  Target Ranges:  Prepandial:   less than 140 mg/dL      Peak postprandial:   less than 180 mg/dL (1-2 hours)      Critically ill patients:  140 - 180 mg/dL   Lab Results  Component Value Date   GLUCAP 168 (H) 08/20/2022   HGBA1C 7.5 (H) 08/09/2022    Latest Reference Range & Units 08/19/22 16:10 08/19/22 23:33 08/20/22 08:23 08/20/22 12:10  Glucose-Capillary 70 - 99 mg/dL 676 (H) 195 (H) 093 (H) 168 (H)   Diabetes history: DM 2 Outpatient Diabetes medications:  Basaglar 28 units daily Farxiga 10 mg daily Current orders for Inpatient glycemic control:  Novolog 0-15 units tid with meals and HS Novolog 4 units tid with meals  Inpatient Diabetes Program Recommendations:    If fasting blood sugar remains>150 mg/dL, consider adding Semglee 10 units daily.   Thanks,  Beryl Meager, RN, BC-ADM Inpatient Diabetes Coordinator Pager [phone]  (8a-5p)

## 2022-08-21 DIAGNOSIS — M1712 Unilateral primary osteoarthritis, left knee: Secondary | ICD-10-CM

## 2022-08-21 LAB — URINALYSIS, ROUTINE W REFLEX MICROSCOPIC
Bilirubin Urine: NEGATIVE
Glucose, UA: >=500 mg/dL — AB
Ketones, ur: NEGATIVE mg/dL
Leukocytes,Ua: NEGATIVE
Nitrite: POSITIVE — AB
Protein, ur: NEGATIVE mg/dL
Specific Gravity, Urine: 1.018 (ref 1.005–1.030)
pH: 5 (ref 5.0–8.0)

## 2022-08-21 LAB — GLUCOSE, CAPILLARY
Glucose-Capillary: 140 mg/dL — ABNORMAL HIGH (ref 70–99)
Glucose-Capillary: 123 mg/dL — ABNORMAL HIGH (ref 70–99)
Glucose-Capillary: 121 mg/dL — ABNORMAL HIGH (ref 70–99)
Glucose-Capillary: 113 mg/dL — ABNORMAL HIGH (ref 70–99)

## 2022-08-21 MED ORDER — AMOXICILLIN 500 MG PO CAPS
500.0000 mg | ORAL_CAPSULE | Freq: Three times a day (TID) | ORAL | Status: AC
  Administered 2022-08-21 – 2022-08-24 (×12): 500 mg via ORAL
  Filled 2022-08-21 (×12): qty 1

## 2022-08-21 NOTE — Progress Notes (Signed)
  Subjective: Patient stable.  Having some frequent urination and burning symptoms.   Objective: Vital signs in last 24 hours: Temp:  [98 F (36.7 C)-99 F (37.2 C)] 99 F (37.2 C) (12/09 0434) Pulse Rate:  [55-78] 78 (12/09 0434) Resp:  [18-19] 19 (12/09 0434) BP: (93-112)/(54-67) 112/54 (12/09 0434) SpO2:  [96 %-100 %] 100 % (12/09 0434)  Intake/Output from previous day: No intake/output data recorded. Intake/Output this shift: No intake/output data recorded.  Exam:  Sensation intact distally Intact pulses distally  Labs: Recent Labs    08/20/22 0413  HGB 11.1*   Recent Labs    08/20/22 0413  WBC 7.4  RBC 3.58*  HCT 34.0*  PLT 143*   Recent Labs    08/20/22 0413  NA 139  K 4.1  CL 106  CO2 23  BUN 13  CREATININE 1.02*  GLUCOSE 208*  CALCIUM 8.9   No results for input(s): "LABPT", "INR" in the last 72 hours.  Assessment/Plan: Plan at this time is check urine for UA CNS and start oral antibiotics for possible UTI.  Mobilize today with physical therapy and spend as much time out of bed to the chair as possible anticipate discharge to skilled nursing on Monday   Burnard Bunting 08/21/2022, 7:27 AM

## 2022-08-21 NOTE — Progress Notes (Signed)
Physical Therapy Treatment Patient Details Name: Mckenzie Rogers MRN: [id number] DOB: 1941/03/15 Today's Date: 08/21/2022   History of Present Illness 81 yo female with onset of failed conservative OA management had TKA on LLE 12/7 with WBAT and request to use immob. PMHx: OA (b knees), DM, previously worked as Lawyer.    PT Comments    Pt received in supine, lethargic after premedication for pain and reporting still in moderate to severe pain, but pt agreeable to therapy session with encouragement. Pt needing greatly increased assist, mod to maxA and greatly increased time to initiate and perform bed mobility, sit<>stand transfers from elevated surfaces and pivotal transfer to/from [facility] with RW and +2 physical assist. Pt unable to stand from elevated bed this session with only +1 maxA. Pt reports mild wooziness with sitting EOB but BP stable, after pivoting to/from [facility] and returning to supine BP soft 104/53 and RN notified. SpO2 and HR WFL on RA. Pt continues to benefit from PT services to progress toward functional mobility goals. Anticipate pt would benefit from short term low to moderate intensity post-acute rehab to work toward independence, she may also benefit from OT consult as still is needing increased assist for ADL tasks and lives alone.   Recommendations for follow up therapy are one component of a multi-disciplinary discharge planning process, led by the attending physician.  Recommendations may be updated based on patient status, additional functional criteria and insurance authorization.  Follow Up Recommendations  Follow physician's recommendations for discharge plan and follow up therapies     Assistance Recommended at Discharge Intermittent Supervision/Assistance  Patient can return home with the following A little help with walking and/or transfers;A little help with bathing/dressing/bathroom;Assistance with cooking/housework;Direct supervision/assist for medications  management;Direct supervision/assist for financial management;Assist for transportation;Help with stairs or ramp for entrance   Equipment Recommendations  Rolling walker (2 wheels);BSC/3in1    Recommendations for Other Services OT consult (she lives alone)     Precautions / Restrictions Precautions Precautions: Knee Precaution Booklet Issued: Yes (comment) Precaution Comments: Contact; wear immobilizer OOB Required Braces or Orthoses: Knee Immobilizer - Left Knee Immobilizer - Left: On when out of bed or walking Restrictions Weight Bearing Restrictions: Yes LLE Weight Bearing: Weight bearing as tolerated Other Position/Activity Restrictions: with KI donned     Mobility  Bed Mobility Overal bed mobility: Needs Assistance Bed Mobility: Supine to Sit, Sit to Supine     Supine to sit: Max assist, HOB elevated Sit to supine: Mod assist   General bed mobility comments: maxA to advance hips toward EOB; modA for BLE and trunk support with initial rise but pt having diffculty scooting; modA for return to supine due to needing BLE assist and trunk support; very effortful, pt needed at least 15 mins to get from supine to EOB initially    Transfers Overall transfer level: Needs assistance Equipment used: Rolling walker (2 wheels) Transfers: Sit to/from Stand, Bed to chair/wheelchair/BSC Sit to Stand: Max assist, +2 physical assistance, From elevated surface   Step pivot transfers: Mod assist, +2 physical assistance, From elevated surface       General transfer comment: pt asking to try on her own from very elevated height bed>RW, however pt unable to shift her weight forward enough and push enough to rise; PTA attempted with +1 maxA to stand however was unable, RN called to room and with +2 assist pt able to stand from elevated bed and BSC heights to RW. Pt able to perform her own peri-care after  urinating.    Ambulation/Gait Ambulation/Gait assistance: Mod assist, +2  safety/equipment Gait Distance (Feet): 4 Feet (to/from BSC, 9ft x2 pivoting) Assistive device: Rolling walker (2 wheels) Gait Pattern/deviations: Decreased stride length, Decreased weight shift to left, Step-to pattern, Antalgic, Leaning posteriorly, Trunk flexed Gait velocity: reduced     General Gait Details: Very slow and effortful pace, poor posture and RW management, pt quick to fatigue and needed +2 for safety due to increased flexion and fatigue throughout   Stairs Stairs:  (pt unable)           Wheelchair Mobility    Modified Rankin (Stroke Patients Only)       Balance Overall balance assessment: Needs assistance Sitting-balance support: Feet supported Sitting balance-Leahy Scale: Fair     Standing balance support: Bilateral upper extremity supported, Reliant on assistive device for balance Standing balance-Leahy Scale: Poor Standing balance comment: +2 assist for static and dynamic tasks           Cognition Arousal/Alertness: Lethargic, Suspect due to medications Behavior During Therapy: WFL for tasks assessed/performed Overall Cognitive Status: Within Functional Limits for tasks assessed             General Comments: Pt slightly more lethargic and per RN was premedicated with stronger dose than previous date due to severe pain with getting up to [facility] in AM. Pt requesting to attempt transfers/bed mobility on her own but ultimately needed heavy assist +1-2 for each task. Pt reports feeling "woozy" upon sitting up and getting up to [facility].        Exercises Total Joint Exercises Ankle Circles/Pumps: AROM, Both, 10 reps, Supine Quad Sets: AROM, Left, 10 reps, Supine Heel Slides: AROM, AAROM, Left, 10 reps, Supine (guarding due to pain) Hip ABduction/ADduction: AROM, AAROM, Left, 10 reps, Supine (pt using gt belt at times as leg lifter for AA) Straight Leg Raises: PROM, AAROM, Left, 10 reps, Supine Long Arc Quad:  (defer, pt anxious to use BSC) Goniometric  ROM: L knee flexion ROM grossly 5 deg to 40 deg in supine, pt guarding due to pain Other Exercises Other Exercises: IS x 10 reps pt achieves ~1,000 and slightly above    General Comments General comments (skin integrity, edema, etc.): BP 121/64 (81) sitting EOB initially, c/o "wooziness"; BP 104/53 (66) supine after pivoting to/from BSC; HR 77 bpm SpO2 95% on RA      Pertinent Vitals/Pain Pain Assessment Pain Assessment: 0-10 Pain Score: 6  Faces Pain Scale: Hurts even more Pain Location: 7/10 upon PTA arrival to room and with heel slides; pt reports 6/10 after pivoting to/from [facility] and resting Pain Descriptors / Indicators: Operative site guarding, Grimacing, Moaning, Sore, Sharp Pain Intervention(s): Limited activity within patient's tolerance, Monitored during session, Premedicated before session, Repositioned, Ice applied           PT Goals (current goals can now be found in the care plan section) Acute Rehab PT Goals Patient Stated Goal: to get back her independence PT Goal Formulation: With patient Time For Goal Achievement: 08/27/22 Progress towards PT goals: Progressing toward goals (slow progress due to pain/fatigue)    Frequency    7X/week      PT Plan Current plan remains appropriate       AM-PAC PT "6 Clicks" Mobility   Outcome Measure  Help needed turning from your back to your side while in a flat bed without using bedrails?: A Little Help needed moving from lying on your back to sitting on the side of  a flat bed without using bedrails?: A Lot Help needed moving to and from a bed to a chair (including a wheelchair)?: A Lot Help needed standing up from a chair using your arms (e.g., wheelchair or bedside chair)?: Total Help needed to walk in hospital room?: Total Help needed climbing 3-5 steps with a railing? : Total 6 Click Score: 10    End of Session Equipment Utilized During Treatment: Gait belt;Left knee immobilizer Activity Tolerance: Patient  limited by pain;Patient limited by fatigue (good effort) Patient left: in bed;with call bell/phone within reach;with bed alarm set;Other (comment);with SCD's reapplied (bed in chair posture, iceman donned with pillowcase to prevent it getting too cold (ice refilled)) Nurse Communication: Mobility status;Other (comment);Need for lift equipment;Precautions (consider Stedy if pt fatigued for BSC transfers) PT Visit Diagnosis: Unsteadiness on feet (R26.81);Muscle weakness (generalized) (M62.81);Difficulty in walking, not elsewhere classified (R26.2);Pain Pain - Right/Left: Left Pain - part of body: Knee     Time: 1053-1150 PT Time Calculation (min) (ACUTE ONLY): 57 min  Charges:  $Gait Training: 8-22 mins $Therapeutic Exercise: 8-22 mins $Therapeutic Activity: 23-37 mins                     Madissen Wyse P., PTA Acute Rehabilitation Services Secure Chat Preferred 9a-5:30pm Office: [place] 08/21/2022, 1:16 PM

## 2022-08-21 NOTE — Progress Notes (Signed)
Orthopedic Tech Progress Note Patient Details:  Mckenzie Rogers [birth year] [id number]  Patient ID: [name], female   DOB: 1941-01-01, 81 y.o.   MRN: [id number] The patients Rn called for a replacement knee immobilizer. I said that I would bring one up. Then I looked in the computer for an order and didn't find one. I called the Rn back and asked if they had a visible order they could show me as I need an order to give them a replacement knee immobilizer. They said they hadn't looked but that they would just let the current knee immobilizer dry and reapply it. Trinna Post 08/21/2022, 8:52 PM

## 2022-08-21 NOTE — Evaluation (Signed)
Occupational Therapy Evaluation Patient Details Name: Mckenzie Rogers MRN: [id number] DOB: Jul 21, 1941 Today's Date: 08/21/2022   History of Present Illness 81 yo female with onset of failed conservative OA management had TKA on LLE 12/7 with WBAT and request to use immob. PMHx: OA (b knees), DM, previously worked as Quarry manager.   Clinical Impression   Patient admitted for the diagnosis and procedure above.  PTA she lives with family, and remained fairly active, not needing assist with ADL, iADL.  Pain is the primary limiting factor impacting independence.  Currently she is needing Max A for basic mobility, and ADL completion from a seated position.  OT is indicated in the acute setting to maximize her functional status, and assist with the transition to SNF level of care for post acute rehab.  Hip kit training was attempted, but the patient was lethargic, and unsure about carryover.       Recommendations for follow up therapy are one component of a multi-disciplinary discharge planning process, led by the attending physician.  Recommendations may be updated based on patient status, additional functional criteria and insurance authorization.   Follow Up Recommendations  Skilled nursing-short term rehab (<3 hours/day)     Assistance Recommended at Discharge Frequent or constant Supervision/Assistance  Patient can return home with the following A lot of help with bathing/dressing/bathroom;Assistance with cooking/housework;Assist for transportation;Help with stairs or ramp for entrance;A lot of help with walking and/or transfers    Functional Status Assessment  Patient has had a recent decline in their functional status and demonstrates the ability to make significant improvements in function in a reasonable and predictable amount of time.  Equipment Recommendations  Other (comment);BSC/3in1 (Hip kit)    Recommendations for Other Services       Precautions / Restrictions  Precautions Precautions: Fall Precaution Comments: Contact; wear immobilizer OOB Required Braces or Orthoses: Knee Immobilizer - Left Knee Immobilizer - Left: On when out of bed or walking Restrictions Weight Bearing Restrictions: Yes LLE Weight Bearing: Weight bearing as tolerated      Mobility Bed Mobility Overal bed mobility: Needs Assistance Bed Mobility: Supine to Sit, Sit to Supine     Supine to sit: Max assist, HOB elevated Sit to supine: Max assist        Transfers                          Balance Overall balance assessment: Needs assistance Sitting-balance support: Feet supported Sitting balance-Leahy Scale: Fair   Postural control: Right lateral lean                                 ADL either performed or assessed with clinical judgement   ADL Overall ADL's : Needs assistance/impaired Eating/Feeding: Set up;Bed level   Grooming: Wash/dry hands;Wash/dry face;Set up;Bed level   Upper Body Bathing: Minimal assistance;Moderate assistance;Sitting   Lower Body Bathing: Bed level;Maximal assistance   Upper Body Dressing : Minimal assistance;Moderate assistance;Sitting   Lower Body Dressing: Maximal assistance;Sitting/lateral leans                       Vision Baseline Vision/History: 1 Wears glasses Patient Visual Report: No change from baseline       Perception     Praxis      Pertinent Vitals/Pain Pain Assessment Faces Pain Scale: Hurts even more Pain Location: L knoee with movement Pain Descriptors /  Indicators: Operative site guarding, Grimacing, Sharp Pain Intervention(s): Monitored during session     Hand Dominance Right   Extremity/Trunk Assessment Upper Extremity Assessment Upper Extremity Assessment: Generalized weakness;RUE deficits/detail;LUE deficits/detail RUE Deficits / Details: Decreased end range shoulder flexion RUE Sensation: WNL RUE Coordination: WNL LUE Deficits / Details: Decreased end  range shoulder flexion LUE Sensation: WNL LUE Coordination: WNL   Lower Extremity Assessment Lower Extremity Assessment: Defer to PT evaluation   Cervical / Trunk Assessment Cervical / Trunk Assessment: Kyphotic   Communication Communication Communication: No difficulties   Cognition Arousal/Alertness: Lethargic Behavior During Therapy: WFL for tasks assessed/performed Overall Cognitive Status: Within Functional Limits for tasks assessed                                 General Comments: Sleeping soundly and difficult to fully awake     General Comments   VSS on RA    Exercises     Shoulder Instructions      Home Living Family/patient expects to be discharged to:: Private residence Living Arrangements: Children Available Help at Discharge: Family;Available 24 hours/day Type of Home: House Home Access: Stairs to enter [employer] of Steps: 2 Entrance Stairs-Rails: Can reach both Home Layout: One level     Bathroom Shower/Tub: Teacher, early years/pre: Standard     Home Equipment: None          Prior Functioning/Environment Prior Level of Function : Independent/Modified Independent                        OT Problem List: Decreased strength;Decreased range of motion;Decreased activity tolerance;Impaired balance (sitting and/or standing);Decreased knowledge of use of DME or AE;Pain;Increased edema      OT Treatment/Interventions: Self-care/ADL training;Therapeutic activities;Patient/family education;Balance training;DME and/or AE instruction    OT Goals(Current goals can be found in the care plan section) Acute Rehab OT Goals Patient Stated Goal: Go to rehab and move better, stop the pain OT Goal Formulation: With patient Time For Goal Achievement: 09/03/22 Potential to Achieve Goals: Good ADL Goals Pt Will Perform Grooming: with set-up;sitting Pt Will Perform Lower Body Dressing: with min assist;sitting/lateral  leans;with adaptive equipment Pt Will Transfer to Toilet: with mod assist;stand pivot transfer;bedside commode Pt/caregiver will Perform Home Exercise Program: Increased strength;Both right and left upper extremity;With theraband;With minimal assist  OT Frequency: Min 2X/week    Co-evaluation              AM-PAC OT "6 Clicks" Daily Activity     Outcome Measure Help from another person eating meals?: None Help from another person taking care of personal grooming?: None Help from another person toileting, which includes using toliet, bedpan, or urinal?: A Lot Help from another person bathing (including washing, rinsing, drying)?: A Lot Help from another person to put on and taking off regular upper body clothing?: A Little Help from another person to put on and taking off regular lower body clothing?: A Lot 6 Click Score: 17   End of Session    Activity Tolerance: Patient limited by pain;Patient limited by lethargy Patient left: in bed;with call bell/phone within reach  OT Visit Diagnosis: Unsteadiness on feet (R26.81);Pain;Muscle weakness (generalized) (M62.81) Pain - Right/Left: Left Pain - part of body: Leg;Knee                Time: 3428-7681 OT Time Calculation (min): 15 min Charges:  OT General  Charges $OT Visit: 1 Visit OT Evaluation $OT Eval Moderate Complexity: 1 Mod  08/21/2022  RP, OTR/L  Acute Rehabilitation Services  Office:  [phone]   Metta Clines 08/21/2022, 4:44 PM

## 2022-08-22 DIAGNOSIS — Z885 Allergy status to narcotic agent status: Secondary | ICD-10-CM | POA: Diagnosis not present

## 2022-08-22 DIAGNOSIS — Z823 Family history of stroke: Secondary | ICD-10-CM | POA: Diagnosis not present

## 2022-08-22 DIAGNOSIS — G4730 Sleep apnea, unspecified: Secondary | ICD-10-CM | POA: Diagnosis present

## 2022-08-22 DIAGNOSIS — M1712 Unilateral primary osteoarthritis, left knee: Secondary | ICD-10-CM | POA: Diagnosis present

## 2022-08-22 DIAGNOSIS — Z886 Allergy status to analgesic agent status: Secondary | ICD-10-CM | POA: Diagnosis not present

## 2022-08-22 DIAGNOSIS — D62 Acute posthemorrhagic anemia: Secondary | ICD-10-CM | POA: Diagnosis not present

## 2022-08-22 DIAGNOSIS — Z888 Allergy status to other drugs, medicaments and biological substances status: Secondary | ICD-10-CM | POA: Diagnosis not present

## 2022-08-22 DIAGNOSIS — M7989 Other specified soft tissue disorders: Secondary | ICD-10-CM | POA: Diagnosis not present

## 2022-08-22 DIAGNOSIS — M179 Osteoarthritis of knee, unspecified: Secondary | ICD-10-CM | POA: Diagnosis present

## 2022-08-22 DIAGNOSIS — Z841 Family history of disorders of kidney and ureter: Secondary | ICD-10-CM | POA: Diagnosis not present

## 2022-08-22 DIAGNOSIS — Z801 Family history of malignant neoplasm of trachea, bronchus and lung: Secondary | ICD-10-CM | POA: Diagnosis not present

## 2022-08-22 DIAGNOSIS — Z8051 Family history of malignant neoplasm of kidney: Secondary | ICD-10-CM | POA: Diagnosis not present

## 2022-08-22 DIAGNOSIS — Z833 Family history of diabetes mellitus: Secondary | ICD-10-CM | POA: Diagnosis not present

## 2022-08-22 DIAGNOSIS — M109 Gout, unspecified: Secondary | ICD-10-CM | POA: Diagnosis present

## 2022-08-22 DIAGNOSIS — I959 Hypotension, unspecified: Secondary | ICD-10-CM | POA: Diagnosis not present

## 2022-08-22 DIAGNOSIS — Z800 Family history of malignant neoplasm of digestive organs: Secondary | ICD-10-CM | POA: Diagnosis not present

## 2022-08-22 DIAGNOSIS — Z79899 Other long term (current) drug therapy: Secondary | ICD-10-CM | POA: Diagnosis not present

## 2022-08-22 LAB — GLUCOSE, CAPILLARY
Glucose-Capillary: 152 mg/dL — ABNORMAL HIGH (ref 70–99)
Glucose-Capillary: 136 mg/dL — ABNORMAL HIGH (ref 70–99)
Glucose-Capillary: 108 mg/dL — ABNORMAL HIGH (ref 70–99)
Glucose-Capillary: 168 mg/dL — ABNORMAL HIGH (ref 70–99)

## 2022-08-22 NOTE — Progress Notes (Signed)
Subjective: 3 Days Post-Op Procedure(s) (LRB): LEFT TOTAL KNEE ARTHROPLASTY (Left) Patient reports pain as mild.  Feeling well.   Objective: Vital signs in last 24 hours: Temp:  [98.1 F (36.7 C)-98.8 F (37.1 C)] 98.4 F (36.9 C) (12/10 0829) Pulse Rate:  [66-80] 69 (12/10 0829) Resp:  [16-18] 16 (12/10 0829) BP: (104-132)/(53-68) 113/56 (12/10 0829) SpO2:  [95 %-99 %] 97 % (12/10 0829)  Intake/Output from previous day: 12/09 0701 - 12/10 0700 In: 600 [P.O.:600] Out: -  Intake/Output this shift: No intake/output data recorded.  Recent Labs    08/20/22 0413  HGB 11.1*   Recent Labs    08/20/22 0413  WBC 7.4  RBC 3.58*  HCT 34.0*  PLT 143*   Recent Labs    08/20/22 0413  NA 139  K 4.1  CL 106  CO2 23  BUN 13  CREATININE 1.02*  GLUCOSE 208*  CALCIUM 8.9   No results for input(s): "LABPT", "INR" in the last 72 hours.  Neurologically intact Neurovascular intact Sensation intact distally Intact pulses distally Dorsiflexion/Plantar flexion intact Incision: dressing C/D/I No cellulitis present Compartment soft   Assessment/Plan: 3 Days Post-Op Procedure(s) (LRB): LEFT TOTAL KNEE ARTHROPLASTY (Left) Advance diet Up with therapy D/C IV fluids Discharge to SNF once insurance approves and bed is available WBAT LLE ABLA- mild and stable      Cristie Hem 08/22/2022, 9:50 AM

## 2022-08-23 LAB — GLUCOSE, CAPILLARY
Glucose-Capillary: 147 mg/dL — ABNORMAL HIGH (ref 70–99)
Glucose-Capillary: 159 mg/dL — ABNORMAL HIGH (ref 70–99)
Glucose-Capillary: 167 mg/dL — ABNORMAL HIGH (ref 70–99)
Glucose-Capillary: 117 mg/dL — ABNORMAL HIGH (ref 70–99)

## 2022-08-23 LAB — SYNOVIAL CELL COUNT + DIFF, W/ CRYSTALS
Eosinophils-Synovial: 0 % (ref 0–1)
Lymphocytes-Synovial Fld: 4 % (ref 0–20)
Monocyte-Macrophage-Synovial Fluid: 7 % — ABNORMAL LOW (ref 50–90)
Neutrophil, Synovial: 89 % — ABNORMAL HIGH (ref 0–25)
WBC, Synovial: 9660 /mm3 — ABNORMAL HIGH (ref 0–200)

## 2022-08-23 MED ORDER — POLYETHYLENE GLYCOL 3350 17 G PO PACK
17.0000 g | PACK | Freq: Every day | ORAL | Status: DC
  Administered 2022-08-23 – 2022-08-26 (×4): 17 g via ORAL
  Filled 2022-08-23 (×4): qty 1

## 2022-08-23 NOTE — Progress Notes (Addendum)
Alerted by pt that son is in the ED on life support from a severe CVA(w/brain bleed).  Pt stated that she received a call form someone in the ED.  I called ED and pt is in the ED w/tx to 2 heart.  I reached out to on call MD for order to allow me to transport pt to go see son.  Waiting on call back.   [id number] Received call back from Dr Lajoyce Corners stating that its okay for pt to go see son.   0020  Received call from chaplain stating that family is with her son and they don't want her to go see him yet.  She stated that she will call me back.   0115 pts other son and 2 heart RN at pts bedside. This RN unaware of conversation, not in the room.   Pt stated that she was okay after they left. Asked pt to update me if she needs me. She seems to understand.

## 2022-08-23 NOTE — Progress Notes (Signed)
  Subjective: Mckenzie Rogers is a 81 y.o. female s/p left TKA.  They are POD4.  Pt's pain is controlled but worse today.  Pt denies any complain of chest pain, shortness of breath, abdominal pain.  Patient denies any fevers or chills. Does note increased pain in right knee since Friday.  Hurts worse than operative knee. Lot of swelling.  Difficulty ambulating with PT.  No history of gout according to her.    Objective: Vital signs in last 24 hours: Temp:  [97.7 F (36.5 C)-99.5 F (37.5 C)] 99.5 F (37.5 C) (12/11 1513) Pulse Rate:  [71-85] 77 (12/11 1513) Resp:  [16-18] 18 (12/11 0536) BP: (92-119)/(51-55) 119/55 (12/11 1513) SpO2:  [97 %-100 %] 100 % (12/11 1513)  Intake/Output from previous day: 12/10 0701 - 12/11 0700 In: 240 [P.O.:240] Out: -  Intake/Output this shift: No intake/output data recorded.  Exam:  No gross blood or drainage overlying the dressing 1+ DP pulse Sensation intact distally in the operative foot Able to dorsiflex and plantarflex the operative foot Mild to mod calf tenderness bilaterally  Negative Homans' sign. Unable to perform straight leg raise   Labs: No results for input(s): "HGB" in the last 72 hours. No results for input(s): "WBC", "RBC", "HCT", "PLT" in the last 72 hours. No results for input(s): "NA", "K", "CL", "CO2", "BUN", "CREATININE", "GLUCOSE", "CALCIUM" in the last 72 hours. No results for input(s): "LABPT", "INR" in the last 72 hours.  Assessment/Plan: Pt is POD4 s/p TKA.    -Plan to discharge to SNF in coming days pending patient's pain and PT eval  -WBAT with a walker  -Right knee aspirated of 30 cc and injected with combination of 4cc 0.25% bupivacaine and 1cc Toradol. Fluid sent off to eval for crystals primarily due to the fairly acute onset of her swelling and increased pain  -Follow-up with Dr. August Saucer in clinic 2 weeks postoperatively    [facility] 08/23/2022, 5:35 PM

## 2022-08-23 NOTE — Plan of Care (Signed)
  Problem: Pain Management: Goal: Pain level will decrease with appropriate interventions Outcome: Progressing   Problem: Fluid Volume: Goal: Ability to maintain a balanced intake and output will improve Outcome: Progressing   Problem: Metabolic: Goal: Ability to maintain appropriate glucose levels will improve Outcome: Progressing   Problem: Education: Goal: Knowledge of General Education information will improve Description: Including pain rating scale, medication(s)/side effects and non-pharmacologic comfort measures Outcome: Progressing

## 2022-08-23 NOTE — [place] FL2 (Signed)
[place] MEDICAID FL2 LEVEL OF CARE FORM     IDENTIFICATION  Patient Name: Mckenzie Rogers Birthdate: October 24, 1940 Sex: female Admission Date (Current Location): 08/19/2022  [facility] and [place] Number:  Producer, television/film/video and Address:  [facility] [place]. [facility], [address], [place], [place] [address]      Provider Number: [id number]  Attending Physician Name and Address:  Cammy Copa, MD  Relative Name and Phone Number:  SHANTEA, POULTON [phone]    Current Level of Care: Hospital Recommended Level of Care: Skilled Nursing Facility Prior Approval Number:    Date Approved/Denied:   PASRR Number:    Discharge Plan: SNF    Current Diagnoses: Patient Active Problem List   Diagnosis Date Noted   Arthritis of left knee 08/21/2022   OA (osteoarthritis) of knee 08/19/2022   S/P TKR (total knee replacement), left 08/19/2022   Controlled type 2 diabetes mellitus without complication, without long-term current use of insulin (HCC) 07/31/2021   Genetic testing 03/14/2020   Family history of pancreatic cancer    Family history of kidney cancer    Family history of lung cancer     Orientation RESPIRATION BLADDER Height & Weight     Self, Time, Situation, Place  Normal Incontinent Weight: 180 lb (81.6 kg) Height:  5\' 5"  (165.1 cm)  BEHAVIORAL SYMPTOMS/MOOD NEUROLOGICAL BOWEL NUTRITION STATUS      Continent Diet (see discharge summary)  AMBULATORY STATUS COMMUNICATION OF NEEDS Skin   Limited Assist Verbally Surgical wounds                       Personal Care Assistance Level of Assistance  Bathing, Feeding, Dressing Bathing Assistance: Maximum assistance Feeding assistance: Limited assistance Dressing Assistance: Maximum assistance     Functional Limitations Info  Sight, Hearing, Speech Sight Info: Adequate Hearing Info: Adequate Speech Info: Adequate    SPECIAL CARE FACTORS FREQUENCY  PT (By licensed PT), OT (By licensed  OT)     PT Frequency: 5x week OT Frequency: 5x week            Contractures Contractures Info: Not present    Additional Factors Info  Code Status, Allergies, Insulin Sliding Scale Code Status Info: full Allergies Info: Aspirin, Clorazepate Dipotassium, Ketoprofen, Oxycodone-aspirin   Insulin Sliding Scale Info: Novolog: see discharge summary       Current Medications (08/23/2022):  This is the current hospital active medication list Current Facility-Administered Medications  Medication Dose Route Frequency Provider Last Rate Last Admin   acetaminophen (TYLENOL) tablet 325-650 mg  325-650 mg Oral Q6H PRN Magnant, Charles L, PA-C   650 mg at 08/22/22 2313   amoxicillin (AMOXIL) capsule 500 mg  500 mg Oral Q8H 14/10/23, MD   500 mg at 08/23/22 1458   atorvastatin (LIPITOR) tablet 80 mg  80 mg Oral QHS Magnant, Charles L, PA-C   80 mg at 08/22/22 2053   dapagliflozin propanediol (FARXIGA) tablet 10 mg  10 mg Oral Daily Magnant, Charles L, PA-C   10 mg at 08/23/22 14/11/23   docusate sodium (COLACE) capsule 100 mg  100 mg Oral BID Magnant, Charles L, PA-C   100 mg at 08/23/22 0811   fesoterodine (TOVIAZ) tablet 4 mg  4 mg Oral Daily Magnant, Charles L, PA-C   4 mg at 08/23/22 14/11/23   HYDROmorphone (DILAUDID) injection 0.5 mg  0.5 mg Intravenous Q4H PRN Magnant, Charles L, PA-C       HYDROmorphone (  DILAUDID) tablet 1-2 mg  1-2 mg Oral Q4H PRN Magnant, Charles L, PA-C   2 mg at 08/23/22 1335   insulin aspart (novoLOG) injection 0-15 Units  0-15 Units Subcutaneous TID WC Magnant, Charles L, PA-C   3 Units at 08/23/22 1230   insulin aspart (novoLOG) injection 0-5 Units  0-5 Units Subcutaneous QHS Magnant, Charles L, PA-C   2 Units at 08/19/22 2346   insulin aspart (novoLOG) injection 4 Units  4 Units Subcutaneous TID WC Magnant, Charles L, PA-C   4 Units at 08/23/22 1230   irbesartan (AVAPRO) tablet 75 mg  75 mg Oral Daily Magnant, Charles L, PA-C   75 mg at 08/23/22 1610    menthol-cetylpyridinium (CEPACOL) lozenge 3 mg  1 lozenge Oral PRN Magnant, Charles L, PA-C       Or   phenol (CHLORASEPTIC) mouth spray 1 spray  1 spray Mouth/Throat PRN Magnant, Charles L, PA-C       methocarbamol (ROBAXIN) tablet 500 mg  500 mg Oral Q6H PRN Magnant, Charles L, PA-C   500 mg at 08/23/22 1337   Or   methocarbamol (ROBAXIN) 500 mg in dextrose 5 % 50 mL IVPB  500 mg Intravenous Q6H PRN Magnant, Charles L, PA-C       metoCLOPramide (REGLAN) tablet 5-10 mg  5-10 mg Oral Q8H PRN Magnant, Charles L, PA-C       Or   metoCLOPramide (REGLAN) injection 5-10 mg  5-10 mg Intravenous Q8H PRN Magnant, Charles L, PA-C       mirabegron ER (MYRBETRIQ) tablet 25 mg  25 mg Oral Daily Magnant, Charles L, PA-C   25 mg at 08/23/22 0812   ondansetron (ZOFRAN) tablet 4 mg  4 mg Oral Q6H PRN Magnant, Charles L, PA-C       Or   ondansetron (ZOFRAN) injection 4 mg  4 mg Intravenous Q6H PRN Magnant, Charles L, PA-C       polyethylene glycol (MIRALAX / GLYCOLAX) packet 17 g  17 g Oral Daily Magnant, Charles L, PA-C   17 g at 08/23/22 1231   rivaroxaban (XARELTO) tablet 10 mg  10 mg Oral Daily Cammy Copa, MD   10 mg at 08/23/22 9604     Discharge Medications: Please see discharge summary for a list of discharge medications.  Relevant Imaging Results:  Relevant Lab Results:   Additional Information SSN: [id number].  Pt is vaccinated for covid with multiple boosters.  Lorri Frederick, LCSW

## 2022-08-23 NOTE — Progress Notes (Signed)
PT Missed Treatment Note  Patient Details Name: Mckenzie Rogers MRN: [id number] DOB: [birth year]   Cancelled Treatment:    Reason Eval/Treat Not Completed: Pain limiting ability to participate. Checking for DVT in LLE and pulling fluid from RLE today per pt and nursing. Will continue to follow up as able and appropriate.   Harrel Carina, DPT, CLT  Acute Rehabilitation Services Office: [phone] (Secure chat preferred)   [name] 08/23/2022, 2:53 PM

## 2022-08-23 NOTE — Plan of Care (Signed)
  Problem: Education: Goal: Knowledge of the prescribed therapeutic regimen will improve Outcome: Progressing Goal: Individualized Educational Video(s) Outcome: Progressing   Problem: Clinical Measurements: Goal: Postoperative complications will be avoided or minimized Outcome: Progressing   Problem: Pain Management: Goal: Pain level will decrease with appropriate interventions Outcome: Progressing   Problem: Skin Integrity: Goal: Will show signs of wound healing Outcome: Progressing   Problem: Education: Goal: Ability to describe self-care measures that may prevent or decrease complications (Diabetes Survival Skills Education) will improve Outcome: Progressing Goal: Individualized Educational Video(s) Outcome: Progressing   Problem: Coping: Goal: Ability to adjust to condition or change in health will improve Outcome: Progressing   Problem: Fluid Volume: Goal: Ability to maintain a balanced intake and output will improve Outcome: Progressing   Problem: Health Behavior/Discharge Planning: Goal: Ability to identify and utilize available resources and services will improve Outcome: Progressing Goal: Ability to manage health-related needs will improve Outcome: Progressing   Problem: Metabolic: Goal: Ability to maintain appropriate glucose levels will improve Outcome: Progressing   Problem: Nutritional: Goal: Maintenance of adequate nutrition will improve Outcome: Progressing Goal: Progress toward achieving an optimal weight will improve Outcome: Progressing   Problem: Skin Integrity: Goal: Risk for impaired skin integrity will decrease Outcome: Progressing   Problem: Tissue Perfusion: Goal: Adequacy of tissue perfusion will improve Outcome: Progressing   Problem: Education: Goal: Knowledge of General Education information will improve Description: Including pain rating scale, medication(s)/side effects and non-pharmacologic comfort measures Outcome:  Progressing   Problem: Health Behavior/Discharge Planning: Goal: Ability to manage health-related needs will improve Outcome: Progressing   Problem: Clinical Measurements: Goal: Ability to maintain clinical measurements within normal limits will improve Outcome: Progressing Goal: Will remain free from infection Outcome: Progressing Goal: Diagnostic test results will improve Outcome: Progressing Goal: Respiratory complications will improve Outcome: Progressing Goal: Cardiovascular complication will be avoided Outcome: Progressing   Problem: Activity: Goal: Risk for activity intolerance will decrease Outcome: Progressing   Problem: Nutrition: Goal: Adequate nutrition will be maintained Outcome: Progressing   Problem: Coping: Goal: Level of anxiety will decrease Outcome: Progressing   Problem: Elimination: Goal: Will not experience complications related to bowel motility Outcome: Progressing Goal: Will not experience complications related to urinary retention Outcome: Progressing   Problem: Pain Managment: Goal: General experience of comfort will improve Outcome: Progressing   Problem: Safety: Goal: Ability to remain free from injury will improve Outcome: Progressing   Problem: Skin Integrity: Goal: Risk for impaired skin integrity will decrease Outcome: Progressing   Problem: Activity: Goal: Ability to avoid complications of mobility impairment will improve Outcome: Not Progressing Goal: Range of joint motion will improve Outcome: Not Progressing

## 2022-08-24 ENCOUNTER — Inpatient Hospital Stay (HOSPITAL_COMMUNITY): Payer: Medicare HMO

## 2022-08-24 DIAGNOSIS — M7989 Other specified soft tissue disorders: Secondary | ICD-10-CM

## 2022-08-24 LAB — GLUCOSE, CAPILLARY
Glucose-Capillary: 116 mg/dL — ABNORMAL HIGH (ref 70–99)
Glucose-Capillary: 134 mg/dL — ABNORMAL HIGH (ref 70–99)
Glucose-Capillary: 122 mg/dL — ABNORMAL HIGH (ref 70–99)
Glucose-Capillary: 148 mg/dL — ABNORMAL HIGH (ref 70–99)

## 2022-08-24 NOTE — Progress Notes (Signed)
Pt. Back on the unit

## 2022-08-24 NOTE — Progress Notes (Signed)
Chaplain checked in with pt's nurse regarding whether to take his pt to see her son who is gravely ill.  The mother's children have requested she not be notified at this time.  Family is gathering at her son's bedside.    Chaplain on standby for mother throughout the night if needed.  Mckenzie Rogers Chaplain

## 2022-08-24 NOTE — Progress Notes (Cosign Needed Addendum)
Pt has been drinking fluids, BP rechecked, read 97/44 on Dinamap with HR of 68. Manual recheck completed and reading was 97/60 w/ radial pulse of 70. Pt encouraged to cont drinking as much fluids as possible while awake, pt verbalized understanding. Will cont to monitor.

## 2022-08-24 NOTE — Progress Notes (Signed)
Pt. Refused CPM mobility request some time alone with family

## 2022-08-24 NOTE — Progress Notes (Signed)
Pt's BP reading on Dinamap was 88/55, HR 76, manual check 88/58, called placed to on-call, Dr. Ophelia Charter, advised to encourage fluids and cont to monitor as low readings are likely due to pain meds, okay as long as pulse rate normal and producing urine, will cont to monitor.

## 2022-08-24 NOTE — Progress Notes (Signed)
OT Cancellation Note  Patient Details Name: Mckenzie Rogers MRN: [id number] DOB: Apr 12, 1941   Cancelled Treatment:    Reason Eval/Treat Not Completed: Patient at procedure or test/ unavailable: pt undergoing LE doppler to check for DVT. Also of note test previous day on RLE to check for gout. Checked chart at 1251 and results of dopplar not yet available. Will continue efforts.   Theodoro Clock 08/24/2022, 12:51 PM

## 2022-08-24 NOTE — Progress Notes (Signed)
PT Cancellation Note  Patient Details Name: [name] MRN: [id number] DOB: 1940-11-15   Cancelled Treatment:    Reason Eval/Treat Not Completed: (P) Patient at procedure or test/unavailable (pt undergoing LE doppler to check for DVT. Also of note test previous day on RLE to check for gout. Will defer PT tx until later in day when results in for doppler if appropriate at that time.) Will continue efforts per PT plan of care as schedule permits.   Dorathy Kinsman Kamillah Didonato 08/24/2022, 10:37 AM

## 2022-08-24 NOTE — Progress Notes (Signed)
Pt. Off unit 2 heart

## 2022-08-24 NOTE — Care Plan (Signed)
Received call from bedside nurse stating patient's son is in hospital in Medical ICU at this time and wanted to make sure it was ok to take her to see him. Updated Dr. August Saucer and his PA regarding this and gave ok for staff to escort patient to see her son.

## 2022-08-24 NOTE — Progress Notes (Signed)
  Subjective: Patient stable.  Pain controlled.  Was walking in the hall with physical therapy.  Ready to go to skilled nursing tomorrow   Objective: Vital signs in last 24 hours: Temp:  [98.1 F (36.7 C)-99 F (37.2 C)] 98.1 F (36.7 C) (12/12 1420) Pulse Rate:  [73-79] 79 (12/12 1420) Resp:  [17-20] 17 (12/12 1420) BP: (103-125)/(47-56) 103/48 (12/12 1420) SpO2:  [95 %-98 %] 98 % (12/12 1420)  Intake/Output from previous day: 12/11 0701 - 12/12 0700 In: 240 [P.O.:240] Out: -  Intake/Output this shift: Total I/O In: 240 [P.O.:240] Out: -   Exam:  Sensation intact distally Intact pulses distally Dorsiflexion/Plantar flexion intact  Labs: No results for input(s): "HGB" in the last 72 hours. No results for input(s): "WBC", "RBC", "HCT", "PLT" in the last 72 hours. No results for input(s): "NA", "K", "CL", "CO2", "BUN", "CREATININE", "GLUCOSE", "CALCIUM" in the last 72 hours. No results for input(s): "LABPT", "INR" in the last 72 hours.  Assessment/Plan: Plan at this time is discharge to skilled nursing tomorrow.  CT imaging tonight.  Iceman to follow CPM.   Burnard Bunting 08/24/2022, 4:45 PM

## 2022-08-24 NOTE — TOC Initial Note (Addendum)
Transition of Care [facility] - Initial/Assessment Note    Patient Details  Name: Mckenzie Rogers MRN: [id number] Date of Birth: [birth year]  Transition of Care [facility]) CM/SW Contact:    Joanne Chars, LCSW Phone Number: 08/24/2022, 1:25 PM  Clinical Narrative:   CSW met with pt and son Brenton Grills regarding DC recommendation for SNF.  Permission given to speak with Brenton Grills and pt other 2 children: Butch Penny, did not get the 3rd name.  Pt agreeable to SNF but reports she has already been in touch with [facility] at [facility] and has a bed waiting for her.  Permission given to send referral to [facility].  Pt lives with her children, no current services in place.     Pt has been vaccinated for covid with boosters.  Referral sent to [facility], contacted [facility] to review.  [facility] does offer bed.       PASSR received: [id number]         Expected Discharge Plan: [place] Barriers to Discharge: Continued Medical Work up, SNF Pending bed offer   Patient Goals and CMS Choice Patient states their goals for this hospitalization and ongoing recovery are:: do things for myself   Choice offered to / list presented to : Patient (pt reports she has bed at [facility] in place)  Expected Discharge Plan and Services Expected Discharge Plan: [place] In-house Referral: Clinical Social Work   Post Acute Care Choice: [place] arrangements for the past 2 months: [place]                                      Prior Living Arrangements/Services Living arrangements for the past 2 months: Single Family Home Lives with:: Adult Children Patient language and need for interpreter reviewed:: Yes Do you feel safe going back to the place where you live?: Yes      Need for Family Participation in Patient Care: Yes (Comment) Care giver support system in place?: Yes (comment) Current home services: Other (comment) (none) Criminal Activity/Legal  Involvement Pertinent to Current Situation/Hospitalization: No - Comment as needed  Activities of Daily Living Home Assistive Devices/Equipment: CBG Meter ADL Screening (condition at time of admission) Patient's cognitive ability adequate to safely complete daily activities?: Yes Is the patient deaf or have difficulty hearing?: No Does the patient have difficulty seeing, even when wearing glasses/contacts?: No Does the patient have difficulty concentrating, remembering, or making decisions?: No Patient able to express need for assistance with ADLs?: Yes Does the patient have difficulty dressing or bathing?: No Independently performs ADLs?: Yes (appropriate for developmental age) Does the patient have difficulty walking or climbing stairs?: Yes Weakness of Legs: Right Weakness of Arms/Hands: None  Permission Sought/Granted Permission sought to share information with : Family Supports Permission granted to share information with : Yes, Verbal Permission Granted  Share Information with NAME: 3 children: Jermaine,Donna, didn't get 3rd child name  Permission granted to share info w AGENCY: SNF        Emotional Assessment Appearance:: Appears stated age Attitude/Demeanor/Rapport: Engaged Affect (typically observed): Appropriate, Pleasant Orientation: : Oriented to Self, Oriented to Place, Oriented to  Time, Oriented to Situation Alcohol / Substance Use: Not Applicable Psych Involvement: No (comment)  Admission diagnosis:  OA (osteoarthritis) of knee [M17.9] S/P TKR (total knee replacement), left [Z96.652] Patient Active Problem List   Diagnosis Date Noted   Arthritis of left  knee 08/21/2022   OA (osteoarthritis) of knee 08/19/2022   S/P TKR (total knee replacement), left 08/19/2022   Controlled type 2 diabetes mellitus without complication, without long-term current use of insulin ([place]) 07/31/2021   Genetic testing 03/14/2020   Family history of pancreatic cancer    Family history  of kidney cancer    Family history of lung cancer    PCP:  [name], Fransico Him, MD Pharmacy:   [facility] [place], [address] AT [place] [address] Phone: [phone] Fax: [phone]     Social Determinants of Health (SDOH) Interventions    Readmission Risk Interventions     No data to display

## 2022-08-24 NOTE — Progress Notes (Signed)
Physical Therapy Treatment Patient Details Name: SAFAA STINGLEY MRN: [id number] DOB: 28-Dec-1940 Today's Date: 08/24/2022   History of Present Illness 81 yo female with onset of failed conservative OA management had TKA on LLE 12/7 with WBAT and request to use immob. PMHx: OA (b knees), DM, previously worked as Lawyer.    PT Comments     Pt reports her R knee feels much better since aspiration and injection yesterday, also states LLE feels better. Pt overall requiring min-mod assist for short-distance gait with use of RW, pt complaining of pain and weakness which limited her tolerance today. Pt progressing towards goals, agreeable to d/c to SNF.     Recommendations for follow up therapy are one component of a multi-disciplinary discharge planning process, led by the attending physician.  Recommendations may be updated based on patient status, additional functional criteria and insurance authorization.  Follow Up Recommendations  Follow physician's recommendations for discharge plan and follow up therapies     Assistance Recommended at Discharge Intermittent Supervision/Assistance  Patient can return home with the following Assistance with cooking/housework;Direct supervision/assist for medications management;Direct supervision/assist for financial management;Assist for transportation;Help with stairs or ramp for entrance;A lot of help with walking and/or transfers;A lot of help with bathing/dressing/bathroom   Equipment Recommendations  Rolling walker (2 wheels);BSC/3in1    Recommendations for Other Services       Precautions / Restrictions Precautions Precautions: Fall Precaution Comments: Contact; wear immobilizer OOB Required Braces or Orthoses: Knee Immobilizer - Left Knee Immobilizer - Left: On when out of bed or walking Restrictions Weight Bearing Restrictions: No LLE Weight Bearing: Weight bearing as tolerated     Mobility  Bed Mobility Overal bed mobility: Needs  Assistance Bed Mobility: Supine to Sit, Sit to Supine     Supine to sit: Min assist Sit to supine: Min assist   General bed mobility comments: assist for LLE progression and lift back into bed, very increased time and pt trying to do as much as she can for herself    Transfers Overall transfer level: Needs assistance Equipment used: Rolling walker (2 wheels) Transfers: Sit to/from Stand Sit to Stand: Mod assist, From elevated surface           General transfer comment: assist for power up, rise, and steadying. Increased time to transition hands from EOB to RW    Ambulation/Gait Ambulation/Gait assistance: Min assist Gait Distance (Feet): 20 Feet Assistive device: Rolling walker (2 wheels) Gait Pattern/deviations: Decreased stride length, Decreased weight shift to left, Antalgic, Leaning posteriorly, Trunk flexed, Step-through pattern Gait velocity: decr     General Gait Details: assist to steady, cues for upright posture, placement in RW   Stairs             Wheelchair Mobility    Modified Rankin (Stroke Patients Only)       Balance Overall balance assessment: Needs assistance Sitting-balance support: Feet supported Sitting balance-Leahy Scale: Fair     Standing balance support: Bilateral upper extremity supported, Reliant on assistive device for balance Standing balance-Leahy Scale: Poor                              Cognition Arousal/Alertness: Awake/alert Behavior During Therapy: WFL for tasks assessed/performed Overall Cognitive Status: Within Functional Limits for tasks assessed  Exercises Total Joint Exercises Ankle Circles/Pumps: AROM, Both, 10 reps, Supine Quad Sets: AROM, Left, 10 reps, Supine Heel Slides: AAROM, Left, 10 reps, Supine Goniometric ROM: L knee AAROM 5-30 deg limited by pt guarding and pain    General Comments        Pertinent Vitals/Pain Pain  Assessment Pain Assessment: Faces Faces Pain Scale: Hurts even more Pain Location: L knee with movement Pain Descriptors / Indicators: Operative site guarding, Grimacing, Sharp Pain Intervention(s): Limited activity within patient's tolerance, Monitored during session, Repositioned, RN gave pain meds during session    Home Living                          Prior Function            PT Goals (current goals can now be found in the care plan section) Acute Rehab PT Goals Patient Stated Goal: to get back her independence PT Goal Formulation: With patient Time For Goal Achievement: 08/27/22 Progress towards PT goals: Progressing toward goals    Frequency    7X/week      PT Plan Current plan remains appropriate    Co-evaluation              AM-PAC PT "6 Clicks" Mobility   Outcome Measure  Help needed turning from your back to your side while in a flat bed without using bedrails?: A Little Help needed moving from lying on your back to sitting on the side of a flat bed without using bedrails?: A Little Help needed moving to and from a bed to a chair (including a wheelchair)?: A Lot Help needed standing up from a chair using your arms (e.g., wheelchair or bedside chair)?: A Lot Help needed to walk in hospital room?: A Lot Help needed climbing 3-5 steps with a railing? : Total 6 Click Score: 13    End of Session Equipment Utilized During Treatment: Gait belt;Left knee immobilizer Activity Tolerance: Patient limited by pain;Patient limited by fatigue Patient left: in bed;with call bell/phone within reach;with bed alarm set Nurse Communication: Mobility status PT Visit Diagnosis: Unsteadiness on feet (R26.81);Muscle weakness (generalized) (M62.81);Difficulty in walking, not elsewhere classified (R26.2);Pain Pain - Right/Left: Left Pain - part of body: Knee     Time: 2671-2458 PT Time Calculation (min) (ACUTE ONLY): 25 min  Charges:  $Gait Training: 8-22  mins $Therapeutic Activity: 8-22 mins                     Marye Round, PT DPT Acute Rehabilitation Services Pager [phone]  Office [phone]    Dani Danis E Christain [place] 08/24/2022, 4:07 PM

## 2022-08-25 LAB — GLUCOSE, CAPILLARY
Glucose-Capillary: 123 mg/dL — ABNORMAL HIGH (ref 70–99)
Glucose-Capillary: 134 mg/dL — ABNORMAL HIGH (ref 70–99)
Glucose-Capillary: 126 mg/dL — ABNORMAL HIGH (ref 70–99)
Glucose-Capillary: 141 mg/dL — ABNORMAL HIGH (ref 70–99)

## 2022-08-25 NOTE — Progress Notes (Signed)
OT Cancellation Note  Patient Details Name: Mckenzie Rogers MRN: [id number] DOB: [birth year]   Cancelled Treatment:    Reason Eval/Treat Not Completed: Pain limiting ability to participate;Other (comment) (pt waiting to be transported to ICU to "say goodbye to her son.") pt greeted supine in bed reporting pain, and reporting that she was waiting to be transported to ICU to see her son. Offered to assist pt with transfer to w/c however pt wanting pain meds first. Will continue efforts as pt wanting to work on functional mobility but seems to have a lot going on right now.   Lenor Derrick., COTA/L Acute Rehabilitation Services [phone]   Mckenzie Rogers 08/25/2022, 3:58 PM

## 2022-08-25 NOTE — Plan of Care (Signed)
  Problem: Education: Goal: Knowledge of the prescribed therapeutic regimen will improve Outcome: Progressing   Problem: Activity: Goal: Ability to avoid complications of mobility impairment will improve Outcome: Progressing   Problem: Pain Management: Goal: Pain level will decrease with appropriate interventions Outcome: Progressing   

## 2022-08-25 NOTE — TOC Progression Note (Signed)
Transition of Care [facility]) - Progression Note    Patient Details  Name: Mckenzie Rogers MRN: [id number] Date of Birth: 18-Oct-1940  Transition of [facility]) CM/SW Contact  Lorri Frederick, LCSW Phone Number: 08/25/2022, 10:13 AM  Clinical Narrative:   [facility] request submitted in [place] and approved: [id number], 3 days: 12/13-12/15.    Expected Discharge Plan: Skilled Nursing Facility Barriers to Discharge: Continued Medical Work up, SNF Pending bed offer  Expected Discharge Plan and Services Expected Discharge Plan: Skilled Nursing Facility In-house Referral: Clinical Social Work   Post Acute Care Choice: Skilled Nursing Facility Living arrangements for the past 2 months: Single Family Home                                       Social Determinants of Health (SDOH) Interventions    Readmission Risk Interventions     No data to display

## 2022-08-25 NOTE — Progress Notes (Signed)
Mobility Specialist Progress Note   08/25/22 1826  Mobility  Activity Transferred from chair to bed  Level of Assistance Other (Comment) (+3A)  Assistive Device Other (Comment) (HHA)  Distance Ambulated (ft) 2 ft  LLE Weight Bearing WBAT  Activity Response Tolerated fair  $Mobility charge 1 Mobility   RN requesting assistance to get pt from transport chair to bed. Pt c/o of LE's feeling numb for sitting so long and required [place] (+3A) to pick pt up and placed in bed. Then rolled pt left and right to adjust pads under them. Left w/ call bell in reach and RN's still present  Frederico Hamman Mobility Specialist Please contact via SecureChat or  Rehab office at [phone]

## 2022-08-25 NOTE — Progress Notes (Signed)
Physical Therapy Treatment Patient Details Name: Mckenzie Rogers MRN: [id number] DOB: 14-Aug-1941 Today's Date: 08/25/2022   History of Present Illness 81 yo female with onset of failed conservative OA management had TKA on LLE 12/7 with WBAT and request to use immob. PMHx: OA (b knees), DM, previously worked as Lawyer.    PT Comments    Received pt semi-reclined in bed with family present at bedside. Pt emotional regarding son's decline in medical status on 2H. Donned L knee immobilizer with total A and pt required light mod A to transfer to EOB with HOB elevated and use of bedrails with assist for LLE management and trunk control. Pt requested to get "washed up". Removed dirty gown and pt performed upper body bathing sitting EOB with set up assist (therapist provided total A to wash back) and donned clean shirt with supervision. Pt politely declined standing due to pain, stating "I'll try it later", and requested to remain sitting EOB with daughter present. Acute PT to cont to follow.    Recommendations for follow up therapy are one component of a multi-disciplinary discharge planning process, led by the attending physician.  Recommendations may be updated based on patient status, additional functional criteria and insurance authorization.  Follow Up Recommendations  Follow physician's recommendations for discharge plan and follow up therapies     Assistance Recommended at Discharge Frequent or constant Supervision/Assistance  Patient can return home with the following Assistance with cooking/housework;Direct supervision/assist for medications management;Direct supervision/assist for financial management;Assist for transportation;Help with stairs or ramp for entrance;A lot of help with walking and/or transfers;A lot of help with bathing/dressing/bathroom   Equipment Recommendations  Rolling walker (2 wheels);BSC/3in1    Recommendations for Other Services OT consult     Precautions /  Restrictions Precautions Precautions: Fall;Knee Precaution Comments: Contact; wear immobilizer OOB Required Braces or Orthoses: Knee Immobilizer - Left Knee Immobilizer - Left: On when out of bed or walking Restrictions Weight Bearing Restrictions: No     Mobility  Bed Mobility Overal bed mobility: Needs Assistance Bed Mobility: Rolling, Supine to Sit Rolling: Mod assist   Supine to sit: Mod assist     General bed mobility comments: HOB elevated and reliance on bedrails for assist. Pt required assist for LLE management and trunk control Patient Response: Cooperative  Transfers                   General transfer comment: politely refused due to pain    Ambulation/Gait               General Gait Details: declined due to pain   Stairs             Wheelchair Mobility    Modified Rankin (Stroke Patients Only)       Balance Overall balance assessment: Modified Independent Sitting-balance support: Feet supported, No upper extremity supported Sitting balance-Leahy Scale: Good Sitting balance - Comments: sitting EOB upon therapist's exit with daughter present at bedside.       Standing balance comment: declined to attempt standing                            Cognition Arousal/Alertness: Awake/alert Behavior During Therapy: WFL for tasks assessed/performed Overall Cognitive Status: Within Functional Limits for tasks assessed  Exercises      General Comments General comments (skin integrity, edema, etc.): Pt emotional regarding her son's medical state, stating she does not want anyone to "pull the plug" until family is present at bedside      Pertinent Vitals/Pain Pain Assessment Pain Assessment: 0-10 Pain Score: 5  Pain Location: L knee with mobility Pain Descriptors / Indicators: Operative site guarding, Grimacing, Discomfort Pain Intervention(s): Limited activity  within patient's tolerance, Monitored during session, Premedicated before session, Repositioned    Home Living                          Prior Function            PT Goals (current goals can now be found in the care plan section) Acute Rehab PT Goals Patient Stated Goal: to get back her independence PT Goal Formulation: With patient Time For Goal Achievement: 08/27/22 Potential to Achieve Goals: Good Progress towards PT goals: Progressing toward goals    Frequency    7X/week      PT Plan Current plan remains appropriate    Co-evaluation              AM-PAC PT "6 Clicks" Mobility   Outcome Measure  Help needed turning from your back to your side while in a flat bed without using bedrails?: A Little Help needed moving from lying on your back to sitting on the side of a flat bed without using bedrails?: A Lot Help needed moving to and from a bed to a chair (including a wheelchair)?: A Lot Help needed standing up from a chair using your arms (e.g., wheelchair or bedside chair)?: A Lot Help needed to walk in hospital room?: A Lot Help needed climbing 3-5 steps with a railing? : A Lot 6 Click Score: 13    End of Session Equipment Utilized During Treatment: Left knee immobilizer Activity Tolerance: Patient limited by pain;Other (comment) (distracted with medical state of her son) Patient left: in bed;with call bell/phone within reach;with family/visitor present (sitting EOB) Nurse Communication: Mobility status PT Visit Diagnosis: Unsteadiness on feet (R26.81);Muscle weakness (generalized) (M62.81);Difficulty in walking, not elsewhere classified (R26.2);Pain Pain - Right/Left: Left Pain - part of body: Knee     Time: 3244-0102 PT Time Calculation (min) (ACUTE ONLY): 27 min  Charges:  $Therapeutic Activity: 23-37 mins                     Raechel Chute PT, DPT  Alfonso Patten 08/25/2022, 1:21 PM

## 2022-08-25 NOTE — Progress Notes (Signed)
This chaplain responded to unit page for Pt. spiritual care. The chaplain understands the Pt. is experiencing anticipatory grief as the family plans for the Pt. son-Darryl compassionate extubation on 2H. The Pt. daughter is at the bedside.   The chaplain listened reflectively to the Pt. The Pt. desire for strengthened communication was put in place through the chaplain's phone call to [facility] for clarification. The chaplain appreciates RN-Teresa update. The chaplain learned and shared with the family changes in Darryl care will not happen until family has had time to gather at the bedside and receive an update from Dr. Katrinka Blazing. The update brought comfort to the Pt.  The chaplain shared with the RN, the family is coordinating a middle of the afternoon arrival of additional family.   The chaplain left the Pt. room as the medical team provided daily care and repositioning of the Pt. in the bed. The chaplain extended the invitation for F/U spiritual care.  Chaplain Stephanie Acre [phone]

## 2022-08-25 NOTE — Care Management Important Message (Signed)
Important Message  Patient Details  Name: Mckenzie Rogers MRN: [id number] Date of Birth: October 20, 1940   Medicare Important Message Given:  Yes     Sherilyn Banker 08/25/2022, 12:38 PM

## 2022-08-26 DIAGNOSIS — M25562 Pain in left knee: Secondary | ICD-10-CM | POA: Diagnosis not present

## 2022-08-26 DIAGNOSIS — K5900 Constipation, unspecified: Secondary | ICD-10-CM | POA: Diagnosis not present

## 2022-08-26 DIAGNOSIS — M109 Gout, unspecified: Secondary | ICD-10-CM | POA: Diagnosis not present

## 2022-08-26 DIAGNOSIS — Z4789 Encounter for other orthopedic aftercare: Secondary | ICD-10-CM | POA: Diagnosis not present

## 2022-08-26 DIAGNOSIS — M25461 Effusion, right knee: Secondary | ICD-10-CM | POA: Diagnosis not present

## 2022-08-26 DIAGNOSIS — R531 Weakness: Secondary | ICD-10-CM | POA: Diagnosis not present

## 2022-08-26 DIAGNOSIS — R52 Pain, unspecified: Secondary | ICD-10-CM | POA: Diagnosis not present

## 2022-08-26 DIAGNOSIS — Z79899 Other long term (current) drug therapy: Secondary | ICD-10-CM | POA: Diagnosis not present

## 2022-08-26 DIAGNOSIS — Z7901 Long term (current) use of anticoagulants: Secondary | ICD-10-CM | POA: Diagnosis not present

## 2022-08-26 DIAGNOSIS — M79605 Pain in left leg: Secondary | ICD-10-CM | POA: Diagnosis not present

## 2022-08-26 DIAGNOSIS — I10 Essential (primary) hypertension: Secondary | ICD-10-CM | POA: Diagnosis not present

## 2022-08-26 DIAGNOSIS — Z4889 Encounter for other specified surgical aftercare: Secondary | ICD-10-CM | POA: Diagnosis not present

## 2022-08-26 DIAGNOSIS — M10061 Idiopathic gout, right knee: Secondary | ICD-10-CM | POA: Diagnosis not present

## 2022-08-26 DIAGNOSIS — Z743 Need for continuous supervision: Secondary | ICD-10-CM | POA: Diagnosis not present

## 2022-08-26 DIAGNOSIS — M25561 Pain in right knee: Secondary | ICD-10-CM | POA: Diagnosis not present

## 2022-08-26 DIAGNOSIS — M1712 Unilateral primary osteoarthritis, left knee: Secondary | ICD-10-CM | POA: Diagnosis not present

## 2022-08-26 DIAGNOSIS — R601 Generalized edema: Secondary | ICD-10-CM | POA: Diagnosis not present

## 2022-08-26 DIAGNOSIS — Z794 Long term (current) use of insulin: Secondary | ICD-10-CM | POA: Diagnosis not present

## 2022-08-26 DIAGNOSIS — G8929 Other chronic pain: Secondary | ICD-10-CM | POA: Diagnosis not present

## 2022-08-26 DIAGNOSIS — E785 Hyperlipidemia, unspecified: Secondary | ICD-10-CM | POA: Diagnosis not present

## 2022-08-26 DIAGNOSIS — Z7401 Bed confinement status: Secondary | ICD-10-CM | POA: Diagnosis not present

## 2022-08-26 DIAGNOSIS — E119 Type 2 diabetes mellitus without complications: Secondary | ICD-10-CM | POA: Diagnosis not present

## 2022-08-26 DIAGNOSIS — Z96652 Presence of left artificial knee joint: Secondary | ICD-10-CM | POA: Diagnosis not present

## 2022-08-26 DIAGNOSIS — M25569 Pain in unspecified knee: Secondary | ICD-10-CM | POA: Diagnosis not present

## 2022-08-26 LAB — GLUCOSE, CAPILLARY
Glucose-Capillary: 130 mg/dL — ABNORMAL HIGH (ref 70–99)
Glucose-Capillary: 114 mg/dL — ABNORMAL HIGH (ref 70–99)
Glucose-Capillary: 114 mg/dL — ABNORMAL HIGH (ref 70–99)

## 2022-08-26 MED ORDER — METHOCARBAMOL 500 MG PO TABS: 500.0000 mg | ORAL_TABLET | Freq: Three times a day (TID) | ORAL | 0 refills | Status: AC | PRN

## 2022-08-26 MED ORDER — HYDROMORPHONE HCL 2 MG PO TABS: 1.0000 mg | ORAL_TABLET | Freq: Three times a day (TID) | ORAL | 0 refills | Status: AC | PRN

## 2022-08-26 MED ORDER — RIVAROXABAN 10 MG PO TABS: 10.0000 mg | ORAL_TABLET | Freq: Every day | ORAL | 0 refills | Status: DC

## 2022-08-26 MED ORDER — DOCUSATE SODIUM 100 MG PO CAPS: 100.0000 mg | ORAL_CAPSULE | Freq: Two times a day (BID) | ORAL | 0 refills | Status: AC

## 2022-08-26 NOTE — Plan of Care (Signed)
  Problem: Education: Goal: Knowledge of the prescribed therapeutic regimen will improve Outcome: Adequate for Discharge Goal: Individualized Educational Video(s) Outcome: Adequate for Discharge   Problem: Activity: Goal: Ability to avoid complications of mobility impairment will improve Outcome: Adequate for Discharge Goal: Range of joint motion will improve Outcome: Adequate for Discharge   Problem: Clinical Measurements: Goal: Postoperative complications will be avoided or minimized Outcome: Adequate for Discharge   Problem: Pain Management: Goal: Pain level will decrease with appropriate interventions Outcome: Adequate for Discharge   Problem: Skin Integrity: Goal: Will show signs of wound healing Outcome: Adequate for Discharge   Problem: Education: Goal: Ability to describe self-care measures that may prevent or decrease complications (Diabetes Survival Skills Education) will improve Outcome: Adequate for Discharge Goal: Individualized Educational Video(s) Outcome: Adequate for Discharge   Problem: Coping: Goal: Ability to adjust to condition or change in health will improve Outcome: Adequate for Discharge   Problem: Fluid Volume: Goal: Ability to maintain a balanced intake and output will improve Outcome: Adequate for Discharge   Problem: Health Behavior/Discharge Planning: Goal: Ability to identify and utilize available resources and services will improve Outcome: Adequate for Discharge Goal: Ability to manage health-related needs will improve Outcome: Adequate for Discharge   Problem: Metabolic: Goal: Ability to maintain appropriate glucose levels will improve Outcome: Adequate for Discharge   Problem: Nutritional: Goal: Maintenance of adequate nutrition will improve Outcome: Adequate for Discharge Goal: Progress toward achieving an optimal weight will improve Outcome: Adequate for Discharge   Problem: Skin Integrity: Goal: Risk for impaired skin  integrity will decrease Outcome: Adequate for Discharge   Problem: Tissue Perfusion: Goal: Adequacy of tissue perfusion will improve Outcome: Adequate for Discharge   Problem: Education: Goal: Knowledge of General Education information will improve Description: Including pain rating scale, medication(s)/side effects and non-pharmacologic comfort measures Outcome: Adequate for Discharge   Problem: Health Behavior/Discharge Planning: Goal: Ability to manage health-related needs will improve Outcome: Adequate for Discharge   Problem: Clinical Measurements: Goal: Ability to maintain clinical measurements within normal limits will improve Outcome: Adequate for Discharge Goal: Will remain free from infection Outcome: Adequate for Discharge Goal: Diagnostic test results will improve Outcome: Adequate for Discharge Goal: Respiratory complications will improve Outcome: Adequate for Discharge Goal: Cardiovascular complication will be avoided Outcome: Adequate for Discharge   Problem: Activity: Goal: Risk for activity intolerance will decrease Outcome: Adequate for Discharge   Problem: Nutrition: Goal: Adequate nutrition will be maintained Outcome: Adequate for Discharge   Problem: Coping: Goal: Level of anxiety will decrease Outcome: Adequate for Discharge   Problem: Elimination: Goal: Will not experience complications related to bowel motility Outcome: Adequate for Discharge Goal: Will not experience complications related to urinary retention Outcome: Adequate for Discharge   Problem: Pain Managment: Goal: General experience of comfort will improve Outcome: Adequate for Discharge   Problem: Safety: Goal: Ability to remain free from injury will improve Outcome: Adequate for Discharge   Problem: Skin Integrity: Goal: Risk for impaired skin integrity will decrease Outcome: Adequate for Discharge   

## 2022-08-26 NOTE — Progress Notes (Addendum)
Pt had a very small bowel movement.  Pt stated that she hasn't had one since Thursday.    Pt spoke about losing her son today.  Offered condolences.  Pt seemed appreciative.

## 2022-08-26 NOTE — Discharge Summary (Signed)
Physician Discharge Summary      Patient ID: Mckenzie Rogers MRN: [id number] DOB/AGE: June 09, 1941 81 y.o.  Admit date: 08/19/2022 Discharge date: 08/26/2022  Admission Diagnoses:  Principal Problem:   OA (osteoarthritis) of knee Active Problems:   S/P TKR (total knee replacement), left   Arthritis of left knee   Discharge Diagnoses:  Same  Surgeries: Procedure(s): LEFT TOTAL KNEE ARTHROPLASTY on 08/19/2022   Consultants:   Discharged Condition: Stable  Hospital Course: Mckenzie Rogers is an 81 y.o. female who was admitted 08/19/2022 with a chief complaint of left knee pain, and found to have a diagnosis of left knee osteoarthritis.  They were brought to the operating room on 08/19/2022 and underwent the above named procedures.  Pt awoke from anesthesia without complication and was transferred to the floor. On POD1, patient's pain was overall controlled but she struggled with ambulation.  She improved in regards to her pain control of the left knee over the weekend but had increased pain in her right knee with right knee bothering her more than the left knee.  This required aspiration which was negative for any septic arthritis but did show gout crystals.  This was treated with Toradol injection and she had significant improvement the following day on 08/24/2022.  Unfortunately, her son was admitted and on life support for brain bleed which was very stressful for her understandably.  Life support was withdrawn the following day and she remained in the hospital to support him and her family and be there for withdrawal of support.  She is now ready for discharge to skilled nursing facility.  Discharged on 08/26/2022.  She will follow-up in 1 week.  Xarelto for DVT prophylaxis.  Continue Dilaudid tablets for pain control but at a significantly lower frequency and hold these tablets if blood pressure drops too low.  Antibiotics given:  Anti-infectives (From admission, onward)    Start      Dose/Rate Route Frequency Ordered Stop   08/21/22 0800  amoxicillin (AMOXIL) capsule 500 mg        500 mg Oral Every 8 hours 08/21/22 0730 08/24/22 2138   08/19/22 2000  ceFAZolin (ANCEF) IVPB 2g/100 mL premix        2 g 200 mL/hr over 30 Minutes Intravenous Every 8 hours 08/19/22 1847 08/21/22 0732   08/19/22 1415  vancomycin (VANCOCIN) powder  Status:  Discontinued          As needed 08/19/22 1416 08/19/22 1600   08/19/22 1015  ceFAZolin (ANCEF) IVPB 2g/100 mL premix        2 g 200 mL/hr over 30 Minutes Intravenous On call to O.R. 08/19/22 1011 08/19/22 1345     .  Recent vital signs:  Vitals:   08/25/22 2049 08/26/22 0825  BP: (!) 93/54 (!) 96/53  Pulse: 78 71  Resp: 16 18  Temp: (!) 97.5 F (36.4 C) 98.5 F (36.9 C)  SpO2: 98% 98%    Recent laboratory studies:  Results for orders placed or performed during the hospital encounter of 08/19/22  Body fluid culture w Gram Stain   Specimen: Synovial Fluid  Result Value Ref Range   Specimen Description FLUID    Special Requests  RIGHT KNEE    Gram Stain      FEW WBC PRESENT, PREDOMINANTLY PMN NO ORGANISMS SEEN    Culture      NO GROWTH 3 DAYS Performed at [facility] [address], [place], [place] [address]  Report Status PENDING   Glucose, capillary  Result Value Ref Range   Glucose-Capillary 119 (H) 70 - 99 mg/dL   Comment 1 Notify RN    Comment 2 Document in Chart   Glucose, capillary  Result Value Ref Range   Glucose-Capillary 104 (H) 70 - 99 mg/dL  Glucose, capillary  Result Value Ref Range   Glucose-Capillary 106 (H) 70 - 99 mg/dL  CBC  Result Value Ref Range   WBC 7.4 4.0 - 10.5 K/uL   RBC 3.58 (L) 3.87 - 5.11 MIL/uL   Hemoglobin 11.1 (L) 12.0 - 15.0 g/dL   HCT 34.0 (L) 36.0 - 46.0 %   MCV 95.0 80.0 - 100.0 fL   MCH 31.0 26.0 - 34.0 pg   MCHC 32.6 30.0 - 36.0 g/dL   RDW 14.3 11.5 - 15.5 %   Platelets 143 (L) 150 - 400 K/uL   nRBC 0.0 0.0 - 0.2 %  Basic metabolic panel  Result  Value Ref Range   Sodium 139 135 - 145 mmol/L   Potassium 4.1 3.5 - 5.1 mmol/L   Chloride 106 98 - 111 mmol/L   CO2 23 22 - 32 mmol/L   Glucose, Bld 208 (H) 70 - 99 mg/dL   BUN 13 8 - 23 mg/dL   Creatinine, Ser 1.02 (H) 0.44 - 1.00 mg/dL   Calcium 8.9 8.9 - 10.3 mg/dL   GFR, Estimated 56 (L) >60 mL/min   Anion gap 10 5 - 15  Glucose, capillary  Result Value Ref Range   Glucose-Capillary 214 (H) 70 - 99 mg/dL   Comment 1 Notify RN    Comment 2 Document in Chart   Glucose, capillary  Result Value Ref Range   Glucose-Capillary 199 (H) 70 - 99 mg/dL   Comment 1 Notify RN   Glucose, capillary  Result Value Ref Range   Glucose-Capillary 168 (H) 70 - 99 mg/dL  Glucose, capillary  Result Value Ref Range   Glucose-Capillary 101 (H) 70 - 99 mg/dL  Glucose, capillary  Result Value Ref Range   Glucose-Capillary 129 (H) 70 - 99 mg/dL   Comment 1 Notify RN    Comment 2 Document in Chart   Urinalysis, Routine w reflex microscopic Urine, Clean Catch  Result Value Ref Range   Color, Urine YELLOW YELLOW   APPearance HAZY (A) CLEAR   Specific Gravity, Urine 1.018 1.005 - 1.030   pH 5.0 5.0 - 8.0   Glucose, UA >=500 (A) NEGATIVE mg/dL   Hgb urine dipstick SMALL (A) NEGATIVE   Bilirubin Urine NEGATIVE NEGATIVE   Ketones, ur NEGATIVE NEGATIVE mg/dL   Protein, ur NEGATIVE NEGATIVE mg/dL   Nitrite POSITIVE (A) NEGATIVE   Leukocytes,Ua NEGATIVE NEGATIVE   RBC / HPF 0-5 0 - 5 RBC/hpf   WBC, UA 0-5 0 - 5 WBC/hpf   Bacteria, UA RARE (A) NONE SEEN   Squamous Epithelial / LPF 0-5 0 - 5  Glucose, capillary  Result Value Ref Range   Glucose-Capillary 140 (H) 70 - 99 mg/dL  Glucose, capillary  Result Value Ref Range   Glucose-Capillary 123 (H) 70 - 99 mg/dL  Glucose, capillary  Result Value Ref Range   Glucose-Capillary 121 (H) 70 - 99 mg/dL  Glucose, capillary  Result Value Ref Range   Glucose-Capillary 113 (H) 70 - 99 mg/dL  Glucose, capillary  Result Value Ref Range    Glucose-Capillary 152 (H) 70 - 99 mg/dL  Glucose, capillary  Result Value Ref Range   Glucose-Capillary  136 (H) 70 - 99 mg/dL  Glucose, capillary  Result Value Ref Range   Glucose-Capillary 108 (H) 70 - 99 mg/dL  Glucose, capillary  Result Value Ref Range   Glucose-Capillary 168 (H) 70 - 99 mg/dL  Glucose, capillary  Result Value Ref Range   Glucose-Capillary 147 (H) 70 - 99 mg/dL  Glucose, capillary  Result Value Ref Range   Glucose-Capillary 159 (H) 70 - 99 mg/dL  Glucose, capillary  Result Value Ref Range   Glucose-Capillary 167 (H) 70 - 99 mg/dL  Synovial cell count + diff, w/ crystals  Result Value Ref Range   Color, Synovial YELLOW YELLOW   Appearance-Synovial CLOUDY (A) CLEAR   Crystals, Fluid INTRACELLULAR MONOSODIUM URATE CRYSTALS    WBC, Synovial 9,660 (H) 0 - 200 /cu mm   Neutrophil, Synovial 89 (H) 0 - 25 %   Lymphocytes-Synovial Fld 4 0 - 20 %   Monocyte-Macrophage-Synovial Fluid 7 (L) 50 - 90 %   Eosinophils-Synovial 0 0 - 1 %  Glucose, capillary  Result Value Ref Range   Glucose-Capillary 117 (H) 70 - 99 mg/dL  Glucose, capillary  Result Value Ref Range   Glucose-Capillary 116 (H) 70 - 99 mg/dL  Glucose, capillary  Result Value Ref Range   Glucose-Capillary 134 (H) 70 - 99 mg/dL  Glucose, capillary  Result Value Ref Range   Glucose-Capillary 122 (H) 70 - 99 mg/dL  Glucose, capillary  Result Value Ref Range   Glucose-Capillary 148 (H) 70 - 99 mg/dL  Glucose, capillary  Result Value Ref Range   Glucose-Capillary 123 (H) 70 - 99 mg/dL  Glucose, capillary  Result Value Ref Range   Glucose-Capillary 134 (H) 70 - 99 mg/dL  Glucose, capillary  Result Value Ref Range   Glucose-Capillary 126 (H) 70 - 99 mg/dL  Glucose, capillary  Result Value Ref Range   Glucose-Capillary 141 (H) 70 - 99 mg/dL  Glucose, capillary  Result Value Ref Range   Glucose-Capillary 130 (H) 70 - 99 mg/dL   Comment 1 Notify RN    Comment 2 Document in Chart     Discharge  Medications:   Allergies as of 08/26/2022       Reactions   Aspirin Other (See Comments)   Pt unsure   Clorazepate Dipotassium Other (See Comments)   "Can't function"   Ketoprofen Other (See Comments)   Pt unsure   Oxycodone-aspirin    REACTION: tongue swells        Medication List     STOP taking these medications    Farxiga 10 MG Tabs tablet Generic drug: dapagliflozin propanediol       TAKE these medications    Accu-Chek Aviva Plus test strip Generic drug: glucose blood   acetaminophen 650 MG CR tablet Commonly known as: TYLENOL Take 650 mg by mouth every 8 (eight) hours as needed for pain.   albuterol 108 (90 Base) MCG/ACT inhaler Commonly known as: VENTOLIN HFA Inhale 1-2 puffs into the lungs every 6 (six) hours as needed for wheezing or shortness of breath.   atorvastatin 80 MG tablet Commonly known as: LIPITOR Take 80 mg by mouth daily.   Basaglar KwikPen 100 UNIT/ML Inject 28 Units into the skin daily as needed (blood glucose greater than 120).   benzonatate 100 MG capsule Commonly known as: TESSALON Take 1 capsule (100 mg total) by mouth every 8 (eight) hours.   docusate sodium 100 MG capsule Commonly known as: COLACE Take 1 capsule (100 mg total) by mouth 2 (  two) times daily.   escitalopram 20 MG tablet Commonly known as: LEXAPRO Take 20 mg by mouth daily as needed (anxiety).   estradiol 0.1 MG/GM vaginal cream Commonly known as: ESTRACE Place 1 Applicatorful vaginally 2 (two) times a week.   HYDROmorphone 2 MG tablet Commonly known as: Dilaudid Take 0.5 tablets (1 mg total) by mouth every 8 (eight) hours as needed for up to 5 days for severe pain (HOLD IF BP <449 SYSTOLIC).   methocarbamol 500 MG tablet Commonly known as: ROBAXIN Take 1 tablet (500 mg total) by mouth every 8 (eight) hours as needed for muscle spasms.   Myrbetriq 25 MG Tb24 tablet Generic drug: mirabegron ER Take 25 mg by mouth daily.   OneTouch Delica Plus  [id number] Misc   rivaroxaban 10 MG Tabs tablet Commonly known as: XARELTO Take 1 tablet (10 mg total) by mouth daily. Start taking on: August 27, 2022   Toviaz 4 MG Tb24 tablet Generic drug: fesoterodine Take 4 mg by mouth daily.   True Metrix Meter w/Device Kit   valsartan 80 MG tablet Commonly known as: DIOVAN Take 80 mg by mouth daily.   Vitamin D (Ergocalciferol) 1.25 MG (50000 UNIT) Caps capsule Commonly known as: DRISDOL Take 50,000 Units by mouth every 7 (seven) days.        Diagnostic Studies: VAS [place] LOWER EXTREMITY VENOUS (DVT)  Result Date: 08/24/2022  Lower Venous DVT Study Patient Name:  Mckenzie Rogers  Date of Exam:   08/24/2022 Medical Rec #: [id number]           Accession #:    [id number] Date of Birth: Nov 16, 1940           Patient Gender: F Patient Age:   21 years Exam Location:  [facility] Procedure:      VAS [place] LOWER EXTREMITY VENOUS (DVT) Referring Phys: Gloriann Loan --------------------------------------------------------------------------------  Indications: Swelling.  Risk Factors: Surgery Total left knee replacement 08/19/22. Performing Technologist: Oda Cogan RDMS, RVT  Examination Guidelines: A complete evaluation includes B-mode imaging, spectral Doppler, color Doppler, and power Doppler as needed of all accessible portions of each vessel. Bilateral testing is considered an integral part of a complete examination. Limited examinations for reoccurring indications may be performed as noted. The reflux portion of the exam is performed with the patient in reverse Trendelenburg.  +---------+---------------+---------+-----------+----------+--------------+ RIGHT    CompressibilityPhasicitySpontaneityPropertiesThrombus Aging +---------+---------------+---------+-----------+----------+--------------+ CFV      Full           Yes      Yes                                  +---------+---------------+---------+-----------+----------+--------------+ SFJ      Full                                                        +---------+---------------+---------+-----------+----------+--------------+ FV Prox  Full                                                        +---------+---------------+---------+-----------+----------+--------------+ FV Mid   Full                                                        +---------+---------------+---------+-----------+----------+--------------+  FV DistalFull                                                        +---------+---------------+---------+-----------+----------+--------------+ PFV      Full                                                        +---------+---------------+---------+-----------+----------+--------------+ POP      Full           Yes      Yes                                 +---------+---------------+---------+-----------+----------+--------------+ PTV      Full                                                        +---------+---------------+---------+-----------+----------+--------------+ PERO     Full                                                        +---------+---------------+---------+-----------+----------+--------------+   +---------+---------------+---------+-----------+----------+--------------+ LEFT     CompressibilityPhasicitySpontaneityPropertiesThrombus Aging +---------+---------------+---------+-----------+----------+--------------+ CFV      Full           Yes      Yes                                 +---------+---------------+---------+-----------+----------+--------------+ SFJ      Full                                                        +---------+---------------+---------+-----------+----------+--------------+ FV Prox  Full                                                         +---------+---------------+---------+-----------+----------+--------------+ FV Mid   Full                                                        +---------+---------------+---------+-----------+----------+--------------+ FV DistalFull                                                        +---------+---------------+---------+-----------+----------+--------------+   PFV      Full                                                        +---------+---------------+---------+-----------+----------+--------------+ POP      Full           Yes      Yes                                 +---------+---------------+---------+-----------+----------+--------------+ PTV      Full                                                        +---------+---------------+---------+-----------+----------+--------------+ PERO     Full                                                        +---------+---------------+---------+-----------+----------+--------------+     Summary: BILATERAL: - No evidence of deep vein thrombosis seen in the lower extremities, bilaterally. -No evidence of popliteal cyst, bilaterally.   *See table(s) above for measurements and observations. Electronically signed by Harold Barban MD on 08/24/2022 at 11:21:10 PM.    Final     Disposition: Discharge disposition: 03-Skilled [place]       Discharge Instructions     Call MD / Call 911   Complete by: As directed    If you experience chest pain or shortness of breath, CALL 911 and be transported to the hospital emergency room.  If you develope a fever above 101 F, pus (white drainage) or increased drainage or redness at the wound, or calf pain, call your surgeon's office.   Constipation Prevention   Complete by: As directed    Drink plenty of fluids.  Prune juice may be helpful.  You may use a stool softener, such as Colace (over the counter) 100 mg twice a day.  Use MiraLax (over the counter) for constipation as  needed.   Diet - low sodium heart healthy   Complete by: As directed    Discharge instructions   Complete by: As directed    You may shower, dressing is waterproof.  Do not remove the dressing, we will remove it at your first post-op appointment.  Do not take a bath or soak the knee in a tub or pool.  You may weightbear as you can tolerate on the operative leg with a walker.  Continue using the CPM machine 3 times per day for one hour each time, increasing the degrees of range of motion daily.  Use the blue cradle boot under your heel to work on getting your leg straight.  Do NOT put a pillow under your knee.  You will follow-up with Dr. Marlou Sa in the clinic in 2 weeks at your given appointment date.    INSTRUCTIONS AFTER JOINT REPLACEMENT   Remove items at home which could result in a fall. This includes throw rugs or furniture in walking  pathways ICE to the affected joint every three hours while awake for 30 minutes at a time, for at least the first 3-5 days, and then as needed for pain and swelling.  Continue to use ice for pain and swelling. You may notice swelling that will progress down to the foot and ankle.  This is normal after surgery.  Elevate your leg when you are not up walking on it.   Continue to use the breathing machine you got in the hospital (incentive spirometer) which will help keep your temperature down.  It is common for your temperature to cycle up and down following surgery, especially at night when you are not up moving around and exerting yourself.  The breathing machine keeps your lungs expanded and your temperature down.   DIET:  As you were doing prior to hospitalization, we recommend a well-balanced diet.  DRESSING / WOUND CARE / SHOWERING  Keep the surgical dressing until follow up.  The dressing is water proof, so you can shower without any extra covering.  IF THE DRESSING FALLS OFF or the wound gets wet inside, change the dressing with sterile gauze.  Please use good  hand washing techniques before changing the dressing.  Do not use any lotions or creams on the incision until instructed by your surgeon.    ACTIVITY  Increase activity slowly as tolerated, but follow the weight bearing instructions below.   No driving for 6 weeks or until further direction given by your physician.  You cannot drive while taking narcotics.  No lifting or carrying greater than 10 lbs. until further directed by your surgeon. Avoid periods of inactivity such as sitting longer than an hour when not asleep. This helps prevent blood clots.  You may return to work once you are authorized by your doctor.     WEIGHT BEARING   Weight bearing as tolerated with assist device (walker, cane, etc) as directed, use it as long as suggested by your surgeon or therapist, typically at least 4-6 weeks.   EXERCISES  Results after joint replacement surgery are often greatly improved when you follow the exercise, range of motion and muscle strengthening exercises prescribed by your doctor. Safety measures are also important to protect the joint from further injury. Any time any of these exercises cause you to have increased pain or swelling, decrease what you are doing until you are comfortable again and then slowly increase them. If you have problems or questions, call your caregiver or physical therapist for advice.   Rehabilitation is important following a joint replacement. After just a few days of immobilization, the muscles of the leg can become weakened and shrink (atrophy).  These exercises are designed to build up the tone and strength of the thigh and leg muscles and to improve motion. Often times heat used for twenty to thirty minutes before working out will loosen up your tissues and help with improving the range of motion but do not use heat for the first two weeks following surgery (sometimes heat can increase post-operative swelling).   These exercises can be done on a training  (exercise) mat, on the floor, on a table or on a bed. Use whatever works the best and is most comfortable for you.    Use music or television while you are exercising so that the exercises are a pleasant break in your day. This will make your life better with the exercises acting as a break in your routine that you can look forward to.  Perform all exercises about fifteen times, three times per day or as directed.  You should exercise both the operative leg and the other leg as well.  Exercises include:   Quad Sets - Tighten up the muscle on the front of the thigh (Quad) and hold for 5-10 seconds.   Straight Leg Raises - With your knee straight (if you were given a brace, keep it on), lift the leg to 60 degrees, hold for 3 seconds, and slowly lower the leg.  Perform this exercise against resistance later as your leg gets stronger.  Leg Slides: Lying on your back, slowly slide your foot toward your buttocks, bending your knee up off the floor (only go as far as is comfortable). Then slowly slide your foot back down until your leg is flat on the floor again.  Angel Wings: Lying on your back spread your legs to the side as far apart as you can without causing discomfort.  Hamstring Strength:  Lying on your back, push your heel against the floor with your leg straight by tightening up the muscles of your buttocks.  Repeat, but this time bend your knee to a comfortable angle, and push your heel against the floor.  You may put a pillow under the heel to make it more comfortable if necessary.   A rehabilitation program following joint replacement surgery can speed recovery and prevent re-injury in the future due to weakened muscles. Contact your doctor or a physical therapist for more information on knee rehabilitation.    CONSTIPATION  Constipation is defined medically as fewer than three stools per week and severe constipation as less than one stool per week.  Even if you have a regular bowel pattern at  home, your normal regimen is likely to be disrupted due to multiple reasons following surgery.  Combination of anesthesia, postoperative narcotics, change in appetite and fluid intake all can affect your bowels.   YOU MUST use at least one of the following options; they are listed in order of increasing strength to get the job done.  They are all available over the counter, and you may need to use some, POSSIBLY even all of these options:    Drink plenty of fluids (prune juice may be helpful) and high fiber foods Colace 100 mg by mouth twice a day  Senokot for constipation as directed and as needed Dulcolax (bisacodyl), take with full glass of water  Miralax (polyethylene glycol) once or twice a day as needed.  If you have tried all these things and are unable to have a bowel movement in the first 3-4 days after surgery call either your surgeon or your primary doctor.    If you experience loose stools or diarrhea, hold the medications until you stool forms back up.  If your symptoms do not get better within 1 week or if they get worse, check with your doctor.  If you experience "the worst abdominal pain ever" or develop nausea or vomiting, please contact the office immediately for further recommendations for treatment.   ITCHING:  If you experience itching with your medications, try taking only a single pain pill, or even half a pain pill at a time.  You can also use Benadryl over the counter for itching or also to help with sleep.   TED HOSE STOCKINGS:  Use stockings on both legs until for at least 2 weeks or as directed by physician office. They may be removed at night for sleeping.  MEDICATIONS:  See your  medication summary on the "After Visit Summary" that nursing will review with you.  You may have some home medications which will be placed on hold until you complete the course of blood thinner medication.  It is important for you to complete the blood thinner medication as  prescribed.  PRECAUTIONS:  If you experience chest pain or shortness of breath - call 911 immediately for transfer to the hospital emergency department.   If you develop a fever greater that 101 F, purulent drainage from wound, increased redness or drainage from wound, foul odor from the wound/dressing, or calf pain - CONTACT YOUR SURGEON.                                                   FOLLOW-UP APPOINTMENTS:  If you do not already have a post-op appointment, please call the office for an appointment to be seen by your surgeon.  Guidelines for how soon to be seen are listed in your "After Visit Summary", but are typically between 1-4 weeks after surgery.  OTHER INSTRUCTIONS:   Knee Replacement:  Do not place pillow under knee, focus on keeping the knee straight while resting. CPM instructions: 0-90 degrees, 2 hours in the morning, 2 hours in the afternoon, and 2 hours in the evening. Place foam block, curve side up under heel at all times except when in CPM or when walking.  DO NOT modify, tear, cut, or change the foam block in any way.  POST-OPERATIVE OPIOID TAPER INSTRUCTIONS: It is important to wean off of your opioid medication as soon as possible. If you do not need pain medication after your surgery it is ok to stop day one. Opioids include: Codeine, Hydrocodone(Norco, Vicodin), Oxycodone(Percocet, oxycontin) and hydromorphone amongst others.  Long term and even short term use of opiods can cause: Increased pain response Dependence Constipation Depression Respiratory depression And more.  Withdrawal symptoms can include Flu like symptoms Nausea, vomiting And more Techniques to manage these symptoms Hydrate well Eat regular healthy meals Stay active Use relaxation techniques(deep breathing, meditating, yoga) Do Not substitute Alcohol to help with tapering If you have been on opioids for less than two weeks and do not have pain than it is ok to stop all together.  Plan to  wean off of opioids This plan should start within one week post op of your joint replacement. Maintain the same interval or time between taking each dose and first decrease the dose.  Cut the total daily intake of opioids by one tablet each day Next start to increase the time between doses. The last dose that should be eliminated is the evening dose.   MAKE SURE YOU:  Understand these instructions.  Get help right away if you are not doing well or get worse.    Thank you for letting us be a part of your medical care team.  It is a privilege we respect greatly.  We hope these instructions will help you stay on track for a fast and full recovery!    Dental Antibiotics:  In most cases prophylactic antibiotics for Dental procdeures after total joint surgery are not necessary.  Exceptions are as follows:  1. History of prior total joint infection  2. Severely immunocompromised (Organ Transplant, cancer chemotherapy, Rheumatoid biologic meds such as Gerald)  3. Poorly controlled diabetes (A1C &gt; 8.0, blood glucose  over 200)  If you have one of these conditions, contact your surgeon for an antibiotic prescription, prior to your dental procedure.   Increase activity slowly as tolerated   Complete by: As directed    Post-operative opioid taper instructions:   Complete by: As directed    POST-OPERATIVE OPIOID TAPER INSTRUCTIONS: It is important to wean off of your opioid medication as soon as possible. If you do not need pain medication after your surgery it is ok to stop day one. Opioids include: Codeine, Hydrocodone(Norco, Vicodin), Oxycodone(Percocet, oxycontin) and hydromorphone amongst others.  Long term and even short term use of opiods can cause: Increased pain response Dependence Constipation Depression Respiratory depression And more.  Withdrawal symptoms can include Flu like symptoms Nausea, vomiting And more Techniques to manage these symptoms Hydrate well Eat  regular healthy meals Stay active Use relaxation techniques(deep breathing, meditating, yoga) Do Not substitute Alcohol to help with tapering If you have been on opioids for less than two weeks and do not have pain than it is ok to stop all together.  Plan to wean off of opioids This plan should start within one week post op of your joint replacement. Maintain the same interval or time between taking each dose and first decrease the dose.  Cut the total daily intake of opioids by one tablet each day Next start to increase the time between doses. The last dose that should be eliminated is the evening dose.             SignedAnnie Main 08/26/2022, 12:30 PM

## 2022-08-26 NOTE — Progress Notes (Signed)
Physical Therapy Treatment Patient Details Name: Mckenzie Rogers MRN: [id number] DOB: 07/20/1941 Today's Date: 08/26/2022   History of Present Illness 81 yo female with onset of failed conservative OA management had TKA on LLE 12/7 with WBAT and request to use immob. PMHx: OA (b knees), DM, previously worked as Quarry manager.    PT Comments    Pt was seen for mobility on side of bed and noted her L knee pain from holding pain meds was significant.  Pt has been quite hypotensive, and requires alternate strategies for pain management.  Ice was replaced on the knee with brace loosely placed to manage discomfort.  Follow along with patient for symptoms and progression to walk as her pain permits.  Follow up still recommended to be SNF for length of time to get ROM and strength back to L knee and begin to really do standing and walking.   Recommendations for follow up therapy are one component of a multi-disciplinary discharge planning process, led by the attending physician.  Recommendations may be updated based on patient status, additional functional criteria and insurance authorization.  Follow Up Recommendations  Follow physician's recommendations for discharge plan and follow up therapies     Assistance Recommended at Discharge Frequent or constant Supervision/Assistance  Patient can return home with the following Assistance with cooking/housework;Direct supervision/assist for medications management;Direct supervision/assist for financial management;Assist for transportation;Help with stairs or ramp for entrance;A lot of help with walking and/or transfers;A lot of help with bathing/dressing/bathroom   Equipment Recommendations  Rolling walker (2 wheels);BSC/3in1    Recommendations for Other Services OT consult     Precautions / Restrictions Precautions Precautions: Fall;Knee Precaution Booklet Issued: Yes (comment) Precaution Comments: Contact; wear immobilizer OOB Required Braces or  Orthoses: Knee Immobilizer - Left Knee Immobilizer - Left: On when out of bed or walking Restrictions Weight Bearing Restrictions: No LLE Weight Bearing: Weight bearing as tolerated Other Position/Activity Restrictions: with KI donned     Mobility  Bed Mobility Overal bed mobility: Needs Assistance Bed Mobility: Sit to Supine       Sit to supine: Mod assist   General bed mobility comments: greater support to legs and repositioning as her BP has dropped and she cannot have pain meds    Transfers Overall transfer level: Needs assistance                Lateral/Scoot Transfers: Total assist, Max assist      Ambulation/Gait               General Gait Details: declined over L knee pain with oral meds being restricted due to hypotension   Stairs             Wheelchair Mobility    Modified Rankin (Stroke Patients Only)       Balance Overall balance assessment: Needs assistance Sitting-balance support: Feet supported Sitting balance-Leahy Scale: Good Sitting balance - Comments: side of bed for OT and then follow up with PT Postural control: Right lateral lean Standing balance support:  (declined standing)                                Cognition Arousal/Alertness: Awake/alert Behavior During Therapy: WFL for tasks assessed/performed Overall Cognitive Status: Within Functional Limits for tasks assessed  General Comments: devastated by son's death        Exercises Total Joint Exercises Knee Flexion: AAROM, 5 reps    General Comments General comments (skin integrity, edema, etc.): Pt is limited by meds being withheld for BP concerns.  Replaced ice on her L knee      Pertinent Vitals/Pain Pain Assessment Pain Assessment: Faces Faces Pain Scale: Hurts whole lot Pain Location: Left knee with mobilty Pain Descriptors / Indicators: Operative site guarding, Grimacing Pain  Intervention(s): Limited activity within patient's tolerance, Monitored during session, Premedicated before session, Repositioned, Ice applied    Home Living                          Prior Function            PT Goals (current goals can now be found in the care plan section) Progress towards PT goals: Not progressing toward goals - comment    Frequency    7X/week      PT Plan Current plan remains appropriate    Co-evaluation              AM-PAC PT "6 Clicks" Mobility   Outcome Measure  Help needed turning from your back to your side while in a flat bed without using bedrails?: A Little Help needed moving from lying on your back to sitting on the side of a flat bed without using bedrails?: A Lot Help needed moving to and from a bed to a chair (including a wheelchair)?: A Lot Help needed standing up from a chair using your arms (e.g., wheelchair or bedside chair)?: A Lot Help needed to walk in hospital room?: A Lot Help needed climbing 3-5 steps with a railing? : A Lot 6 Click Score: 13    End of Session Equipment Utilized During Treatment: Left knee immobilizer Activity Tolerance: Patient limited by pain;Other (comment) (very sad over her son's passing last night) Patient left: in bed;with call bell/phone within reach;with family/visitor present Nurse Communication: Mobility status PT Visit Diagnosis: Unsteadiness on feet (R26.81);Muscle weakness (generalized) (M62.81);Difficulty in walking, not elsewhere classified (R26.2);Pain Pain - Right/Left: Left Pain - part of body: Knee     Time: 6734-1937 PT Time Calculation (min) (ACUTE ONLY): 22 min  Charges:  $Therapeutic Activity: 8-22 mins   Ivar Drape 08/26/2022, 3:18 PM  Samul Dada, PT PhD Acute Rehab Dept. Number: [facility] [id number] and [facility] [phone]

## 2022-08-26 NOTE — Progress Notes (Signed)
Occupational Therapy Treatment Patient Details Name: Mckenzie Rogers MRN: [id number] DOB: 23-Feb-1941 Today's Date: 08/26/2022   History of present illness 81 yo female with onset of failed conservative OA management had TKA on LLE 12/7 with WBAT and request to use immob. PMHx: OA (b knees), DM, previously worked as Lawyer.   OT comments  Pt in bed upon therapy arrival with Daughter present in room. Pt agreed to participate in OT treatment session focusing on establishing BUE strengthening HEP using red resistive band. Pt was able to transition to sitting on EOB with some physical assistance to guide LLE to edge. Pt reports that her BP has been too low to allow her to have any pain medication. Pt was provided with verbal instructions and visual demonstration with handout for UE HEP. Pt verbalized and demonstrated understanding. Pt handoff to PT at end of session.   Recommendations for follow up therapy are one component of a multi-disciplinary discharge planning process, led by the attending physician.  Recommendations may be updated based on patient status, additional functional criteria and insurance authorization.    Follow Up Recommendations  Skilled nursing-short term rehab (<3 hours/day)     Assistance Recommended at Discharge Intermittent Supervision/Assistance  Patient can return home with the following  Assistance with cooking/housework;Assist for transportation;Help with stairs or ramp for entrance;A little help with bathing/dressing/bathroom;A little help with walking and/or transfers   Equipment Recommendations  Other (comment) (defer to next venue of care)       Precautions / Restrictions Precautions Precautions: Fall;Knee Precaution Comments: Contact; wear immobilizer OOB Required Braces or Orthoses: Knee Immobilizer - Left Knee Immobilizer - Left: On when out of bed or walking Restrictions Weight Bearing Restrictions: No LLE Weight Bearing: Weight bearing as  tolerated Other Position/Activity Restrictions: with KI donned       Mobility Bed Mobility Overal bed mobility: Needs Assistance Bed Mobility: Supine to Sit     Supine to sit: Min assist, HOB elevated     General bed mobility comments: Provided assist to LLE when sliding extremity towards EOB. With slipper on, pt required assist to off load heel in order to slide off bed.         Balance Overall balance assessment: Needs assistance Sitting-balance support: Feet supported, No upper extremity supported Sitting balance-Leahy Scale: Good Sitting balance - Comments: Seated EOB to complete UE HEP           ADL either performed or assessed with clinical judgement      Cognition Arousal/Alertness: Awake/alert Behavior During Therapy: WFL for tasks assessed/performed Overall Cognitive Status: Within Functional Limits for tasks assessed          Exercises General Exercises - Upper Extremity Shoulder Flexion: Strengthening, Both, 10 reps, Seated, Theraband Theraband Level (Shoulder Flexion): Level 2 (Red) Shoulder ADduction: Strengthening, Both, 10 reps, Seated, Theraband Theraband Level (Shoulder Adduction): Level 2 (Red) Shoulder Horizontal ABduction: Strengthening, Both, 10 reps, Seated, Theraband Theraband Level (Shoulder Horizontal Abduction): Level 2 (Red) Shoulder Horizontal ADduction: Strengthening, Both, 10 reps, Seated, Theraband Theraband Level (Shoulder Horizontal Adduction): Level 2 (Red) Shoulder Exercises Shoulder External Rotation: Strengthening, Both, 10 reps, Seated, Theraband Theraband Level (Shoulder External Rotation): Level 2 (Red)       General Comments BP while supine in bed: 102/48    Pertinent Vitals/ Pain       Pain Assessment Pain Assessment: 0-10 Pain Score: 5  Pain Location: Left knee with mobilty Pain Descriptors / Indicators: Operative site guarding, Grimacing, Discomfort Pain Intervention(s): Monitored during  session, Ice  applied         Frequency  Min 2X/week        Progress Toward Goals  OT Goals(current goals can now be found in the care plan section)  Progress towards OT goals: Progressing toward goals     Plan Discharge plan remains appropriate;Frequency remains appropriate       AM-PAC OT "6 Clicks" Daily Activity     Outcome Measure   Help from another person eating meals?: None Help from another person taking care of personal grooming?: None Help from another person toileting, which includes using toliet, bedpan, or urinal?: A Little Help from another person bathing (including washing, rinsing, drying)?: A Little Help from another person to put on and taking off regular upper body clothing?: A Little Help from another person to put on and taking off regular lower body clothing?: A Lot 6 Click Score: 19    End of Session CPM Left Knee CPM Left Knee: Off  OT Visit Diagnosis: Unsteadiness on feet (R26.81);Pain;Muscle weakness (generalized) (M62.81) Pain - Right/Left: Left Pain - part of body: Leg;Knee   Activity Tolerance Patient tolerated treatment well;Patient limited by pain   Patient Left in bed;with family/visitor present;Other (comment) (handoff to PT)           Time: 1125-1200 OT Time Calculation (min): 35 min  Charges: OT General Charges $OT Visit: 1 Visit OT Treatments $Therapeutic Exercise: 23-37 mins  Limmie Patricia, OTR/L,CBIS  Supplemental OT - [facility] and [facility]   Marlowe Lawes, Charisse March 08/26/2022, 12:20 PM

## 2022-08-26 NOTE — Progress Notes (Signed)
  Subjective: Mckenzie Rogers is a 81 y.o. female s/p left TKA.  Pt's pain is controlled but moderate.  Pt denies any complain of chest pain, shortness of breath, abdominal pain, calf pain.  Patient denies any fevers or chills. Doing well from the standpoint of her operative knee. Right knee much better after episode of gout flare.  Patient is very saddened by her son's admission to hospital and decision has been made to withdraw life support  Objective: Vital signs in last 24 hours: Temp:  [97.5 F (36.4 C)] 97.5 F (36.4 C) (12/13 2049) Pulse Rate:  [78] 78 (12/13 2049) Resp:  [16] 16 (12/13 2049) BP: (86-96)/(42-62) 93/54 (12/13 2049) SpO2:  [98 %] 98 % (12/13 2049)  Intake/Output from previous day: 12/13 0701 - 12/14 0700 In: 358 [P.O.:358] Out: -  Intake/Output this shift: No intake/output data recorded.  Exam:  No gross blood or drainage overlying the dressing 2+ DP pulse Sensation intact distally in the operative foot Able to dorsiflex and plantarflex the operative foot No calf tenderness.  Negative Homans' sign. Able to perform straight leg raise weakly   Labs: No results for input(s): "HGB" in the last 72 hours. No results for input(s): "WBC", "RBC", "HCT", "PLT" in the last 72 hours. No results for input(s): "NA", "K", "CL", "CO2", "BUN", "CREATININE", "GLUCOSE", "CALCIUM" in the last 72 hours. No results for input(s): "LABPT", "INR" in the last 72 hours.  Assessment/Plan: Pt is POD6 s/p TKA.    -Plan to discharge to home tomorrow to SNF.  Patient's son having life support withdrawn in hospital today so plan to keep her here today until tomorrow  -WBAT with a walker  -Follow-up with Dr. August Saucer in clinic 2 weeks postoperatively    [facility] 08/26/2022, 7:22 AM

## 2022-08-26 NOTE — Progress Notes (Signed)
Patient stable.  Pain is overall controlled though she has increased pain with flexion.  No fevers or chills.  No chest pain or shortness of breath.  Blood pressure has been on the lower side so holding pain medication.  Plan to discharge to skilled nursing facility today.  Continue Xarelto for DVT prophylaxis.  Follow-up with our office in 1 week for clinical recheck and radiographic evaluation.

## 2022-08-26 NOTE — Patient Instructions (Signed)
1) Strengthening: Chest Pull - Resisted   Hold Theraband in front of body with hands about shoulder width a part. Pull band a part and back together slowly. Repeat __10-12__ times. Complete ___1_ set(s) per session.. Repeat __1__ session(s) per day.  http://orth.exer.us/926   Copyright  VHI. All rights reserved.   2) PNF Strengthening: Resisted   Standing with resistive band around each hand, bring right arm up and away, thumb back. Repeat _10-12___ times per set. Do __1__ sets per session. Do ___1_ sessions per day.        3) Resisted External Rotation: in Neutral - Bilateral   Sit or stand, tubing in both hands, elbows at sides, bent to 90, forearms forward. Pinch shoulder blades together and rotate forearms out. Keep elbows at sides. Repeat ___10-12_ times per set. Do __1__ sets per session. Do _1___ sessions per day.  http://orth.exer.us/966   Copyright  VHI. All rights reserved.   4) PNF Strengthening: Resisted   Standing, hold resistive band above head. Bring right arm down and out from side. Repeat __10-12__ times per set. Do __1__ sets per session. Do _1___ sessions per day.  http://orth.exer.us/922   Copyright  VHI. All rights reserved.   

## 2022-08-26 NOTE — TOC Transition Note (Signed)
Transition of Care [facility]) - CM/SW Discharge Note   Patient Details  Name: Mckenzie Rogers MRN: [id number] Date of Birth: [birth year]  [facility] CM/SW Contact:  Lorri Frederick, LCSW Phone Number: 08/26/2022, 1:06 PM   Clinical Narrative:  Pt discharging to [facility].  RN call report to [phone].   1100: CSW confirmed with [facility] that they can receive pt today.  No transportation recommendation on PT notes, CSW spoke with Ruth/PT who reviewed pt, does recommend ambulance transport.    Final next level of care: Skilled Nursing Facility Barriers to Discharge: Barriers Resolved   Patient Goals and CMS Choice Patient states their goals for this hospitalization and ongoing recovery are:: do things for myself   Choice offered to / list presented to : Patient (pt reports she has bed at [facility] in place)  Discharge Placement              Patient chooses bed at:  ([facility]) Patient to be transferred to facility by: [facility] Name of family member notified: son Vaughan Basta in room Patient and family notified of of transfer: 08/26/22  Discharge Plan and Services In-house Referral: Clinical Social Work   Post Acute Care Choice: Skilled Nursing Facility                               Social Determinants of Health (SDOH) Interventions     Readmission Risk Interventions     No data to display

## 2022-08-27 ENCOUNTER — Telehealth: Payer: Self-pay | Admitting: *Deleted

## 2022-08-27 DIAGNOSIS — I10 Essential (primary) hypertension: Secondary | ICD-10-CM | POA: Diagnosis not present

## 2022-08-27 DIAGNOSIS — E119 Type 2 diabetes mellitus without complications: Secondary | ICD-10-CM | POA: Diagnosis not present

## 2022-08-27 DIAGNOSIS — E785 Hyperlipidemia, unspecified: Secondary | ICD-10-CM | POA: Diagnosis not present

## 2022-08-27 DIAGNOSIS — Z96652 Presence of left artificial knee joint: Secondary | ICD-10-CM | POA: Diagnosis not present

## 2022-08-27 LAB — BODY FLUID CULTURE W GRAM STAIN: Culture: NO GROWTH

## 2022-08-27 NOTE — Telephone Encounter (Signed)
Ortho bundle D/C and 7 day call combined due to delayed discharged from hospital after surgery.

## 2022-09-01 ENCOUNTER — Ambulatory Visit (INDEPENDENT_AMBULATORY_CARE_PROVIDER_SITE_OTHER): Payer: Medicare HMO | Admitting: Surgical

## 2022-09-01 ENCOUNTER — Ambulatory Visit (INDEPENDENT_AMBULATORY_CARE_PROVIDER_SITE_OTHER): Payer: Medicare HMO

## 2022-09-01 ENCOUNTER — Encounter: Payer: Self-pay | Admitting: Orthopedic Surgery

## 2022-09-01 DIAGNOSIS — Z96652 Presence of left artificial knee joint: Secondary | ICD-10-CM

## 2022-09-01 NOTE — Addendum Note (Signed)
Addended byPrescott Parma on: 09/01/2022 01:34 PM   Modules accepted: Orders

## 2022-09-01 NOTE — Progress Notes (Signed)
Post-Op Visit Note   Patient: Mckenzie Rogers           Date of Birth: 1940/10/25           MRN: [id number] Visit Date: 09/01/2022 PCP: Gaspar Garbe, MD   Assessment & Plan:  Chief Complaint:  Chief Complaint  Patient presents with   Left Knee - Routine Post Op    L TKA (surgery date 08-19-22)   Visit Diagnoses:  1. Status post total left knee replacement     Plan: Mckenzie Rogers is a 81 y.o. female who presents s/p left total knee arthroplasty on 08/19/2022.  Doing well overall.  Using CPM machine at a rehab center.  Currently staying at [facility]..  They deny any calf pain, shortness of breath, chest pain, abdominal pain.  Pain is overall controlled and taking Dilaudid for pain control.  Taking Xarelto for DVT prophylaxis with her history of previous DVT during pregnancy.  Ambulating with walker and she is working with physical therapy pretty much every day at the SNF.  They have had a little bit of difficulty with her blood pressure dropping on occasion but she does not really report any dizziness with ambulation..   On exam, patient has range of motion 10 degrees extension and 70 degrees of knee flexion.  Incision is healing well without evidence of infection or dehiscence.  2+ DP pulse of the operative extremity.  No calf tenderness, negative Homans' sign.  Able to perform straight leg raise with about 15 degree extensor lag.  Intact ankle dorsiflexion.  Plan is continue with physical therapy at the rehab center.  Need to really focus on achieving full knee extension which was demonstrated for the patient how to do this today.  Also want to work on flexing her knee and getting her quad strong as possible so that she can get rid of the knee immobilizer.  For now, with her extensor lag, she should continue to use knee immobilizer when she is ambulatory but she may discontinue it when she is not.  She will have ultrasound to rule out DVT and if that is negative,  she may transition from Xarelto to aspirin.  Plan to have her follow-up in 3 weeks for clinical recheck with Dr. August Saucer primarily to check her extension and flexion range of motion.  She will call our office when she is discharged from the SNF so that we may set up either outpatient physical therapy or home health physical therapy for her depending on her social situation and how much help she will have for driving from her house to a physical therapy office..    Follow-Up Instructions: No follow-ups on file.   Orders:  Orders Placed This Encounter  Procedures   XR Knee 1-2 Views Left   No orders of the defined types were placed in this encounter.   Imaging: No results found.  PMFS History: Patient Active Problem List   Diagnosis Date Noted   Arthritis of left knee 08/21/2022   OA (osteoarthritis) of knee 08/19/2022   S/P TKR (total knee replacement), left 08/19/2022   Controlled type 2 diabetes mellitus without complication, without long-term current use of insulin (HCC) 07/31/2021   Genetic testing 03/14/2020   Family history of pancreatic cancer    Family history of kidney cancer    Family history of lung cancer    Past Medical History:  Diagnosis Date   Chronic kidney disease    Complication of anesthesia    "  hard time waking me up after foot surgery"   Depression    Diabetes (HCC)    type 2   Edema    Family history of kidney cancer    Family history of lung cancer    Family history of pancreatic cancer    Osteoarthritis of both knees    Sleep apnea    "years ago" does not wear CPAP anymmore    Family History  Problem Relation Age of Onset   Diabetes Maternal Aunt    CVA Daughter    Kidney failure Son    Pancreatic cancer Father 22   Kidney cancer Half-Sister 25       non-smoker   Lung cancer Half-Sister 4       hx of smoking   Cancer Half-Sister 72       unknown primary    Past Surgical History:  Procedure Laterality Date   ABDOMINAL HYSTERECTOMY      FOOT SURGERY Left    TOTAL KNEE ARTHROPLASTY Left 08/19/2022   Procedure: LEFT TOTAL KNEE ARTHROPLASTY;  Surgeon: Cammy Copa, MD;  Location: [facility] OR;  Service: Orthopedics;  Laterality: Left;   TUBAL LIGATION     Social History   Occupational History   Not on file  Tobacco Use   Smoking status: Never   Smokeless tobacco: Never  Vaping Use   Vaping Use: Never used  Substance and Sexual Activity   Alcohol use: Never   Drug use: Never   Sexual activity: Not on file

## 2022-09-02 ENCOUNTER — Encounter: Payer: Medicare HMO | Admitting: Orthopedic Surgery

## 2022-09-03 ENCOUNTER — Telehealth: Payer: Self-pay | Admitting: *Deleted

## 2022-09-03 NOTE — Telephone Encounter (Signed)
09/01/22- Met with Patient in office during post op visit. Ortho bundle 14 day call completed.

## 2022-09-13 ENCOUNTER — Emergency Department (HOSPITAL_COMMUNITY): Payer: Medicare HMO

## 2022-09-13 ENCOUNTER — Other Ambulatory Visit: Payer: Self-pay

## 2022-09-13 ENCOUNTER — Emergency Department (HOSPITAL_BASED_OUTPATIENT_CLINIC_OR_DEPARTMENT_OTHER): Payer: Medicare HMO

## 2022-09-13 ENCOUNTER — Emergency Department (HOSPITAL_COMMUNITY)
Admission: EM | Admit: 2022-09-13 | Discharge: 2022-09-14 | Disposition: A | Discharge status: Skilled Nursing Facility | Payer: Medicare HMO | Attending: Emergency Medicine | Admitting: Emergency Medicine

## 2022-09-13 DIAGNOSIS — M25461 Effusion, right knee: Secondary | ICD-10-CM | POA: Diagnosis not present

## 2022-09-13 DIAGNOSIS — Z794 Long term (current) use of insulin: Secondary | ICD-10-CM | POA: Insufficient documentation

## 2022-09-13 DIAGNOSIS — R52 Pain, unspecified: Secondary | ICD-10-CM | POA: Diagnosis not present

## 2022-09-13 DIAGNOSIS — M25561 Pain in right knee: Secondary | ICD-10-CM | POA: Diagnosis not present

## 2022-09-13 DIAGNOSIS — E119 Type 2 diabetes mellitus without complications: Secondary | ICD-10-CM | POA: Insufficient documentation

## 2022-09-13 DIAGNOSIS — M79605 Pain in left leg: Secondary | ICD-10-CM | POA: Diagnosis not present

## 2022-09-13 DIAGNOSIS — Z7901 Long term (current) use of anticoagulants: Secondary | ICD-10-CM | POA: Diagnosis not present

## 2022-09-13 DIAGNOSIS — Z79899 Other long term (current) drug therapy: Secondary | ICD-10-CM | POA: Diagnosis not present

## 2022-09-13 DIAGNOSIS — I10 Essential (primary) hypertension: Secondary | ICD-10-CM | POA: Insufficient documentation

## 2022-09-13 DIAGNOSIS — M25569 Pain in unspecified knee: Secondary | ICD-10-CM | POA: Diagnosis not present

## 2022-09-13 DIAGNOSIS — Z96652 Presence of left artificial knee joint: Secondary | ICD-10-CM | POA: Insufficient documentation

## 2022-09-13 LAB — COMPREHENSIVE METABOLIC PANEL
ALT: 25 U/L (ref 0–44)
AST: 38 U/L (ref 15–41)
Albumin: 3.1 g/dL — ABNORMAL LOW (ref 3.5–5.0)
Alkaline Phosphatase: 102 U/L (ref 38–126)
Anion gap: 10 (ref 5–15)
BUN: 15 mg/dL (ref 8–23)
CO2: 24 mmol/L (ref 22–32)
Calcium: 9.3 mg/dL (ref 8.9–10.3)
Chloride: 104 mmol/L (ref 98–111)
Creatinine, Ser: 0.99 mg/dL (ref 0.44–1.00)
GFR, Estimated: 58 mL/min — ABNORMAL LOW (ref >60)
Glucose, Bld: 113 mg/dL — ABNORMAL HIGH (ref 70–99)
Potassium: 3.8 mmol/L (ref 3.5–5.1)
Sodium: 138 mmol/L (ref 135–145)
Total Bilirubin: 0.6 mg/dL (ref 0.3–1.2)
Total Protein: 7.8 g/dL (ref 6.5–8.1)

## 2022-09-13 LAB — CBC WITH DIFFERENTIAL/PLATELET
Abs Immature Granulocytes: 0.01 10*3/uL (ref 0.00–0.07)
Basophils Absolute: 0.1 10*3/uL (ref 0.0–0.1)
Basophils Relative: 1 %
Eosinophils Absolute: 0.4 10*3/uL (ref 0.0–0.5)
Eosinophils Relative: 8 %
HCT: 36 % (ref 36.0–46.0)
Hemoglobin: 11.4 g/dL — ABNORMAL LOW (ref 12.0–15.0)
Immature Granulocytes: 0 %
Lymphocytes Relative: 55 %
Lymphs Abs: 2.6 10*3/uL (ref 0.7–4.0)
MCH: 30.1 pg (ref 26.0–34.0)
MCHC: 31.7 g/dL (ref 30.0–36.0)
MCV: 95 fL (ref 80.0–100.0)
Monocytes Absolute: 0.5 10*3/uL (ref 0.1–1.0)
Monocytes Relative: 11 %
Neutro Abs: 1.2 10*3/uL — ABNORMAL LOW (ref 1.7–7.7)
Neutrophils Relative %: 25 %
Platelets: 264 10*3/uL (ref 150–400)
RBC: 3.79 MIL/uL — ABNORMAL LOW (ref 3.87–5.11)
RDW: 14.5 % (ref 11.5–15.5)
WBC: 4.8 10*3/uL (ref 4.0–10.5)
nRBC: 0 % (ref 0.0–0.2)

## 2022-09-13 LAB — URIC ACID: Uric Acid, Serum: 5.5 mg/dL (ref 2.5–7.1)

## 2022-09-13 MED ORDER — HYDROMORPHONE HCL 2 MG PO TABS: 1.0000 mg | ORAL_TABLET | ORAL | 0 refills | Status: DC | PRN

## 2022-09-13 MED ORDER — COLCHICINE 0.6 MG PO TABS: 0.6000 mg | ORAL_TABLET | Freq: Two times a day (BID) | ORAL | 0 refills | Status: DC

## 2022-09-13 MED ORDER — KETOROLAC TROMETHAMINE 60 MG/2ML IM SOLN
15.0000 mg | Freq: Once | INTRAMUSCULAR | Status: AC
  Administered 2022-09-13: 15 mg via INTRAMUSCULAR
  Filled 2022-09-13: qty 2

## 2022-09-13 MED ORDER — MORPHINE SULFATE (PF) 4 MG/ML IV SOLN
4.0000 mg | Freq: Once | INTRAVENOUS | Status: AC
  Administered 2022-09-13: 4 mg via INTRAVENOUS
  Filled 2022-09-13: qty 1

## 2022-09-13 MED ORDER — PREDNISONE 20 MG PO TABS: 20.0000 mg | ORAL_TABLET | Freq: Once | ORAL | Status: DC

## 2022-09-13 MED ORDER — SODIUM CHLORIDE 0.9 % IV BOLUS
500.0000 mL | Freq: Once | INTRAVENOUS | Status: AC
  Administered 2022-09-13: 500 mL via INTRAVENOUS

## 2022-09-13 NOTE — Progress Notes (Signed)
RLE venous duplex has been completed.  Preliminary results given to [facility], PA-C.   Results can be found under chart review under CV PROC. 09/13/2022 6:24 PM Mohanad Carsten RVT, RDMS

## 2022-09-13 NOTE — ED Provider Notes (Signed)
[facility]-EMERGENCY DEPT Provider Note   CSN: [id number] Arrival date & time: 09/13/22  1447     History  Chief Complaint  Patient presents with   Knee Pain    R knee pain r/t Gout     Mckenzie Rogers is a 82 y.o. female.  History of diabetes, hypertension, high cholesterol.  She is currently in rehab facility undergoing physical therapy status post left total knee replacement.  States her left knee has been doing well but for the past week she has been having pain to the right lower extremity from the knee to the ankle all over.  She states that has been getting progressively more severe with some mild swelling noted.  She states she has not been able to undergo physical therapy for her left knee due to the pain in her right leg so she presents to the ER today for further evaluation.  She denies fevers or chills.  She notes that she had pain in the right knee in the hospital and underwent arthrocentesis and was found to have gout crystals.   Knee Pain      Home Medications Prior to Admission medications   Medication Sig Start Date End Date Taking? Authorizing Provider  colchicine 0.6 MG tablet Take 1 tablet (0.6 mg total) by mouth 2 (two) times daily for 5 days. 09/13/22 09/18/22 Yes Joby Hershkowitz A, PA-C  HYDROmorphone (DILAUDID) 2 MG tablet Take 0.5 tablets (1 mg total) by mouth every 4 (four) hours as needed for up to 12 doses for severe pain. 09/13/22  Yes Rito Lecomte A, PA-C  ACCU-CHEK AVIVA PLUS test strip  07/25/18   [provider]  acetaminophen (TYLENOL) 650 MG CR tablet Take 650 mg by mouth every 8 (eight) hours as needed for pain.    [provider]  albuterol (PROVENTIL HFA;VENTOLIN HFA) 108 (90 Base) MCG/ACT inhaler Inhale 1-2 puffs into the lungs every 6 (six) hours as needed for wheezing or shortness of breath. Patient not taking: Reported on 08/03/2022 01/24/18   Couture, Cortni S, PA-C  atorvastatin (LIPITOR) 80 MG tablet  Take 80 mg by mouth daily.    [provider]  benzonatate (TESSALON) 100 MG capsule Take 1 capsule (100 mg total) by mouth every 8 (eight) hours. Patient not taking: Reported on 09/28/2018 01/24/18   Couture, Cortni S, PA-C  Blood Glucose Monitoring Suppl (TRUE METRIX METER) w/Device KIT  06/26/21   [provider]  docusate sodium (COLACE) 100 MG capsule Take 1 capsule (100 mg total) by mouth 2 (two) times daily. 08/26/22   Magnant, Charles L, PA-C  escitalopram (LEXAPRO) 20 MG tablet Take 20 mg by mouth daily as needed (anxiety). Patient not taking: Reported on 08/12/2022 06/26/21   [provider]  estradiol (ESTRACE) 0.1 MG/GM vaginal cream Place 1 Applicatorful vaginally 2 (two) times a week. 06/26/21   [provider]  Insulin Glargine (BASAGLAR KWIKPEN) 100 UNIT/ML Inject 28 Units into the skin daily as needed (blood glucose greater than 120).    [provider]  Lancets [facility] DELICA PLUS [place]) MISC  09/21/18   [provider]  methocarbamol (ROBAXIN) 500 MG tablet Take 1 tablet (500 mg total) by mouth every 8 (eight) hours as needed for muscle spasms. 08/26/22   Magnant, Charles L, PA-C  MYRBETRIQ 25 MG TB24 tablet Take 25 mg by mouth daily. 03/04/21   [provider]  rivaroxaban (XARELTO) 10 MG TABS tablet Take 1 tablet (10 mg total)  by mouth daily. 08/27/22   Magnant, Charles L, PA-C  TOVIAZ 4 MG TB24 tablet Take 4 mg by mouth daily. 07/17/18   [provider]  valsartan (DIOVAN) 80 MG tablet Take 80 mg by mouth daily. 08/01/18   [provider]  Vitamin D, Ergocalciferol, (DRISDOL) 1.25 MG (50000 UT) CAPS capsule Take 50,000 Units by mouth every 7 (seven) days.    [provider]      Allergies    Aspirin, Clorazepate dipotassium, Ketoprofen, and Oxycodone-aspirin    Review of Systems   Review of Systems  Physical Exam Updated Vital Signs BP 136/63   Pulse (!) 51   Temp 98.2 F (36.8  C) (Oral)   Resp 17   Ht 5\' 3"  (1.6 m)   Wt 81.6 kg   LMP  (LMP Unknown)   SpO2 100%   BMI 31.89 kg/m  Physical Exam Vitals and nursing note reviewed.  Constitutional:      General: She is not in acute distress.    Appearance: She is well-developed.  HENT:     Head: Normocephalic and atraumatic.  Eyes:     Conjunctiva/sclera: Conjunctivae normal.  Cardiovascular:     Rate and Rhythm: Normal rate and regular rhythm.     Heart sounds: No murmur heard. Pulmonary:     Effort: Pulmonary effort is normal. No respiratory distress.     Breath sounds: Normal breath sounds.  Abdominal:     Palpations: Abdomen is soft.     Tenderness: There is no abdominal tenderness.  Musculoskeletal:        General: No swelling.     Cervical back: Neck supple.     Comments: Mild swelling suprapatellar area of right knee.  Minimal swelling right ankle.  Range of motion limited in the secondary to pain on the right.  AP PT pulses are 2+ on the right.  Tenderness to the right calf is mild.  Skin:    General: Skin is warm and dry.     Capillary Refill: Capillary refill takes less than 2 seconds.  Neurological:     General: No focal deficit present.     Mental Status: She is alert and oriented to person, place, and time.  Psychiatric:        Mood and Affect: Mood normal.     ED Results / Procedures / Treatments   Labs (all labs ordered are listed, but only abnormal results are displayed) Labs Reviewed  CBC WITH DIFFERENTIAL/PLATELET - Abnormal; Notable for the following components:      Result Value   RBC 3.79 (*)    Hemoglobin 11.4 (*)    Neutro Abs 1.2 (*)    All other components within normal limits  COMPREHENSIVE METABOLIC PANEL - Abnormal; Notable for the following components:   Glucose, Bld 113 (*)    Albumin 3.1 (*)    GFR, Estimated 58 (*)    All other components within normal limits  URIC ACID    EKG None  Radiology VAS [place] LOWER EXTREMITY VENOUS (DVT) (7a-7p)  Result Date:  09/13/2022  Lower Venous DVT Study Patient Name:  Mckenzie Rogers  Date of Exam:   09/13/2022 Medical Rec #: [id number]           Accession #:    [id number] Date of Birth: Dec 12, 1940           Patient Gender: F Patient Age:   3 years Exam Location:  [facility] Procedure:  VAS [place] LOWER EXTREMITY VENOUS (DVT) Referring Phys: Verita Kuroda --------------------------------------------------------------------------------  Indications: Pain. Other Indications: Per patient - recently has fluid removed from behind right                    knee. Recent diagnosis of gout. Limitations: Scannin conditions - lights of for RN to start IV. Performing Technologist: Ernestene Mention RVT, RDMS  Examination Guidelines: A complete evaluation includes B-mode imaging, spectral Doppler, color Doppler, and power Doppler as needed of all accessible portions of each vessel. Bilateral testing is considered an integral part of a complete examination. Limited examinations for reoccurring indications may be performed as noted. The reflux portion of the exam is performed with the patient in reverse Trendelenburg.  +---------+---------------+---------+-----------+----------+--------------+ RIGHT    CompressibilityPhasicitySpontaneityPropertiesThrombus Aging +---------+---------------+---------+-----------+----------+--------------+ CFV      Full           Yes      Yes                                 +---------+---------------+---------+-----------+----------+--------------+ SFJ      Full                                                        +---------+---------------+---------+-----------+----------+--------------+ FV Prox  Full           Yes      Yes                                 +---------+---------------+---------+-----------+----------+--------------+ FV Mid   Full           Yes      Yes                                 +---------+---------------+---------+-----------+----------+--------------+ FV  DistalFull           Yes      Yes                                 +---------+---------------+---------+-----------+----------+--------------+ PFV      Full                                                        +---------+---------------+---------+-----------+----------+--------------+ POP      Full           Yes      Yes                                 +---------+---------------+---------+-----------+----------+--------------+ PTV      Full                                                        +---------+---------------+---------+-----------+----------+--------------+  PERO     Full                                                        +---------+---------------+---------+-----------+----------+--------------+   +----+---------------+---------+-----------+----------+--------------+ LEFTCompressibilityPhasicitySpontaneityPropertiesThrombus Aging +----+---------------+---------+-----------+----------+--------------+ CFV Full           Yes      Yes                                 +----+---------------+---------+-----------+----------+--------------+     Summary: RIGHT: - There is no evidence of deep vein thrombosis in the lower extremity.  - No cystic structure found in the popliteal fossa.  LEFT: - No evidence of common femoral vein obstruction.  *See table(s) above for measurements and observations.    Preliminary    DG Knee Complete 4 Views Right  Result Date: 09/13/2022 CLINICAL DATA:  History of gout.  Right knee pain. EXAM: RIGHT KNEE - COMPLETE 4+ VIEW COMPARISON:  Right knee radiographs 08/09/2022 and 11/06/2021 FINDINGS: There is diffuse decreased bone mineralization. Severe medial compartment and moderate lateral compartment joint space narrowing and peripheral degenerative osteophytes. Medial compartment bone-on-bone contact with subchondral sclerosis and cystic change. Severe patellofemoral joint space narrowing. Moderate superior and mild inferior  patellar degenerative osteophytes. Minimal chronic enthesopathic change at the quadriceps insertion on the patella. Small joint effusion, new from prior. No acute fracture or dislocation. IMPRESSION: 1. Severe medial compartment and patellofemoral compartment osteoarthritis. 2. Small joint effusion, new from prior. Electronically Signed   By: Neita Garnet M.D.   On: 09/13/2022 18:02    Procedures Procedures    Medications Ordered in ED Medications  predniSONE (DELTASONE) tablet 20 mg (has no administration in time range)  ketorolac (TORADOL) injection 15 mg (15 mg Intramuscular Given 09/13/22 1604)  sodium chloride 0.9 % bolus 500 mL (0 mLs Intravenous Stopped 09/13/22 1931)  morphine (PF) 4 MG/ML injection 4 mg (4 mg Intravenous Given 09/13/22 1808)    ED Course/ Medical Decision Making/ A&P                           Medical Decision Making This patient presents to the ED for concern of right knee pain and swelling, this involves an extensive number of treatment options, and is a complaint that carries with it a high risk of complications and morbidity.  The differential diagnosis includes arthritis, hemarthrosis, gout, osteoarthritis, fracture, other   Co morbidities that complicate the patient evaluation  Gout, osteoarthritis, diabetes   Additional history obtained:  Additional history obtained from EMR External records from outside source obtained and reviewed including orthopedic notes from hospitalization for recent knee replacement   Lab Tests:  I Ordered, and personally interpreted labs.  The pertinent results include: CBC BMP and uric acid are reassuring   Imaging Studies ordered:  I ordered imaging studies including x-rays I independently visualized and interpreted imaging which showed no fracture, no dislocation, osteoarthritis noted I agree with the radiologist interpretation      Consultations Obtained:  I requested consultation with the on-call orthopedic  doctor Sw[place],  and discussed lab and imaging findings as well as pertinent plan - they recommend: Single dose 20 mg prednisone in the ED, sent home with analgesics-was previously on  p.o. Dilaudid-and colchicine for her arthritis/gout and follow-up in the office.   Problem List / ED Course / Critical interventions / Medication management  Right knee pain-likely osteoarthritis versus gout.  She has a very small effusion on ultrasound, no signs of septic arthritis, do not feel arthrocentesis indicated at this time.  Benefits are not outweighed by risks.  Patient's pain improved by Toradol and morphine in the ED.  Blood pressure was slightly soft initially, this is not out of her range of previous blood pressures, small fluid bolus was ordered and did improve her blood pressure.  Sent home with recommendations from orthopedics and to follow-up with orthopedics this week.  Advised on follow-up and return precautions. I ordered medication including  for pain Reevaluation of the patient after these medicines showed that the patient improved I have reviewed the patients home medicines and have made adjustments as needed   Social Determinants of Health:  Patient resides in rehab facility   Test / Admission - Considered:  Considered arthrocentesis, documented above do not feel benefits outweigh the risks of infection or bleeding    Amount and/or Complexity of Data Reviewed Labs: ordered. Radiology: ordered.  Risk Prescription drug management.           Final Clinical Impression(s) / ED Diagnoses Final diagnoses:  Right knee pain, unspecified chronicity    Rx / DC Orders ED Discharge Orders          Ordered    HYDROmorphone (DILAUDID) 2 MG tablet  Every 4 hours PRN        09/13/22 2029    colchicine 0.6 MG tablet  2 times daily        09/13/22 2029              Josem Kaufmann 09/13/22 2216    Rondel Baton, MD 09/15/22 [phone]

## 2022-09-13 NOTE — Discharge Instructions (Signed)
Seen today for your knee pain.  This is likely due to your arthritis and gout.  I talked to the orthopedic doctor on-call and he suggested giving you a dose of steroids here and sending you home with some colchicine and pain medication.  They want you to call for an appointment this week.  Come back to the ER for any new or worsening symptoms.

## 2022-09-13 NOTE — ED Triage Notes (Signed)
Patient brought in by EMS from [facility] with c/o right knee pain related to gout. She has a HX of gout, EMS states she had fluid removed from knee recently and had recent L total knee replacement.

## 2022-09-14 DIAGNOSIS — Z7401 Bed confinement status: Secondary | ICD-10-CM | POA: Diagnosis not present

## 2022-09-14 DIAGNOSIS — M25561 Pain in right knee: Secondary | ICD-10-CM | POA: Diagnosis not present

## 2022-09-14 DIAGNOSIS — M109 Gout, unspecified: Secondary | ICD-10-CM | POA: Diagnosis not present

## 2022-09-14 DIAGNOSIS — G8929 Other chronic pain: Secondary | ICD-10-CM | POA: Diagnosis not present

## 2022-09-14 NOTE — ED Notes (Signed)
[facility] called for transport.  

## 2022-09-15 DIAGNOSIS — Z79899 Other long term (current) drug therapy: Secondary | ICD-10-CM | POA: Diagnosis not present

## 2022-09-16 ENCOUNTER — Ambulatory Visit: Payer: Medicare HMO | Admitting: Podiatry

## 2022-09-16 ENCOUNTER — Telehealth: Payer: Self-pay

## 2022-09-16 NOTE — Telephone Encounter (Signed)
     Patient  visit on 1/2  at [facility]    Patient is doing ok and has had all medications filled except the one her dr took her off of for surgery. Patient  followed up with PCP or other provider Patient is in a nursing facility   Have you been able to follow up with your primary care physician? Yes   The patient was or was not able to obtain any needed medicine or equipment. Yes   Are there diet recommendations that you are having difficulty following? Na   Patient expresses understanding of discharge instructions and education provided has no other needs at this time.  Yes       [place], [facility], Care Management  [phone] [address], [place], [place] [address] Phone: [phone] Email: [email or web address]

## 2022-09-24 DIAGNOSIS — M25561 Pain in right knee: Secondary | ICD-10-CM | POA: Diagnosis not present

## 2022-09-24 DIAGNOSIS — Z96652 Presence of left artificial knee joint: Secondary | ICD-10-CM | POA: Diagnosis not present

## 2022-09-24 DIAGNOSIS — E119 Type 2 diabetes mellitus without complications: Secondary | ICD-10-CM | POA: Diagnosis not present

## 2022-09-26 DIAGNOSIS — F32A Depression, unspecified: Secondary | ICD-10-CM | POA: Diagnosis not present

## 2022-09-26 DIAGNOSIS — E119 Type 2 diabetes mellitus without complications: Secondary | ICD-10-CM | POA: Diagnosis not present

## 2022-09-26 DIAGNOSIS — M10071 Idiopathic gout, right ankle and foot: Secondary | ICD-10-CM | POA: Diagnosis not present

## 2022-09-26 DIAGNOSIS — M1711 Unilateral primary osteoarthritis, right knee: Secondary | ICD-10-CM | POA: Diagnosis not present

## 2022-09-26 DIAGNOSIS — M10061 Idiopathic gout, right knee: Secondary | ICD-10-CM | POA: Diagnosis not present

## 2022-09-26 DIAGNOSIS — Z471 Aftercare following joint replacement surgery: Secondary | ICD-10-CM | POA: Diagnosis not present

## 2022-09-26 DIAGNOSIS — I10 Essential (primary) hypertension: Secondary | ICD-10-CM | POA: Diagnosis not present

## 2022-09-26 DIAGNOSIS — E785 Hyperlipidemia, unspecified: Secondary | ICD-10-CM | POA: Diagnosis not present

## 2022-09-26 DIAGNOSIS — R350 Frequency of micturition: Secondary | ICD-10-CM | POA: Diagnosis not present

## 2022-09-29 ENCOUNTER — Other Ambulatory Visit: Payer: Self-pay | Admitting: Surgical

## 2022-09-29 ENCOUNTER — Telehealth: Payer: Self-pay | Admitting: Orthopedic Surgery

## 2022-09-29 ENCOUNTER — Encounter: Payer: Medicare HMO | Admitting: Orthopedic Surgery

## 2022-09-29 MED ORDER — COLCHICINE 0.6 MG PO TABS: 0.6000 mg | ORAL_TABLET | Freq: Two times a day (BID) | ORAL | 0 refills | Status: DC

## 2022-09-29 NOTE — Telephone Encounter (Signed)
Patient states she has gout and her feet are so swollen she can't put her shoes on she canceled her appt for today and wanted to see if someone could help her. Sending call to triage nurse.

## 2022-09-29 NOTE — Telephone Encounter (Signed)
I refilled colchicine for her.  Hope this will help and we can reschedule her. She had gout in her right knee in hospital.  We can look at that right leg when she comes in for next appt

## 2022-09-30 DIAGNOSIS — M10061 Idiopathic gout, right knee: Secondary | ICD-10-CM | POA: Diagnosis not present

## 2022-09-30 DIAGNOSIS — E119 Type 2 diabetes mellitus without complications: Secondary | ICD-10-CM | POA: Diagnosis not present

## 2022-09-30 DIAGNOSIS — F32A Depression, unspecified: Secondary | ICD-10-CM | POA: Diagnosis not present

## 2022-09-30 DIAGNOSIS — E785 Hyperlipidemia, unspecified: Secondary | ICD-10-CM | POA: Diagnosis not present

## 2022-09-30 DIAGNOSIS — M10071 Idiopathic gout, right ankle and foot: Secondary | ICD-10-CM | POA: Diagnosis not present

## 2022-09-30 DIAGNOSIS — M1711 Unilateral primary osteoarthritis, right knee: Secondary | ICD-10-CM | POA: Diagnosis not present

## 2022-09-30 DIAGNOSIS — Z471 Aftercare following joint replacement surgery: Secondary | ICD-10-CM | POA: Diagnosis not present

## 2022-09-30 DIAGNOSIS — R350 Frequency of micturition: Secondary | ICD-10-CM | POA: Diagnosis not present

## 2022-09-30 DIAGNOSIS — I10 Essential (primary) hypertension: Secondary | ICD-10-CM | POA: Diagnosis not present

## 2022-09-30 NOTE — Telephone Encounter (Signed)
Tried calling patient to advise. No answer. Will try to follow up.

## 2022-10-01 DIAGNOSIS — M1711 Unilateral primary osteoarthritis, right knee: Secondary | ICD-10-CM | POA: Diagnosis not present

## 2022-10-01 DIAGNOSIS — R350 Frequency of micturition: Secondary | ICD-10-CM | POA: Diagnosis not present

## 2022-10-01 DIAGNOSIS — M10071 Idiopathic gout, right ankle and foot: Secondary | ICD-10-CM | POA: Diagnosis not present

## 2022-10-01 DIAGNOSIS — F32A Depression, unspecified: Secondary | ICD-10-CM | POA: Diagnosis not present

## 2022-10-01 DIAGNOSIS — I10 Essential (primary) hypertension: Secondary | ICD-10-CM | POA: Diagnosis not present

## 2022-10-01 DIAGNOSIS — M10061 Idiopathic gout, right knee: Secondary | ICD-10-CM | POA: Diagnosis not present

## 2022-10-01 DIAGNOSIS — E119 Type 2 diabetes mellitus without complications: Secondary | ICD-10-CM | POA: Diagnosis not present

## 2022-10-01 DIAGNOSIS — E785 Hyperlipidemia, unspecified: Secondary | ICD-10-CM | POA: Diagnosis not present

## 2022-10-01 DIAGNOSIS — Z471 Aftercare following joint replacement surgery: Secondary | ICD-10-CM | POA: Diagnosis not present

## 2022-10-01 NOTE — Telephone Encounter (Signed)
Tried calling patient to advise. No answer.  

## 2022-10-04 ENCOUNTER — Emergency Department (HOSPITAL_COMMUNITY): Payer: Medicare HMO

## 2022-10-04 ENCOUNTER — Other Ambulatory Visit: Payer: Self-pay

## 2022-10-04 ENCOUNTER — Inpatient Hospital Stay (HOSPITAL_COMMUNITY)
Admission: EM | Admit: 2022-10-04 | Discharge: 2022-10-12 | DRG: 554 | Disposition: A | Discharge status: Skilled Nursing Facility | Payer: Medicare HMO | Attending: Internal Medicine | Admitting: Internal Medicine

## 2022-10-04 DIAGNOSIS — M170 Bilateral primary osteoarthritis of knee: Secondary | ICD-10-CM | POA: Diagnosis present

## 2022-10-04 DIAGNOSIS — F339 Major depressive disorder, recurrent, unspecified: Secondary | ICD-10-CM | POA: Diagnosis not present

## 2022-10-04 DIAGNOSIS — Z888 Allergy status to other drugs, medicaments and biological substances status: Secondary | ICD-10-CM

## 2022-10-04 DIAGNOSIS — N390 Urinary tract infection, site not specified: Secondary | ICD-10-CM | POA: Diagnosis present

## 2022-10-04 DIAGNOSIS — G4733 Obstructive sleep apnea (adult) (pediatric): Secondary | ICD-10-CM | POA: Diagnosis present

## 2022-10-04 DIAGNOSIS — G8929 Other chronic pain: Secondary | ICD-10-CM | POA: Diagnosis not present

## 2022-10-04 DIAGNOSIS — R5383 Other fatigue: Secondary | ICD-10-CM | POA: Diagnosis not present

## 2022-10-04 DIAGNOSIS — I129 Hypertensive chronic kidney disease with stage 1 through stage 4 chronic kidney disease, or unspecified chronic kidney disease: Secondary | ICD-10-CM | POA: Diagnosis present

## 2022-10-04 DIAGNOSIS — E1122 Type 2 diabetes mellitus with diabetic chronic kidney disease: Secondary | ICD-10-CM | POA: Diagnosis present

## 2022-10-04 DIAGNOSIS — Z6831 Body mass index (BMI) 31.0-31.9, adult: Secondary | ICD-10-CM | POA: Diagnosis not present

## 2022-10-04 DIAGNOSIS — Z86711 Personal history of pulmonary embolism: Secondary | ICD-10-CM | POA: Diagnosis not present

## 2022-10-04 DIAGNOSIS — E785 Hyperlipidemia, unspecified: Secondary | ICD-10-CM | POA: Diagnosis present

## 2022-10-04 DIAGNOSIS — R5381 Other malaise: Secondary | ICD-10-CM | POA: Diagnosis not present

## 2022-10-04 DIAGNOSIS — M25571 Pain in right ankle and joints of right foot: Secondary | ICD-10-CM | POA: Diagnosis not present

## 2022-10-04 DIAGNOSIS — M10071 Idiopathic gout, right ankle and foot: Secondary | ICD-10-CM | POA: Diagnosis not present

## 2022-10-04 DIAGNOSIS — N182 Chronic kidney disease, stage 2 (mild): Secondary | ICD-10-CM | POA: Diagnosis present

## 2022-10-04 DIAGNOSIS — M25461 Effusion, right knee: Secondary | ICD-10-CM | POA: Diagnosis not present

## 2022-10-04 DIAGNOSIS — E8809 Other disorders of plasma-protein metabolism, not elsewhere classified: Secondary | ICD-10-CM | POA: Diagnosis not present

## 2022-10-04 DIAGNOSIS — Z794 Long term (current) use of insulin: Secondary | ICD-10-CM | POA: Diagnosis not present

## 2022-10-04 DIAGNOSIS — Z91199 Patient's noncompliance with other medical treatment and regimen due to unspecified reason: Secondary | ICD-10-CM

## 2022-10-04 DIAGNOSIS — M109 Gout, unspecified: Secondary | ICD-10-CM | POA: Diagnosis present

## 2022-10-04 DIAGNOSIS — Z96652 Presence of left artificial knee joint: Secondary | ICD-10-CM | POA: Diagnosis present

## 2022-10-04 DIAGNOSIS — D631 Anemia in chronic kidney disease: Secondary | ICD-10-CM | POA: Diagnosis present

## 2022-10-04 DIAGNOSIS — M10061 Idiopathic gout, right knee: Secondary | ICD-10-CM | POA: Diagnosis not present

## 2022-10-04 DIAGNOSIS — I959 Hypotension, unspecified: Secondary | ICD-10-CM | POA: Diagnosis not present

## 2022-10-04 DIAGNOSIS — M6281 Muscle weakness (generalized): Secondary | ICD-10-CM | POA: Diagnosis not present

## 2022-10-04 DIAGNOSIS — M1A9XX0 Chronic gout, unspecified, without tophus (tophi): Principal | ICD-10-CM | POA: Diagnosis present

## 2022-10-04 DIAGNOSIS — F32A Depression, unspecified: Secondary | ICD-10-CM | POA: Diagnosis present

## 2022-10-04 DIAGNOSIS — Z743 Need for continuous supervision: Secondary | ICD-10-CM | POA: Diagnosis not present

## 2022-10-04 DIAGNOSIS — E669 Obesity, unspecified: Secondary | ICD-10-CM | POA: Diagnosis present

## 2022-10-04 DIAGNOSIS — E119 Type 2 diabetes mellitus without complications: Secondary | ICD-10-CM

## 2022-10-04 DIAGNOSIS — Z885 Allergy status to narcotic agent status: Secondary | ICD-10-CM | POA: Diagnosis not present

## 2022-10-04 DIAGNOSIS — N3000 Acute cystitis without hematuria: Secondary | ICD-10-CM | POA: Diagnosis present

## 2022-10-04 DIAGNOSIS — E1165 Type 2 diabetes mellitus with hyperglycemia: Secondary | ICD-10-CM | POA: Diagnosis not present

## 2022-10-04 DIAGNOSIS — Z86718 Personal history of other venous thrombosis and embolism: Secondary | ICD-10-CM | POA: Diagnosis not present

## 2022-10-04 DIAGNOSIS — Z833 Family history of diabetes mellitus: Secondary | ICD-10-CM

## 2022-10-04 DIAGNOSIS — N3281 Overactive bladder: Secondary | ICD-10-CM | POA: Diagnosis present

## 2022-10-04 DIAGNOSIS — Z1612 Extended spectrum beta lactamase (ESBL) resistance: Secondary | ICD-10-CM | POA: Diagnosis present

## 2022-10-04 DIAGNOSIS — B9620 Unspecified Escherichia coli [E. coli] as the cause of diseases classified elsewhere: Secondary | ICD-10-CM | POA: Diagnosis present

## 2022-10-04 DIAGNOSIS — Z7901 Long term (current) use of anticoagulants: Secondary | ICD-10-CM | POA: Diagnosis not present

## 2022-10-04 DIAGNOSIS — Z823 Family history of stroke: Secondary | ICD-10-CM

## 2022-10-04 DIAGNOSIS — M79604 Pain in right leg: Secondary | ICD-10-CM | POA: Diagnosis not present

## 2022-10-04 LAB — COMPREHENSIVE METABOLIC PANEL
ALT: 30 U/L (ref 0–44)
AST: 39 U/L (ref 15–41)
Albumin: 3.4 g/dL — ABNORMAL LOW (ref 3.5–5.0)
Alkaline Phosphatase: 68 U/L (ref 38–126)
Anion gap: 10 (ref 5–15)
BUN: 22 mg/dL (ref 8–23)
CO2: 25 mmol/L (ref 22–32)
Calcium: 9.5 mg/dL (ref 8.9–10.3)
Chloride: 100 mmol/L (ref 98–111)
Creatinine, Ser: 1.18 mg/dL — ABNORMAL HIGH (ref 0.44–1.00)
GFR, Estimated: 47 mL/min — ABNORMAL LOW (ref >60)
Glucose, Bld: 118 mg/dL — ABNORMAL HIGH (ref 70–99)
Potassium: 3.9 mmol/L (ref 3.5–5.1)
Sodium: 135 mmol/L (ref 135–145)
Total Bilirubin: 0.7 mg/dL (ref 0.3–1.2)
Total Protein: 9 g/dL — ABNORMAL HIGH (ref 6.5–8.1)

## 2022-10-04 LAB — URINALYSIS, ROUTINE W REFLEX MICROSCOPIC
Bilirubin Urine: NEGATIVE
Glucose, UA: NEGATIVE mg/dL
Ketones, ur: NEGATIVE mg/dL
Nitrite: POSITIVE — AB
Protein, ur: NEGATIVE mg/dL
Specific Gravity, Urine: 1.018 (ref 1.005–1.030)
pH: 5 (ref 5.0–8.0)

## 2022-10-04 LAB — CBG MONITORING, ED
Glucose-Capillary: 111 mg/dL — ABNORMAL HIGH (ref 70–99)
Glucose-Capillary: 124 mg/dL — ABNORMAL HIGH (ref 70–99)
Glucose-Capillary: 221 mg/dL — ABNORMAL HIGH (ref 70–99)

## 2022-10-04 LAB — CBC WITH DIFFERENTIAL/PLATELET
Abs Immature Granulocytes: 0.01 10*3/uL (ref 0.00–0.07)
Basophils Absolute: 0 10*3/uL (ref 0.0–0.1)
Basophils Relative: 1 %
Eosinophils Absolute: 0.2 10*3/uL (ref 0.0–0.5)
Eosinophils Relative: 4 %
HCT: 38.6 % (ref 36.0–46.0)
Hemoglobin: 12.1 g/dL (ref 12.0–15.0)
Immature Granulocytes: 0 %
Lymphocytes Relative: 47 %
Lymphs Abs: 2 10*3/uL (ref 0.7–4.0)
MCH: 29.9 pg (ref 26.0–34.0)
MCHC: 31.3 g/dL (ref 30.0–36.0)
MCV: 95.3 fL (ref 80.0–100.0)
Monocytes Absolute: 0.5 10*3/uL (ref 0.1–1.0)
Monocytes Relative: 11 %
Neutro Abs: 1.6 10*3/uL — ABNORMAL LOW (ref 1.7–7.7)
Neutrophils Relative %: 37 %
Platelets: 217 10*3/uL (ref 150–400)
RBC: 4.05 MIL/uL (ref 3.87–5.11)
RDW: 15.2 % (ref 11.5–15.5)
WBC: 4.3 10*3/uL (ref 4.0–10.5)
nRBC: 0 % (ref 0.0–0.2)

## 2022-10-04 MED ORDER — PIPERACILLIN-TAZOBACTAM 3.375 G IVPB
3.3750 g | Freq: Three times a day (TID) | INTRAVENOUS | Status: DC
  Administered 2022-10-04 – 2022-10-07 (×8): 3.375 g via INTRAVENOUS
  Filled 2022-10-04 (×8): qty 50

## 2022-10-04 MED ORDER — INSULIN ASPART 100 UNIT/ML IJ SOLN
0.0000 [IU] | Freq: Every day | INTRAMUSCULAR | Status: DC
  Administered 2022-10-04 – 2022-10-10 (×3): 2 [IU] via SUBCUTANEOUS
  Filled 2022-10-04: qty 0.05

## 2022-10-04 MED ORDER — ACETAMINOPHEN-CODEINE 300-30 MG PO TABS
1.0000 | ORAL_TABLET | Freq: Once | ORAL | Status: AC
  Administered 2022-10-04: 1 via ORAL
  Filled 2022-10-04: qty 1

## 2022-10-04 MED ORDER — PREDNISONE 20 MG PO TABS
20.0000 mg | ORAL_TABLET | Freq: Once | ORAL | Status: AC
  Administered 2022-10-04: 20 mg via ORAL
  Filled 2022-10-04: qty 1

## 2022-10-04 MED ORDER — FOSFOMYCIN TROMETHAMINE 3 G PO PACK
3.0000 g | PACK | Freq: Once | ORAL | Status: AC
  Administered 2022-10-04: 3 g via ORAL
  Filled 2022-10-04: qty 3

## 2022-10-04 MED ORDER — INSULIN ASPART 100 UNIT/ML IJ SOLN
0.0000 [IU] | Freq: Three times a day (TID) | INTRAMUSCULAR | Status: DC
  Administered 2022-10-05 – 2022-10-06 (×2): 5 [IU] via SUBCUTANEOUS
  Administered 2022-10-06: 2 [IU] via SUBCUTANEOUS
  Filled 2022-10-04: qty 0.15

## 2022-10-04 NOTE — Assessment & Plan Note (Signed)
Pt with hx of multiple resistances  Order urine culture  For tonight cover with zosyn

## 2022-10-04 NOTE — Assessment & Plan Note (Addendum)
Pain control , orthopedics consult in Am   Start on prednisone 40 mg po q day  Check uric acid level  Colchicine 0.5 mg po BID

## 2022-10-04 NOTE — Progress Notes (Signed)
The daughter called back and requested that we go head and put the home health order in before they left the hospital this CSW reached out to [facility], [facility], [place] is the agency that took the family. At this time [facility] has met the family needs. TOC is signing off.

## 2022-10-04 NOTE — Discharge Instructions (Addendum)
You were seen in the ER for evaluation of your right ankle pain. I likely think this is your gout. I have sent in a few days of narcotic pain medication for your pain. This may make you constipated, so make sure you are taking a stool softner. Additionally, I would like for you to see your orthopedic provider again. Please call them to schedule an appointment. If you have any concerns, new or worsening symptoms, please return to the ER for re-evaluation.   Contact a doctor if: You have another gout attack. You still have symptoms of a gout attack after 10 days of treatment. You have problems (side effects) because of your medicines. You have chills or a fever. You have burning pain when you pee (urinate). You have pain in your lower back or belly. Get help right away if: You have very bad pain. Your pain cannot be controlled. You cannot pee.   Carbohydrate Counting For People With Diabetes  Foods with carbohydrates make your blood glucose level go up. Learning how to count carbohydrates can help you control your blood glucose levels. First, identify the foods you eat that contain carbohydrates. Then, using the Foods with Carbohydrates chart, determine about how much carbohydrates are in your meals and snacks. Make sure you are eating foods with fiber, protein, and healthy fat along with your carbohydrate foods. Foods with Carbohydrates The following table shows carbohydrate foods that have about 15 grams of carbohydrate each. Using measuring cups, spoons, or a food scale when you first begin learning about carbohydrate counting can help you learn about the portion sizes you typically eat. The following foods have 15 grams carbohydrate each:  Grains 1 slice bread (1 ounce)  1 small tortilla (6-inch size)   large bagel (1 ounce)  1/3 cup pasta or rice (cooked)   hamburger or hot dog bun ( ounce)   cup cooked cereal   to  cup ready-to-eat cereal  2 taco shells (5-inch size) Fruit 1  small fresh fruit ( to 1 cup)   medium banana  17 small grapes (3 ounces)  1 cup melon or berries   cup canned or frozen fruit  2 tablespoons dried fruit (blueberries, cherries, cranberries, raisins)   cup unsweetened fruit juice  Starchy Vegetables  cup cooked beans, peas, corn, potatoes/sweet potatoes   large baked potato (3 ounces)  1 cup acorn or butternut squash  Snack Foods 3 to 6 crackers  8 potato chips or 13 tortilla chips ( ounce to 1 ounce)  3 cups popped popcorn  Dairy 3/4 cup (6 ounces) nonfat plain yogurt, or yogurt with sugar-free sweetener  1 cup milk  1 cup plain rice, soy, coconut or flavored almond milk Sweets and Desserts  cup ice cream or frozen yogurt  1 tablespoon jam, jelly, pancake syrup, table sugar, or honey  2 tablespoons light pancake syrup  1 inch square of frosted cake or 2 inch square of unfrosted cake  2 small cookies (2/3 ounce each) or  large cookie  Sometimes you'll have to estimate carbohydrate amounts if you don't know the exact recipe. One cup of mixed foods like soups can have 1 to 2 carbohydrate servings, while some casseroles might have 2 or more servings of carbohydrate. Foods that have less than 20 calories in each serving can be counted as "free" foods. Count 1 cup raw vegetables, or  cup cooked non-starchy vegetables as "free" foods. If you eat 3 or more servings at one meal, then count them  as 1 carbohydrate serving.  Foods without Carbohydrates  Not all foods contain carbohydrates. Meat, some dairy, fats, non-starchy vegetables, and many beverages don't contain carbohydrate. So when you count carbohydrates, you can generally exclude chicken, pork, beef, fish, seafood, eggs, tofu, cheese, butter, sour cream, avocado, nuts, seeds, olives, mayonnaise, water, black coffee, unsweetened tea, and zero-calorie drinks. Vegetables with no or low carbohydrate include green beans, cauliflower, tomatoes, and onions. How much carbohydrate should  I eat at each meal?  Carbohydrate counting can help you plan your meals and manage your weight. Following are some starting points for carbohydrate intake at each meal. Work with your registered dietitian nutritionist to find the best range that works for your blood glucose and weight.   To Lose Weight To Maintain Weight  Women 2 - 3 carb servings 3 - 4 carb servings  Men 3 - 4 carb servings 4 - 5 carb servings  Checking your blood glucose after meals will help you know if you need to adjust the timing, type, or number of carbohydrate servings in your meal plan. Achieve and keep a healthy body weight by balancing your food intake and physical activity.  Tips How should I plan my meals?  Plan for half the food on your plate to include non-starchy vegetables, like salad greens, broccoli, or carrots. Try to eat 3 to 5 servings of non-starchy vegetables every day. Have a protein food at each meal. Protein foods include chicken, fish, meat, eggs, or beans (note that beans contain carbohydrate). These two food groups (non-starchy vegetables and proteins) are low in carbohydrate. If you fill up your plate with these foods, you will eat less carbohydrate but still fill up your stomach. Try to limit your carbohydrate portion to  of the plate.  What fats are healthiest to eat?  Diabetes increases risk for heart disease. To help protect your heart, eat more healthy fats, such as olive oil, nuts, and avocado. Eat less saturated fats like butter, cream, and high-fat meats, like bacon and sausage. Avoid trans fats, which are in all foods that list "partially hydrogenated oil" as an ingredient. What should I drink?  Choose drinks that are not sweetened with sugar. The healthiest choices are water, carbonated or seltzer waters, and tea and coffee without added sugars.  Sweet drinks will make your blood glucose go up very quickly. One serving of soda or energy drink is  cup. It is best to drink these beverages only  if your blood glucose is low.  Artificially sweetened, or diet drinks, typically do not increase your blood glucose if they have zero calories in them. Read labels of beverages, as some diet drinks do have carbohydrate and will raise your blood glucose. Label Reading Tips Read Nutrition Facts labels to find out how many grams of carbohydrate are in a food you want to eat. Don't forget: sometimes serving sizes on the label aren't the same as how much food you are going to eat, so you may need to calculate how much carbohydrate is in the food you are serving yourself.   Carbohydrate Counting for People with Diabetes Sample 1-Day Menu  Breakfast  cup yogurt, low fat, low sugar (1 carbohydrate serving)   cup cereal, ready-to-eat, unsweetened (1 carbohydrate serving)  1 cup strawberries (1 carbohydrate serving)   cup almonds ( carbohydrate serving)  Lunch 1, 5 ounce can chunk light tuna  2 ounces cheese, low fat cheddar  6 whole wheat crackers (1 carbohydrate serving)  1 small apple (  1 carbohydrate servings)   cup carrots ( carbohydrate serving)   cup snap peas  1 cup 1% milk (1 carbohydrate serving)   Evening Meal Stir fry made with: 3 ounces chicken  1 cup brown rice (3 carbohydrate servings)   cup broccoli ( carbohydrate serving)   cup green beans   cup onions  1 tablespoon olive oil  2 tablespoons teriyaki sauce ( carbohydrate serving)  Evening Snack 1 extra small banana (1 carbohydrate serving)  1 tablespoon peanut butter   Carbohydrate Counting for People with Diabetes Vegan Sample 1-Day Menu  Breakfast 1 cup cooked oatmeal (2 carbohydrate servings)   cup blueberries (1 carbohydrate serving)  2 tablespoons flaxseeds  1 cup soymilk fortified with calcium and vitamin D  1 cup coffee  Lunch 2 slices whole wheat bread (2 carbohydrate servings)   cup baked tofu   cup lettuce  2 slices tomato  2 slices avocado   cup baby carrots ( carbohydrate serving)  1 orange (1  carbohydrate serving)  1 cup soymilk fortified with calcium and vitamin D   Evening Meal Burrito made with: 1 6-inch corn tortilla (1 carbohydrate serving)  1 cup refried vegetarian beans (2 carbohydrate servings)   cup chopped tomatoes   cup lettuce   cup salsa  1/3 cup brown rice (1 carbohydrate serving)  1 tablespoon olive oil for rice   cup zucchini   Evening Snack 6 small whole grain crackers (1 carbohydrate serving)  2 apricots ( carbohydrate serving)   cup unsalted peanuts ( carbohydrate serving)    Carbohydrate Counting for People with Diabetes Vegetarian (Lacto-Ovo) Sample 1-Day Menu  Breakfast 1 cup cooked oatmeal (2 carbohydrate servings)   cup blueberries (1 carbohydrate serving)  2 tablespoons flaxseeds  1 egg  1 cup 1% milk (1 carbohydrate serving)  1 cup coffee  Lunch 2 slices whole wheat bread (2 carbohydrate servings)  2 ounces low-fat cheese   cup lettuce  2 slices tomato  2 slices avocado   cup baby carrots ( carbohydrate serving)  1 orange (1 carbohydrate serving)  1 cup unsweetened tea  Evening Meal Burrito made with: 1 6-inch corn tortilla (1 carbohydrate serving)   cup refried vegetarian beans (1 carbohydrate serving)   cup tomatoes   cup lettuce   cup salsa  1/3 cup brown rice (1 carbohydrate serving)  1 tablespoon olive oil for rice   cup zucchini  1 cup 1% milk (1 carbohydrate serving)  Evening Snack 6 small whole grain crackers (1 carbohydrate serving)  2 apricots ( carbohydrate serving)   cup unsalted peanuts ( carbohydrate serving)    Copyright 2020  Academy of Nutrition and Dietetics. All rights reserved.  Using Nutrition Labels: Carbohydrate  Serving Size  Look at the serving size. All the information on the label is based on this portion. Servings Per Container  The number of servings contained in the package. Guidelines for Carbohydrate  Look at the total grams of carbohydrate in the serving size.  1 carbohydrate  choice = 15 grams of carbohydrate. Range of Carbohydrate Grams Per Choice  Carbohydrate Grams/Choice Carbohydrate Choices  6-10   11-20 1  21-25 1  26-35 2  36-40 2  41-50 3  51-55 3  56-65 4  66-70 4  71-80 5    Copyright 2020  Academy of Nutrition and Dietetics. All rights reserved.

## 2022-10-04 NOTE — ED Provider Triage Note (Signed)
Emergency Medicine Provider Triage Evaluation Note  Mckenzie Rogers , a 82 y.o. female  was evaluated in triage.  Pt complains of right ankle pain since 08-13-2022.  Patient reports it has been constant.  Denies any trauma..  Review of Systems  Positive:  Negative:   Physical Exam  BP 137/83 (BP Location: Left Arm)   Pulse 79   Temp 98.9 F (37.2 C) (Oral)   Resp 18   LMP  (LMP Unknown)   SpO2 97%  Gen:   Awake, no distress   Resp:  Normal effort  MSK:   Moves extremities without difficulty  Other:  Warm ankle to the bilateral malleoli.  Some mild overlying erythema.  Palpable pulses.  Dry skin.  Medical Decision Making  Medically screening exam initiated at 1:30 PM.  Appropriate orders placed.  [name] was informed that the remainder of the evaluation will be completed by another provider, this initial triage assessment does not replace that evaluation, and the importance of remaining in the ED until their evaluation is complete.  XR ordered.   Sherrell Puller, PA-C 10/04/22 1332

## 2022-10-04 NOTE — ED Triage Notes (Signed)
BIBA from [facility] for c/o right ankle/foot pain 3-4 weeks. Seen on 1/1 for same.  Hx gout.  Swelling per ems. Pain increases when bearing weight.

## 2022-10-04 NOTE — Progress Notes (Signed)
Transition of Care [facility]) - Emergency Department Mini Assessment   Patient Details  Name: BREIONA COUVILLON MRN: [id number] Date of Birth: Apr 19, 1941  Transition of [facility] CM/SW Contact:    Rodney Booze, LCSW Phone Number: 10/04/2022, 7:05 PM   Clinical Narrative: CSW spoke to the patient and the daughter, the family at this time has their own agency they will use and at this time does not need any further assistance from [facility], Patient will DC home with family.    ED Mini Assessment: What brought you to the Emergency Department? : (P) Pain in foot ankle  Barriers to Discharge: (P) No Barriers Identified  Barrier interventions: (P) The patient was recently DC from [place] facility patient is currently in co-pay days will arrange [facility]     Interventions which prevented an admission or readmission: (P) [place] or Services    Patient Contact and Communications     Spoke with: (P) Daughter Contact Date: (P) 10/04/22,   Contact time: (P) 1904      Patient states their goals for this hospitalization and ongoing recovery are:: (P) HH to continue PT services. CMS Medicare.gov Compare Post Acute Care list provided to:: (P) Patient    Admission diagnosis:  right leg pain;gout Patient Active Problem List   Diagnosis Date Noted   Arthritis of left knee 08/21/2022   OA (osteoarthritis) of knee 08/19/2022   S/P TKR (total knee replacement), left 08/19/2022   Controlled type 2 diabetes mellitus without complication, without long-term current use of insulin ([place]) 07/31/2021   Genetic testing 03/14/2020   Family history of pancreatic cancer    Family history of kidney cancer    Family history of lung cancer    PCP:  [name], Fransico Him, MD Pharmacy:   [facility] [place], [place] - [address] [place] [address] Phone: [phone] Fax: [phone]

## 2022-10-04 NOTE — Assessment & Plan Note (Signed)
Order sliding scale, lantus at 20 uni ec qhs

## 2022-10-04 NOTE — Subjective & Objective (Signed)
Recently have had left knee replacement went home from SNF 1 wk ago  Has hx of gout Developed severe gout in her right ankle unable to bare weight Still able to move her ankle but unable to ambulate on her

## 2022-10-04 NOTE — ED Provider Notes (Signed)
[place] EMERGENCY DEPARTMENT AT [facility] Provider Note   CSN: [id number] Arrival date & time: 10/04/22  1311     History Chief Complaint  Patient presents with   Leg Swelling    Mckenzie Rogers is a 82 y.o. female gout, DM presents to the ER for eval of right ankle pain for the past 2 months. The patient reports that she has been having a gout flare that has not been improving. She has been taking her medication without much relief. She was recently discharged from rehab this week and reports that her gout worsened and she can not walk on that leg. Denies any fevers.   HPI     Home Medications Prior to Admission medications   Medication Sig Start Date End Date Taking? Authorizing Provider  ACCU-CHEK AVIVA PLUS test strip  07/25/18   [provider]  acetaminophen (TYLENOL) 650 MG CR tablet Take 650 mg by mouth every 8 (eight) hours as needed for pain.    [provider]  albuterol (PROVENTIL HFA;VENTOLIN HFA) 108 (90 Base) MCG/ACT inhaler Inhale 1-2 puffs into the lungs every 6 (six) hours as needed for wheezing or shortness of breath. Patient not taking: Reported on 08/03/2022 01/24/18   Couture, Cortni S, PA-C  atorvastatin (LIPITOR) 80 MG tablet Take 80 mg by mouth daily.    [provider]  benzonatate (TESSALON) 100 MG capsule Take 1 capsule (100 mg total) by mouth every 8 (eight) hours. Patient not taking: Reported on 09/28/2018 01/24/18   Couture, Cortni S, PA-C  Blood Glucose Monitoring Suppl (TRUE METRIX METER) w/Device KIT  06/26/21   [provider]  colchicine 0.6 MG tablet Take 1 tablet (0.6 mg total) by mouth 2 (two) times daily for 5 days. 09/29/22 10/04/22  Magnant, Charles L, PA-C  docusate sodium (COLACE) 100 MG capsule Take 1 capsule (100 mg total) by mouth 2 (two) times daily. 08/26/22   Magnant, Charles L, PA-C  escitalopram (LEXAPRO) 20 MG tablet Take 20 mg by mouth daily as needed (anxiety). Patient not  taking: Reported on 08/12/2022 06/26/21   [provider]  estradiol (ESTRACE) 0.1 MG/GM vaginal cream Place 1 Applicatorful vaginally 2 (two) times a week. 06/26/21   [provider]  HYDROmorphone (DILAUDID) 2 MG tablet Take 0.5 tablets (1 mg total) by mouth every 4 (four) hours as needed for up to 12 doses for severe pain. 09/13/22   Carmel Sacramento A, PA-C  Insulin Glargine (BASAGLAR KWIKPEN) 100 UNIT/ML Inject 28 Units into the skin daily as needed (blood glucose greater than 120).    [provider]  Lancets [facility] DELICA PLUS [place]) MISC  09/21/18   [provider]  methocarbamol (ROBAXIN) 500 MG tablet Take 1 tablet (500 mg total) by mouth every 8 (eight) hours as needed for muscle spasms. 08/26/22   Magnant, Charles L, PA-C  MYRBETRIQ 25 MG TB24 tablet Take 25 mg by mouth daily. 03/04/21   [provider]  rivaroxaban (XARELTO) 10 MG TABS tablet Take 1 tablet (10 mg total) by mouth daily. 08/27/22   Magnant, Charles L, PA-C  TOVIAZ 4 MG TB24 tablet Take 4 mg by mouth daily. 07/17/18   [provider]  valsartan (DIOVAN) 80 MG tablet Take 80 mg by mouth daily. 08/01/18   [provider]  Vitamin D, Ergocalciferol, (DRISDOL) 1.25 MG (50000 UT) CAPS capsule Take 50,000 Units by mouth every 7 (seven) days.    [provider]  Allergies    Aspirin, Clorazepate dipotassium, Ketoprofen, and Oxycodone-aspirin    Review of Systems   Review of Systems  Constitutional:  Negative for chills and fever.  Respiratory:  Negative for shortness of breath.   Cardiovascular:  Negative for chest pain.  Musculoskeletal:  Positive for arthralgias.    Physical Exam Updated Vital Signs BP 137/83 (BP Location: Left Arm)   Pulse 79   Temp 98.9 F (37.2 C) (Oral)   Resp 18   LMP  (LMP Unknown)   SpO2 97%  Physical Exam Vitals and nursing note reviewed.  Constitutional:      General: She is not in acute distress.     Appearance: Normal appearance. She is not toxic-appearing.  Eyes:     General: No scleral icterus. Pulmonary:     Effort: Pulmonary effort is normal. No respiratory distress.  Abdominal:     Tenderness: There is no abdominal tenderness. There is no right CVA tenderness, left CVA tenderness, guarding or rebound.  Musculoskeletal:     Comments: Mild pedal edema.  Cap refill brisk.  Dry skin to feet.  Palpable DP and PT pulses bilaterally that are symmetric as well.  Compartments are soft.  There is more swelling to the medial and lateral aspects of the ankle with some overlying warmth and erythema.  Full active and passive movement of the ankle without severe pain.   Skin:    General: Skin is dry.     Findings: No rash.  Neurological:     General: No focal deficit present.     Mental Status: She is alert. Mental status is at baseline.  Psychiatric:        Mood and Affect: Mood normal.     ED Results / Procedures / Treatments   Labs (all labs ordered are listed, but only abnormal results are displayed) Labs Reviewed  CBG MONITORING, ED - Abnormal; Notable for the following components:      Result Value   Glucose-Capillary 111 (*)    All other components within normal limits    EKG None  Radiology DG Ankle Complete Right  Result Date: 10/04/2022 CLINICAL DATA:  Right ankle pain without known injury. History of gout. EXAM: RIGHT ANKLE - COMPLETE 3+ VIEW COMPARISON:  None Available. FINDINGS: There is no evidence of fracture, dislocation, or joint effusion. Mild lateral soft tissue swelling is noted. Mild anterior osteophyte formation is seen involving the talotibial joint. IMPRESSION: Mild degenerative changes as described above. No acute abnormality seen. Electronically Signed   By: Marijo Conception M.D.   On: 10/04/2022 13:50    Procedures Procedures   Medications Ordered in ED Medications - No data to display  ED Course/ Medical Decision Making/ A&P                            Medical Decision Making Amount and/or Complexity of Data Reviewed Labs: ordered. Radiology: ordered.  Risk Prescription drug management. Decision regarding hospitalization.   82 year old female presents the emergency room today for evaluation of right ankle pain.  Differential diagnosis includes is limited to septic arthritis versus gout versus osteoarthritis versus cellulitis.  Vital signs are unremarkable.  Patient normotensive, afebrile, normal pulse rate, satting well on room air without increased work of breathing.  Physical exam as noted above.  Imaging ordered.  X-ray of the right ankle shows mild degenerative changes as described above with no acute abnormality seen.  On previous chart evaluation,  patient was previously seen here on 09/13/22 for right knee pain that was rating down to her right ankle.  She did have a ultrasound performed at that time that was negative.  At that time, orthopedics was consulted and was told to give a singular dose of prednisone 20 mg in her home with some analgesics.  Patient is allergic to Oxy.  She was given a Tylenol 3 while here as well as the singular dose of prednisone.  Towards the end of the visit, patient did mention that she now feels like she has a urinary tract infection and she is in having some pressure whenever she urinates the past week.  Reports some urgency and frequency.  Denies any dysuria or hematuria.  Denies any belly pain.  Her abdomen is soft and nontender.  She has no CVA tenderness.  Denies any fevers or nausea or vomiting.  Will need to obtain labs and urine because of this.  Delay in obtaining urine from nursing.  I independent reviewed interpret the patient's labs.  CBC without leukocytosis or anemia.  CMP shows glucose at 118.  Creatinine 1.18 which appears around her baseline.  Her urinalysis shows amber and hazy urine with moderate hemoglobin.  She is nitrite positive leukocytes as well as 21-50 white blood cells and many  bacteria.  Consistent with UTI.  On previous chart evaluation it does appear that her urinalysis appeared resistant to multiple antibiotics.  Discussed with my attending will order some fosfomycin for her UTI.  Offered to walk the patient with a cam boot.  She was given an Ace bandage.  She is absolutely refusing to ambulate or weight-bear on the foot however she does have full active and passive range of motion without severe pain.  The patient's daughter reports that she does not have any home health.  I discussed with social work to have this arranged for her is to be anticipated her being discharged.  I then spoke with the patient later after the daughter had left and she reports that she already has home health was able to do the exercises out of bed given her pain and have do them in the bed.  Given that she is refusing ambulation as well as a urinary tract infection, do benefit she probably would benefit from an admission with physical therapy and occupational evaluation.  Patient is amenable.  Admit to hospitalist.  I discussed this case with my attending physician who cosigned this note including patient's presenting symptoms, physical exam, and planned diagnostics and interventions. Attending physician stated agreement with plan or made changes to plan which were implemented.   Attending physician assessed patient at bedside.  Final Clinical Impression(s) / ED Diagnoses Final diagnoses:  Chronic pain of right ankle  Acute cystitis without hematuria    Rx / DC Orders ED Discharge Orders     None         Sherrell Puller, PA-C 10/05/22 0105    Elgie Congo, MD 10/06/22 1006

## 2022-10-04 NOTE — H&P (Signed)
Mckenzie Rogers SWN:[id number] DOB: [birth year] DOA: 10/04/2022    PCP: Gaspar Garbe, MD   Outpatient Specialists:   Orthopedics     Patient arrived to ER on 10/04/22 at 1311 Referred by Attending Mckenzie Norfolk, DO   Patient coming from:    home Lives  With family has home health    Chief Complaint:   Chief Complaint  Patient presents with   Leg Swelling    HPI: Mckenzie Rogers is a 82 y.o. female with medical history significant of gout, sp left knee replacement, remote DVT, DM2, CKD Presented with   right ankle swelling for 1 month Recently have had left knee replacement went home from SNF 1 wk ago  Has hx of gout Developed severe gout in her right ankle unable to bare weight Still able to move her ankle but unable to ambulate on her  left total knee arthroplasty on 08/19/2022.    Was on Xarelto for DVT prophylaxis with her history of previous DVT during pregnancy.   Doppler study was done on 09/13/2022 neg for DVT on the right  Does not smoke or drink  She has been walking with a walker Since she went home  She could not walk  Denis any fever or chills says at rehab she was given medication that gave her dairrhea but helped her gout  Reports dysuria and suprapubic pain   Regarding pertinent Chronic problems:      HTN on Valsartan      DM 2 -  Lab Results  Component Value Date   HGBA1C 7.5 (H) 08/09/2022   on insulin,       obesity-   BMI Readings from Last 1 Encounters:  09/13/22 31.89 kg/m      OSA - noncompliant with CPAP  Hx of DVT/PE  remote   CKD stage IIIa- baseline Cr1.1 CrCl cannot be calculated (Unknown ideal weight.).  Lab Results  Component Value Date   CREATININE 1.18 (H) 10/04/2022   CREATININE 0.99 09/13/2022   CREATININE 1.02 (H) 08/20/2022    Chronic anemia - baseline hg Hemoglobin & Hematocrit  Recent Labs    08/20/22 0413 09/13/22 1718 10/04/22 1851  HGB 11.1* 11.4* 12.1     While in ER:    Right ankle  Mild degenerative changes as described above. No acute abnormality seen.   UA worrisome for uti   Following Medications were ordered in ER: Medications  fosfomycin (MONUROL) packet 3 g (has no administration in time range)  predniSONE (DELTASONE) tablet 20 mg (20 mg Oral Given 10/04/22 1720)  acetaminophen-codeine (TYLENOL #3) 300-30 MG per tablet 1 tablet (1 tablet Oral Given 10/04/22 1719)    _________________    ED Triage Vitals  Enc Vitals Group     BP 10/04/22 1329 137/83     Pulse Rate 10/04/22 1329 79     Resp 10/04/22 1329 18     Temp 10/04/22 1329 98.9 F (37.2 C)     Temp Source 10/04/22 1329 Oral     SpO2 10/04/22 1329 97 %     Weight --      Height --      Head Circumference --      Peak Flow --      Pain Score 10/04/22 1324 7     Pain Loc --      Pain Edu? --      Excl. in GC? --   TMAX(24)@     _________________________________________ Significant initial  Findings: Abnormal Labs Reviewed  CBC WITH DIFFERENTIAL/PLATELET - Abnormal; Notable for the following components:      Result Value   Neutro Abs 1.6 (*)    All other components within normal limits  COMPREHENSIVE METABOLIC PANEL - Abnormal; Notable for the following components:   Glucose, Bld 118 (*)    Creatinine, Ser 1.18 (*)    Total Protein 9.0 (*)    Albumin 3.4 (*)    GFR, Estimated 47 (*)    All other components within normal limits  URINALYSIS, ROUTINE W REFLEX MICROSCOPIC - Abnormal; Notable for the following components:   Color, Urine AMBER (*)    APPearance HAZY (*)    Hgb urine dipstick MODERATE (*)    Nitrite POSITIVE (*)    Leukocytes,Ua MODERATE (*)    Bacteria, UA MANY (*)    All other components within normal limits  CBG MONITORING, ED - Abnormal; Notable for the following components:   Glucose-Capillary 111 (*)    All other components within normal limits  CBG MONITORING, ED - Abnormal; Notable for the following components:   Glucose-Capillary 124 (*)    All other components  within normal limits      ECG: Ordered     The recent clinical data is shown below. Vitals:   10/04/22 1329 10/04/22 1818 10/04/22 1826 10/04/22 2030  BP: 137/83 117/71 117/71 134/61  Pulse: 79 74 72 74  Resp: 18  11 14   Temp: 98.9 F (37.2 C)  97.7 F (36.5 C)   TempSrc: Oral  Oral   SpO2: 97% 96% 100% 98%     WBC     Component Value Date/Time   WBC 4.3 10/04/2022 1851   LYMPHSABS 2.0 10/04/2022 1851   MONOABS 0.5 10/04/2022 1851   EOSABS 0.2 10/04/2022 1851   BASOSABS 0.0 10/04/2022 1851     Procalcitonin   Ordered   UA  evidence of UTI     Urine analysis:    Component Value Date/Time   COLORURINE AMBER (A) 10/04/2022 2116   APPEARANCEUR HAZY (A) 10/04/2022 2116   LABSPEC 1.018 10/04/2022 2116   PHURINE 5.0 10/04/2022 2116   GLUCOSEU NEGATIVE 10/04/2022 2116   HGBUR MODERATE (A) 10/04/2022 2116   BILIRUBINUR NEGATIVE 10/04/2022 2116   KETONESUR NEGATIVE 10/04/2022 2116   PROTEINUR NEGATIVE 10/04/2022 2116   NITRITE POSITIVE (A) 10/04/2022 2116   LEUKOCYTESUR MODERATE (A) 10/04/2022 2116    Results for orders placed or performed during the hospital encounter of 08/19/22  Body fluid culture w Gram Stain     Status: None   Collection Time: 08/23/22  5:51 PM   Specimen: Synovial Fluid  Result Value Ref Range Status   Specimen Description FLUID  Final   Special Requests  RIGHT KNEE  Final   Gram Stain   Final    FEW WBC PRESENT, PREDOMINANTLY PMN NO ORGANISMS SEEN    Culture   Final    NO GROWTH 3 DAYS Performed at [facility], [address] [place], [place] [address]    Report Status 08/27/2022 FINAL  Final     _______________________________________________ Hospitalist was called for admission for   pain of right ankle, uti  The following Work up has been ordered so far:  Orders Placed This Encounter  Procedures   Urine Culture   DG Ankle Complete Right   CBC with Differential   Comprehensive metabolic panel   Urinalysis, Routine  w reflex microscopic Urine, In & Out Cath  Apply ace wrap   Consult to Transition of Care Team (SW and CM)   Consult to hospitalist   CBG monitoring, ED   POC CBG, ED     OTHER Significant initial  Findings:  labs showing:  Recent Labs  Lab 10/04/22 1851  NA 135  K 3.9  CO2 25  GLUCOSE 118*  BUN 22  CREATININE 1.18*  CALCIUM 9.5    Cr   stable,    Lab Results  Component Value Date   CREATININE 1.18 (H) 10/04/2022   CREATININE 0.99 09/13/2022   CREATININE 1.02 (H) 08/20/2022    Recent Labs  Lab 10/04/22 1851  AST 39  ALT 30  ALKPHOS 68  BILITOT 0.7  PROT 9.0*  ALBUMIN 3.4*   Lab Results  Component Value Date   CALCIUM 9.5 10/04/2022    Plt: Lab Results  Component Value Date   PLT 217 10/04/2022       Recent Labs  Lab 10/04/22 1851  WBC 4.3  NEUTROABS 1.6*  HGB 12.1  HCT 38.6  MCV 95.3  PLT 217    HG/HCT  stable,      Component Value Date/Time   HGB 12.1 10/04/2022 1851   HCT 38.6 10/04/2022 1851   MCV 95.3 10/04/2022 1851     DM  labs:  HbA1C: Recent Labs    08/09/22 1342  HGBA1C 7.5*    CBG (last 3)  Recent Labs    10/04/22 1512 10/04/22 1947  GLUCAP 111* 124*    Cultures:    Component Value Date/Time   SDES FLUID 08/23/2022 1751   SPECREQUEST  RIGHT KNEE 08/23/2022 1751   CULT  08/23/2022 1751    NO GROWTH 3 DAYS Performed at [facility], [place] [address] [place], [place] [address]    REPTSTATUS 08/27/2022 FINAL 08/23/2022 1751     Radiological Exams on Admission: DG Ankle Complete Right  Result Date: 10/04/2022 CLINICAL DATA:  Right ankle pain without known injury. History of gout. EXAM: RIGHT ANKLE - COMPLETE 3+ VIEW COMPARISON:  None Available. FINDINGS: There is no evidence of fracture, dislocation, or joint effusion. Mild lateral soft tissue swelling is noted. Mild anterior osteophyte formation is seen involving the talotibial joint. IMPRESSION: Mild degenerative changes as described above. No acute  abnormality seen. Electronically Signed   By: Marijo Conception M.D.   On: 10/04/2022 13:50   _______________________________________________________________________________________________________ Latest  Blood pressure 134/61, pulse 74, temperature 97.7 F (36.5 C), temperature source Oral, resp. rate 14, SpO2 98 %.   Vitals  labs and radiology finding personally reviewed  Review of Systems:    Pertinent positives include:  fatigue, right ankle pain  Constitutional:  No weight loss, night sweats, Fevers, chills,  weight loss  HEENT:  No headaches, Difficulty swallowing,Tooth/dental problems,Sore throat,  No sneezing, itching, ear ache, nasal congestion, post nasal drip,  Cardio-vascular:  No chest pain, Orthopnea, PND, anasarca, dizziness, palpitations.no Bilateral lower extremity swelling  GI:  No heartburn, indigestion, abdominal pain, nausea, vomiting, diarrhea, change in bowel habits, loss of appetite, melena, blood in stool, hematemesis Resp:  no shortness of breath at rest. No dyspnea on exertion, No excess mucus, no productive cough, No non-productive cough, No coughing up of blood.No change in color of mucus.No wheezing. Skin:  no rash or lesions. No jaundice GU:  no dysuria, change in color of urine, no urgency or frequency. No straining to urinate.  No flank pain.  Musculoskeletal:  No joint pain or no joint  swelling. No decreased range of motion. No back pain.  Psych:  No change in mood or affect. No depression or anxiety. No memory loss.  Neuro: no localizing neurological complaints, no tingling, no weakness, no double vision, no gait abnormality, no slurred speech, no confusion  All systems reviewed and apart from HOPI all are negative _______________________________________________________________________________________________ Past Medical History:   Past Medical History:  Diagnosis Date   Chronic kidney disease    Complication of anesthesia    "hard time  waking me up after foot surgery"   Depression    Diabetes (HCC)    type 2   Edema    Family history of kidney cancer    Family history of lung cancer    Family history of pancreatic cancer    Osteoarthritis of both knees    Sleep apnea    "years ago" does not wear CPAP anymmore    Past Surgical History:  Procedure Laterality Date   ABDOMINAL HYSTERECTOMY     FOOT SURGERY Left    TOTAL KNEE ARTHROPLASTY Left 08/19/2022   Procedure: LEFT TOTAL KNEE ARTHROPLASTY;  Surgeon: Cammy Copa, MD;  Location: [facility] OR;  Service: Orthopedics;  Laterality: Left;   TUBAL LIGATION      Social History:   Ambulatory  walker      reports that she has never smoked. She has never used smokeless tobacco. She reports that she does not drink alcohol and does not use drugs.    Family History:  Family History  Problem Relation Age of Onset   Diabetes Maternal Aunt    CVA Daughter    Kidney failure Son    Pancreatic cancer Father 34   Kidney cancer Half-Sister 32       non-smoker   Lung cancer Half-Sister 73       hx of smoking   Cancer Half-Sister 55       unknown primary   ______________________________________________________________________________________________ Allergies: Allergies  Allergen Reactions   Aspirin Other (See Comments)    Pt unsure   Clorazepate Dipotassium Other (See Comments)    "Can't function"   Ketoprofen Other (See Comments)    Pt unsure   Oxycodone-Aspirin     REACTION: tongue swells       Prior to Admission medications   Medication Sig Start Date End Date Taking? Authorizing Provider  ACCU-CHEK AVIVA PLUS test strip  07/25/18   [provider]  acetaminophen (TYLENOL) 650 MG CR tablet Take 650 mg by mouth every 8 (eight) hours as needed for pain.    [provider]  albuterol (PROVENTIL HFA;VENTOLIN HFA) 108 (90 Base) MCG/ACT inhaler Inhale 1-2 puffs into the lungs every 6 (six) hours as needed for wheezing or shortness of  breath. Patient not taking: Reported on 08/03/2022 01/24/18   Couture, Cortni S, PA-C  atorvastatin (LIPITOR) 80 MG tablet Take 80 mg by mouth daily.    [provider]  benzonatate (TESSALON) 100 MG capsule Take 1 capsule (100 mg total) by mouth every 8 (eight) hours. Patient not taking: Reported on 09/28/2018 01/24/18   Couture, Cortni S, PA-C  Blood Glucose Monitoring Suppl (TRUE METRIX METER) w/Device KIT  06/26/21   [provider]  colchicine 0.6 MG tablet Take 1 tablet (0.6 mg total) by mouth 2 (two) times daily for 5 days. 09/29/22 10/04/22  Magnant, Charles L, PA-C  docusate sodium (COLACE) 100 MG capsule Take 1 capsule (100 mg total) by mouth 2 (two) times daily. 08/26/22   Magnant,  Charles L, PA-C  escitalopram (LEXAPRO) 20 MG tablet Take 20 mg by mouth daily as needed (anxiety). Patient not taking: Reported on 08/12/2022 06/26/21   [provider]  estradiol (ESTRACE) 0.1 MG/GM vaginal cream Place 1 Applicatorful vaginally 2 (two) times a week. 06/26/21   [provider]  HYDROmorphone (DILAUDID) 2 MG tablet Take 0.5 tablets (1 mg total) by mouth every 4 (four) hours as needed for up to 12 doses for severe pain. 09/13/22   Sherrye Payor A, PA-C  Insulin Glargine (BASAGLAR KWIKPEN) 100 UNIT/ML Inject 28 Units into the skin daily as needed (blood glucose greater than 120).    [provider]  Lancets (ONETOUCH DELICA PLUS [id number]) [place]  09/21/18   [provider]  methocarbamol (ROBAXIN) 500 MG tablet Take 1 tablet (500 mg total) by mouth every 8 (eight) hours as needed for muscle spasms. 08/26/22   Magnant, Charles L, PA-C  MYRBETRIQ 25 MG TB24 tablet Take 25 mg by mouth daily. 03/04/21   [provider]  rivaroxaban (XARELTO) 10 MG TABS tablet Take 1 tablet (10 mg total) by mouth daily. 08/27/22   Magnant, Charles L, PA-C  TOVIAZ 4 MG TB24 tablet Take 4 mg by mouth daily. 07/17/18   [provider]  valsartan (DIOVAN) 80  MG tablet Take 80 mg by mouth daily. 08/01/18   [provider]  Vitamin D, Ergocalciferol, (DRISDOL) 1.25 MG (50000 UT) CAPS capsule Take 50,000 Units by mouth every 7 (seven) days.    [provider]    ___________________________________________________________________________________________________ Physical Exam:    10/04/2022    8:30 PM 10/04/2022    6:26 PM 10/04/2022    6:18 PM  Vitals with BMI  Systolic 962 229 798  Diastolic 61 71 71  Pulse 74 72 74    1. General:  in No  Acute distress   Chronically ill   -appearing 2. Psychological: Alert and   Oriented 3. Head/ENT:    Dry Mucous Membranes                          Head Non traumatic, neck supple                        Poor Dentition 4. SKIN:  decreased Skin turgor,  Skin clean Dry and intact no rash 5. Heart: Regular rate and rhythm no  Murmur, no Rub or gallop 6. Lungs:  Clear to auscultation bilaterally, no wheezes or crackles   7. Abdomen: Soft, suprapubic -tender, Non distended   obese  bowel sounds present 8. Lower extremities: no clubbing, cyanosis,   Right > left 9. Neurologically Grossly intact, moving all 4 extremities equally   10. MSK: Normal range of motion    Chart has been reviewed  ______________________________________________________________________________________________  Assessment/Plan 82 y.o. female with medical history significant of gout, sp left knee replacement, remote DVT, DM2, CKD   Admitted for  pain of right ankle, gout attack and UTI     Present on Admission:  Gout attack  UTI (urinary tract infection)  Debility DM2    Controlled type 2 diabetes mellitus without complication, without long-term current use of insulin (HCC) Order sliding scale, lantus at 20 uni ec qhs  Gout attack Pain control , orthopedics consult in Am   Start on prednisone 40 mg po q day  Check uric acid level  Colchicine 0.5 mg po BID  UTI (urinary tract infection) Pt  with hx of  multiple resistances  Order urine culture  For tonight cover with zosyn  Debility Will need pt ot assessment prior to dc   Other plan as per orders.  DVT prophylaxis:  SCD        Code Status:    Code Status: Prior FULL CODE  as per patient   I had personally discussed CODE STATUS with patient    Family Communication:   Family not at  Bedside    Disposition Plan:       To home once workup is complete and patient is stable   Following barriers for discharge:                            Electrolytes corrected                                                        Will need consultants to evaluate patient prior to discharge                       Would benefit from PT/OT eval prior to DC  Ordered                      Consults called: please consult orthopedics in AM   Admission status:  ED Disposition     ED Disposition  Admit   Condition  --   Comment  Hospital Area: [facility] [[id number]]  Level of Care: Med-Surg [[id number]]  May place patient in observation at [facility] or [facility] if equivalent level of care is available:: No  Covid Evaluation: Asymptomatic - no recent exposure (last 10 days) testing not required  Diagnosis: Gout attack [604540]  Admitting Physician: Therisa Doyne [[id number]]  Attending Physician: Therisa Doyne [[id number]]           Obs      Level of care     medical floor         Berdine Rasmusson 10/05/2022, 12:12 AM    [facility]     after 2 AM please page floor coverage PA If 7AM-7PM, please contact the day team taking care of the patient using [email or web address]   Patient was evaluated in the context of the global COVID-19 pandemic, which necessitated consideration that the patient might be at risk for infection with the SARS-CoV-2 virus that causes COVID-19. Institutional protocols and algorithms that pertain to the evaluation of patients at risk for COVID-19 are in a state of rapid change based on information released  by regulatory bodies including the CDC and federal and state organizations. These policies and algorithms were followed during the patient's care.

## 2022-10-04 NOTE — Care Management (Signed)
Transition of Care [facility]) - Emergency Department Mini Assessment   Patient Details  Name: MACKENSEY BOLTE MRN: [id number] Date of Birth: 04-Nov-1940  Transition of Care [facility]) CM/SW Contact:    Roseanne Kaufman, RN Phone Number: 10/04/2022, 6:54 PM   Clinical Narrative: This RNCM spoke with Stanton Kidney at [facility] who reports patient was discharged from their short term SNF on 09/24/22.  TOC will continue to follow.   ED Mini Assessment:    Barriers to Discharge: Continued Medical Work up             Patient Contact and Communications        ,                 Admission diagnosis:  right leg pain;gout Patient Active Problem List   Diagnosis Date Noted   Arthritis of left knee 08/21/2022   OA (osteoarthritis) of knee 08/19/2022   S/P TKR (total knee replacement), left 08/19/2022   Controlled type 2 diabetes mellitus without complication, without long-term current use of insulin ([place]) 07/31/2021   Genetic testing 03/14/2020   Family history of pancreatic cancer    Family history of kidney cancer    Family history of lung cancer    PCP:  [name], Fransico Him, MD Pharmacy:   [facility] [place], [place] - [address] [place] [address] Phone: [phone] Fax: [phone]

## 2022-10-05 ENCOUNTER — Encounter (HOSPITAL_COMMUNITY): Payer: Self-pay | Admitting: Internal Medicine

## 2022-10-05 DIAGNOSIS — N3000 Acute cystitis without hematuria: Secondary | ICD-10-CM | POA: Diagnosis not present

## 2022-10-05 DIAGNOSIS — R5381 Other malaise: Secondary | ICD-10-CM | POA: Diagnosis not present

## 2022-10-05 DIAGNOSIS — E119 Type 2 diabetes mellitus without complications: Secondary | ICD-10-CM | POA: Diagnosis not present

## 2022-10-05 DIAGNOSIS — M109 Gout, unspecified: Secondary | ICD-10-CM | POA: Diagnosis not present

## 2022-10-05 LAB — CBC
HCT: 40.9 % (ref 36.0–46.0)
Hemoglobin: 12.8 g/dL (ref 12.0–15.0)
MCH: 29.8 pg (ref 26.0–34.0)
MCHC: 31.3 g/dL (ref 30.0–36.0)
MCV: 95.3 fL (ref 80.0–100.0)
Platelets: 205 10*3/uL (ref 150–400)
RBC: 4.29 MIL/uL (ref 3.87–5.11)
RDW: 15 % (ref 11.5–15.5)
WBC: 3.2 10*3/uL — ABNORMAL LOW (ref 4.0–10.5)
nRBC: 0 % (ref 0.0–0.2)

## 2022-10-05 LAB — COMPREHENSIVE METABOLIC PANEL
ALT: 30 U/L (ref 0–44)
AST: 37 U/L (ref 15–41)
Albumin: 3.2 g/dL — ABNORMAL LOW (ref 3.5–5.0)
Alkaline Phosphatase: 61 U/L (ref 38–126)
Anion gap: 11 (ref 5–15)
BUN: 23 mg/dL (ref 8–23)
CO2: 23 mmol/L (ref 22–32)
Calcium: 8.9 mg/dL (ref 8.9–10.3)
Chloride: 104 mmol/L (ref 98–111)
Creatinine, Ser: 1.14 mg/dL — ABNORMAL HIGH (ref 0.44–1.00)
GFR, Estimated: 49 mL/min — ABNORMAL LOW (ref >60)
Glucose, Bld: 143 mg/dL — ABNORMAL HIGH (ref 70–99)
Potassium: 4.2 mmol/L (ref 3.5–5.1)
Sodium: 138 mmol/L (ref 135–145)
Total Bilirubin: 0.7 mg/dL (ref 0.3–1.2)
Total Protein: 8.3 g/dL — ABNORMAL HIGH (ref 6.5–8.1)

## 2022-10-05 LAB — CBG MONITORING, ED: Glucose-Capillary: 117 mg/dL — ABNORMAL HIGH (ref 70–99)

## 2022-10-05 LAB — TSH: TSH: 1.591 u[IU]/mL (ref 0.350–4.500)

## 2022-10-05 LAB — URIC ACID: Uric Acid, Serum: 5.7 mg/dL (ref 2.5–7.1)

## 2022-10-05 LAB — PREALBUMIN: Prealbumin: 9 mg/dL — ABNORMAL LOW (ref 18–38)

## 2022-10-05 LAB — PHOSPHORUS: Phosphorus: 3.7 mg/dL (ref 2.5–4.6)

## 2022-10-05 LAB — HEMOGLOBIN A1C
Hgb A1c MFr Bld: 6.3 % — ABNORMAL HIGH (ref 4.8–5.6)
Mean Plasma Glucose: 134.11 mg/dL

## 2022-10-05 LAB — CREATININE, URINE, RANDOM: Creatinine, Urine: 228 mg/dL

## 2022-10-05 LAB — GLUCOSE, CAPILLARY
Glucose-Capillary: 208 mg/dL — ABNORMAL HIGH (ref 70–99)
Glucose-Capillary: 167 mg/dL — ABNORMAL HIGH (ref 70–99)

## 2022-10-05 LAB — MAGNESIUM: Magnesium: 2 mg/dL (ref 1.7–2.4)

## 2022-10-05 MED ORDER — INSULIN GLARGINE-YFGN 100 UNIT/ML ~~LOC~~ SOLN
20.0000 [IU] | Freq: Every day | SUBCUTANEOUS | Status: DC
  Administered 2022-10-06 – 2022-10-07 (×2): 20 [IU] via SUBCUTANEOUS
  Filled 2022-10-05 (×3): qty 0.2

## 2022-10-05 MED ORDER — ACETAMINOPHEN 325 MG PO TABS: 650.0000 mg | ORAL_TABLET | Freq: Four times a day (QID) | ORAL | Status: DC | PRN

## 2022-10-05 MED ORDER — METHOCARBAMOL 500 MG PO TABS: 500.0000 mg | ORAL_TABLET | Freq: Three times a day (TID) | ORAL | Status: DC | PRN

## 2022-10-05 MED ORDER — HYDROCODONE-ACETAMINOPHEN 5-325 MG PO TABS
1.0000 | ORAL_TABLET | ORAL | Status: DC | PRN
  Administered 2022-10-05: 2 via ORAL
  Administered 2022-10-06: 1 via ORAL
  Filled 2022-10-05: qty 1
  Filled 2022-10-05: qty 2

## 2022-10-05 MED ORDER — PREDNISONE 20 MG PO TABS
40.0000 mg | ORAL_TABLET | Freq: Every day | ORAL | Status: AC
  Administered 2022-10-05 – 2022-10-09 (×5): 40 mg via ORAL
  Filled 2022-10-05 (×5): qty 2

## 2022-10-05 MED ORDER — ATORVASTATIN CALCIUM 40 MG PO TABS
80.0000 mg | ORAL_TABLET | Freq: Every day | ORAL | Status: DC
  Administered 2022-10-05 – 2022-10-12 (×8): 80 mg via ORAL
  Filled 2022-10-05 (×8): qty 2

## 2022-10-05 MED ORDER — ENOXAPARIN SODIUM 40 MG/0.4ML IJ SOSY
40.0000 mg | PREFILLED_SYRINGE | INTRAMUSCULAR | Status: DC
  Administered 2022-10-05 – 2022-10-11 (×7): 40 mg via SUBCUTANEOUS
  Filled 2022-10-05 (×7): qty 0.4

## 2022-10-05 MED ORDER — BASAGLAR KWIKPEN 100 UNIT/ML ~~LOC~~ SOPN: 20.0000 [IU] | PEN_INJECTOR | Freq: Every day | SUBCUTANEOUS | Status: DC | PRN

## 2022-10-05 MED ORDER — SODIUM CHLORIDE 0.9 % IV SOLN: INTRAVENOUS | Status: AC

## 2022-10-05 MED ORDER — POLYETHYLENE GLYCOL 3350 17 G PO PACK
17.0000 g | PACK | Freq: Every day | ORAL | Status: DC
  Administered 2022-10-05 – 2022-10-06 (×2): 17 g via ORAL
  Filled 2022-10-05 (×7): qty 1

## 2022-10-05 MED ORDER — ACETAMINOPHEN 650 MG RE SUPP: 650.0000 mg | Freq: Four times a day (QID) | RECTAL | Status: DC | PRN

## 2022-10-05 MED ORDER — COLCHICINE 0.6 MG PO TABS
0.6000 mg | ORAL_TABLET | Freq: Two times a day (BID) | ORAL | Status: DC
  Administered 2022-10-05 – 2022-10-10 (×13): 0.6 mg via ORAL
  Filled 2022-10-05 (×14): qty 1

## 2022-10-05 MED ORDER — HYDROMORPHONE HCL 2 MG PO TABS
1.0000 mg | ORAL_TABLET | ORAL | Status: DC | PRN
  Administered 2022-10-11: 1 mg via ORAL
  Filled 2022-10-05: qty 1

## 2022-10-05 MED ORDER — ALBUTEROL SULFATE (2.5 MG/3ML) 0.083% IN NEBU: 2.5000 mg | INHALATION_SOLUTION | RESPIRATORY_TRACT | Status: DC | PRN

## 2022-10-05 NOTE — Evaluation (Signed)
Physical Therapy Evaluation Patient Details Name: Mckenzie Rogers MRN: [id number] DOB: 04/11/1941 Today's Date: 10/05/2022  History of Present Illness  Patient is a 82 year old female who Developed severe gout in her right ankle unable to bare weight  PMHx: OA (b knees), DM, L TKA 12/7  Clinical Impression  Pt admitted with above diagnosis. Pt from home, recently d/c home from SNF after L TKA and had daughter assisting as needed due to increasing R ankle pain. Pt able to move R ankle with ace bandage in plantarflexion, lacking full dorsiflexion and reporting pain. Pt needing assist with bed mobility. Pt declines transfers due to high pain, also on elevated gurney and afraid she will fall off gurney with transfer. Recommend ST SNF prior to return home due to pain and assistance level required; pt reports daughter has had multiple strokes but may be able to physically assist her. Pt currently with functional limitations due to the deficits listed below (see PT Problem List). Pt will benefit from skilled PT to increase their independence and safety with mobility to allow discharge to the venue listed below.          Recommendations for follow up therapy are one component of a multi-disciplinary discharge planning process, led by the attending physician.  Recommendations may be updated based on patient status, additional functional criteria and insurance authorization.  Follow Up Recommendations Skilled nursing-short term rehab (<3 hours/day) Can patient physically be transported by private vehicle: No    Assistance Recommended at Discharge Frequent or constant Supervision/Assistance  Patient can return home with the following  A lot of help with walking and/or transfers;A little help with bathing/dressing/bathroom;Assistance with cooking/housework;Assist for transportation;Help with stairs or ramp for entrance    Equipment Recommendations Rolling walker (2 wheels)  Recommendations for Other  Services       Functional Status Assessment Patient has had a recent decline in their functional status and demonstrates the ability to make significant improvements in function in a reasonable and predictable amount of time.     Precautions / Restrictions Precautions Precautions: Fall Precaution Comments: recent TKA 12/7 LLE Restrictions Weight Bearing Restrictions: No      Mobility  Bed Mobility Overal bed mobility: Needs Assistance Bed Mobility: Supine to Sit, Sit to Supine  Supine to sit: Min assist, +2 for safety/equipment Sit to supine: Min guard, +2 for safety/equipment   General bed mobility comments: with increased time, assist for trunk uprighting into sitting    Transfers  General transfer comment: pt declines due to pain    Ambulation/Gait  General Gait Details: pt declines due to pain  Stairs            Wheelchair Mobility    Modified Rankin (Stroke Patients Only)       Balance Overall balance assessment: Needs assistance Sitting-balance support: Feet unsupported, Single extremity supported Sitting balance-Leahy Scale: Fair       Pertinent Vitals/Pain Pain Assessment Pain Assessment: Faces Faces Pain Scale: Hurts whole lot Pain Location: R ankle and L knee Pain Descriptors / Indicators: Discomfort, Grimacing, Guarding Pain Intervention(s): Limited activity within patient's tolerance, Monitored during session, Premedicated before session    Home Living Family/patient expects to be discharged to:: Private residence Living Arrangements: Children Available Help at Discharge: Family;Available 24 hours/day Type of Home: House Home Access: Stairs to enter Entrance Stairs-Rails: Can reach both Entrance Stairs-Number of Steps: 2   Home Layout: One level Home Equipment: BSC/3in1 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  Prior Function Prior Level of Function : Independent/Modified Independent      Hand Dominance   Dominant  Hand: Right    Extremity/Trunk Assessment   Upper Extremity Assessment Upper Extremity Assessment: Defer to OT evaluation RUE Deficits / Details: ROM about 70 degrees of FF and ABDuction LUE Deficits / Details: ROM about 70 degrees of FF and ABDuction    Lower Extremity Assessment Lower Extremity Assessment: RLE deficits/detail;LLE deficits/detail RLE Deficits / Details: ankle AROM dorsiflexion ~50%, knee extension 3+/5, hip abduction 3+/5, knee adduction 3+/5 RLE Sensation: WNL RLE Coordination: WNL LLE Deficits / Details: AROM WFL, knee extension 3+/5, hip abduction 3+/5, knee adduction 3+/5 LLE Sensation: WNL LLE Coordination: WNL    Cervical / Trunk Assessment Cervical / Trunk Assessment: Normal  Communication   Communication: No difficulties  Cognition Arousal/Alertness: Awake/alert Behavior During Therapy: WFL for tasks assessed/performed Overall Cognitive Status: Within Functional Limits for tasks assessed   General Comments: pt pleasant        General Comments      Exercises     Assessment/Plan    PT Assessment Patient needs continued PT services  PT Problem List Decreased strength;Decreased range of motion;Decreased activity tolerance;Decreased balance;Decreased mobility;Pain;Obesity       PT Treatment Interventions DME instruction;Gait training;Functional mobility training;Therapeutic activities;Therapeutic exercise;Balance training;Neuromuscular re-education;Stair training;Patient/family education    PT Goals (Current goals can be found in the Care Plan section)  Acute Rehab PT Goals Patient Stated Goal: return home with family assisting PT Goal Formulation: With patient Time For Goal Achievement: 10/19/22 Potential to Achieve Goals: Good    Frequency Min 3X/week     Co-evaluation PT/OT/SLP Co-Evaluation/Treatment: Yes Reason for Co-Treatment: For patient/therapist safety;To address functional/ADL transfers PT goals addressed during session:  Mobility/safety with mobility;Proper use of DME;Balance OT goals addressed during session: ADL's and self-care       AM-PAC PT "6 Clicks" Mobility  Outcome Measure Help needed turning from your back to your side while in a flat bed without using bedrails?: A Little Help needed moving from lying on your back to sitting on the side of a flat bed without using bedrails?: A Little Help needed moving to and from a bed to a chair (including a wheelchair)?: Total Help needed standing up from a chair using your arms (e.g., wheelchair or bedside chair)?: Total Help needed to walk in hospital room?: Total Help needed climbing 3-5 steps with a railing? : Total 6 Click Score: 10    End of Session   Activity Tolerance: Patient tolerated treatment well Patient left: in bed;with call bell/phone within reach Nurse Communication: Mobility status PT Visit Diagnosis: Muscle weakness (generalized) (M62.81);Pain Pain - Right/Left: Right Pain - part of body: Ankle and joints of foot    Time: 4496-7591 PT Time Calculation (min) (ACUTE ONLY): 24 min   Charges:   PT Evaluation $PT Eval Moderate Complexity: 1 Mod           Tori Semisi Biela PT, DPT 10/05/22, 4:14 PM

## 2022-10-05 NOTE — Evaluation (Signed)
Occupational Therapy Treatment Patient Details Name: Mckenzie Rogers MRN: [id number] DOB: [birth year] Today's Date: 10/05/2022   History of present illness Patient is a 82 year old female who Developed severe gout in her right ankle unable to bare weight  PMHx: OA (b knees), DM, L TKA 12/7   OT comments  Patient is a 82 year old female who presented to the ED with above. Patient was living at home with daughter after recent d/c from SNF after knee surgery. Patient was noted to have increased pain, decreased functional activity tolerance, decreased endurance, decreased standing balance, decreased safety awareness, and decreased knowledge of AD/AE impacting participation in ADLs. Patient would continue to benefit from skilled OT services at this time while admitted and after d/c to address noted deficits in order to improve overall safety and independence in ADLs.      Recommendations for follow up therapy are one component of a multi-disciplinary discharge planning process, led by the attending physician.  Recommendations may be updated based on patient status, additional functional criteria and insurance authorization.    Follow Up Recommendations  Skilled nursing-short term rehab (<3 hours/day)     Assistance Recommended at Discharge Frequent or constant Supervision/Assistance  Patient can return home with the following  A lot of help with walking and/or transfers;A lot of help with bathing/dressing/bathroom;Assistance with cooking/housework;Direct supervision/assist for financial management;Assist for transportation;Help with stairs or ramp for entrance;Direct supervision/assist for medications management   Equipment Recommendations  Other (comment) (patient reported having all needed AE at home)    Recommendations for Other Services      Precautions / Restrictions Precautions Precautions: Fall Precaution Comments: recent TKA 12/7 LLE       Mobility Bed Mobility Overal bed  mobility: Needs Assistance Bed Mobility: Supine to Sit, Sit to Supine     Supine to sit: Min assist, +2 for safety/equipment Sit to supine: Min guard, +2 for safety/equipment   General bed mobility comments: with increased time       Balance Overall balance assessment: Needs assistance Sitting-balance support: Feet unsupported, Single extremity supported Sitting balance-Leahy Scale: Fair         ADL either performed or assessed with clinical judgement   ADL Overall ADL's : Needs assistance/impaired Eating/Feeding: Set up;Sitting   Grooming: Oral care;Bed level Grooming Details (indicate cue type and reason): MI once supplied items needed. Upper Body Bathing: Bed level;Set up   Lower Body Bathing: Bed level;Total assistance   Upper Body Dressing : Sitting;Set up   Lower Body Dressing: Bed level;Total assistance Lower Body Dressing Details (indicate cue type and reason): to don gripper socks and slippers.   Toilet Transfer Details (indicate cue type and reason): patient transitioned to EOB in ED but declined to attempt to stand with increased pain with BLE in dependent positioning. Toileting- Clothing Manipulation and Hygiene: Total assistance;Bed level              Extremity/Trunk Assessment Upper Extremity Assessment Upper Extremity Assessment: RUE deficits/detail;LUE deficits/detail RUE Deficits / Details: ROM about 70 degrees of FF and ABDuction LUE Deficits / Details: ROM about 70 degrees of FF and ABDuction   Lower Extremity Assessment Lower Extremity Assessment: Defer to PT evaluation         Cognition Arousal/Alertness: Awake/alert Behavior During Therapy: WFL for tasks assessed/performed Overall Cognitive Status: Within Functional Limits for tasks assessed  General Comments: plesant and cooperative                   Pertinent Vitals/ Pain       Pain Assessment Pain Assessment: Faces Faces Pain  Scale: Hurts whole lot Pain Location: R ankle and L knee Pain Descriptors / Indicators: Discomfort, Grimacing, Guarding Pain Intervention(s): Limited activity within patient's tolerance, Monitored during session, Premedicated before session  Home Living Family/patient expects to be discharged to:: Private residence Living Arrangements: Children Available Help at Discharge: Family;Available 24 hours/day Type of Home: House Home Access: Stairs to enter [employer] of Steps: 2 Entrance Stairs-Rails: Can reach both Home Layout: One level     Bathroom Shower/Tub: Teacher, early years/pre: Standard     Home Equipment: None   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      Prior Functioning/Environment     Patient reported being independent at home         Frequency  Min 2X/week        Progress Toward Goals  OT Goals(current goals can now be found in the care plan section)     Acute Rehab OT Goals Patient Stated Goal: to go home OT Goal Formulation: With patient Time For Goal Achievement: 10/19/22 Potential to Achieve Goals: Fair  Plan      Co-evaluation    PT/OT/SLP Co-Evaluation/Treatment: Yes Reason for Co-Treatment: For patient/therapist safety;To address functional/ADL transfers PT goals addressed during session: Mobility/safety with mobility OT goals addressed during session: ADL's and self-care      AM-PAC OT "6 Clicks" Daily Activity     Outcome Measure   Help from another person eating meals?: A Little Help from another person taking care of personal grooming?: A Little Help from another person toileting, which includes using toliet, bedpan, or urinal?: A Lot Help from another person bathing (including washing, rinsing, drying)?: A Lot Help from another person to put on and taking off regular upper body clothing?: A Little Help from another person to put on and taking off regular lower body clothing?: A Lot 6 Click  Score: 15    End of Session    OT Visit Diagnosis: Unsteadiness on feet (R26.81);Other abnormalities of gait and mobility (R26.89);Pain   Activity Tolerance Patient limited by pain   Patient Left in bed;with call bell/phone within reach (in ED)   Nurse Communication Mobility status        Time: 9485-4627 OT Time Calculation (min): 24 min  Charges: OT General Charges $OT Visit: 1 Visit OT Evaluation $OT Eval Moderate Complexity: 1 Mod  Dangelo Guzzetta OTR/L, MS Acute Rehabilitation Department Office# [phone]   Willa Rough 10/05/2022, 3:53 PM

## 2022-10-05 NOTE — Progress Notes (Signed)
PROGRESS NOTE    Mckenzie Rogers  IDP:[id number] DOB: 1941/09/06 DOA: 10/04/2022 PCP: Haywood Pao, MD   Brief Narrative: Mckenzie Rogers is a 82 y.o. female with a history of gout, history of left knee replacement, remote DVT, diabetes mellitus type 2, CKD 2. Patient presented secondary to right ankle swelling and inability to bear weight. Patient started on prednisone and colchicine. PT/OT ordered. While admitted, patient found to have a UTI with history of ESBL.   Assessment and Plan:  Gout attack Acute on chronic, affecting right ankle. Patient manages at home on colchicine PRN, with no maintenance medication. Started on prednisone and colchicine this admission. Patient already with improvement of symptoms -Continue Colchicine -Continue prednisone, expect ability to discontinue shortly  UTI Patient with dysuria and urinalysis suggestive of acute infection. Patient started on Zosyn IV secondary to history of ESBL E. Coli. No concern for bacteremia at this time. -Continue Zosyn IV  Diabetes mellitus type 2 Patient is on Basaglar 28 units daily as needed. Patient started on Semglee 20 units daily and SSI while inpatient. -Continue Semglee 20 units and SSI  Overactive bladder Patient is on H[place]dal and Myrbetriq as an outpatient.  Hyperlipidemia -Continue Lipitor  Debility Patient is s/p left total knee arthroplasty. She was previously on Xarelto for DVT prophylaxis which is now discontinued.   DVT prophylaxis: Lovenox Code Status:   Code Status: Full Code Family Communication: None at bedside Disposition Plan: Discharge pending improvement of ability to ambulate on foot and urine culture results with possible transition to oral antibiotics   Consultants:  None  Procedures:  None  Antimicrobials: Zosyn IV    Subjective: Patient reports some improvement in right ankle pain. Still with dysuria. Lives at home with family. Recently discharged from rehab  for left knee replacement.   Objective: BP 124/69   Pulse (!) 56   Temp 97.6 F (36.4 C) (Oral)   Resp 13   Wt 81.6 kg   LMP  (LMP Unknown)   SpO2 98%   BMI 31.87 kg/m   Examination:  General exam: Appears calm and comfortable Respiratory system: Clear to auscultation. Respiratory effort normal. Cardiovascular system: S1 & S2 heard, RRR. No murmurs, rubs, gallops or clicks. Gastrointestinal system: Abdomen is nondistended, soft and nontender. Normal bowel sounds heard. Central nervous system: Alert and oriented. No focal neurological deficits. Musculoskeletal: No edema. No calf tenderness. Right ankle tenderness, mostly medially. Skin: No cyanosis. No rashes Psychiatry: Judgement and insight appear normal. Mood & affect appropriate.    Data Reviewed: I have personally reviewed following labs and imaging studies  CBC Lab Results  Component Value Date   WBC 4.3 10/04/2022   RBC 4.05 10/04/2022   HGB 12.1 10/04/2022   HCT 38.6 10/04/2022   MCV 95.3 10/04/2022   MCH 29.9 10/04/2022   PLT 217 10/04/2022   MCHC 31.3 10/04/2022   RDW 15.2 10/04/2022   LYMPHSABS 2.0 10/04/2022   MONOABS 0.5 10/04/2022   EOSABS 0.2 10/04/2022   BASOSABS 0.0 44/31/5400     Last metabolic panel Lab Results  Component Value Date   NA 135 10/04/2022   K 3.9 10/04/2022   CL 100 10/04/2022   CO2 25 10/04/2022   BUN 22 10/04/2022   CREATININE 1.18 (H) 10/04/2022   GLUCOSE 118 (H) 10/04/2022   GFRNONAA 47 (L) 10/04/2022   CALCIUM 9.5 10/04/2022   PROT 9.0 (H) 10/04/2022   ALBUMIN 3.4 (L) 10/04/2022   BILITOT 0.7 10/04/2022   ALKPHOS  68 10/04/2022   AST 39 10/04/2022   ALT 30 10/04/2022   ANIONGAP 10 10/04/2022    GFR: Estimated Creatinine Clearance: 39.1 mL/min (A) (by C-G formula based on SCr of 1.18 mg/dL (H)).  No results found for this or any previous visit (from the past 240 hour(s)).    Radiology Studies: DG Ankle Complete Right  Result Date: 10/04/2022 CLINICAL DATA:   Right ankle pain without known injury. History of gout. EXAM: RIGHT ANKLE - COMPLETE 3+ VIEW COMPARISON:  None Available. FINDINGS: There is no evidence of fracture, dislocation, or joint effusion. Mild lateral soft tissue swelling is noted. Mild anterior osteophyte formation is seen involving the talotibial joint. IMPRESSION: Mild degenerative changes as described above. No acute abnormality seen. Electronically Signed   By: Marijo Conception M.D.   On: 10/04/2022 13:50      LOS: 0 days    Cordelia Poche, MD [facility] 10/05/2022, 7:38 AM   If 7PM-7AM, please contact night-coverage [email or web address]

## 2022-10-05 NOTE — Hospital Course (Signed)
Mckenzie Rogers is a 82 y.o. female with a history of gout, history of left knee replacement, remote DVT, diabetes mellitus type 2, CKD 2. Patient presented secondary to right ankle swelling and inability to bear weight. Patient started on prednisone and colchicine. PT/OT ordered. While admitted, patient found to have a UTI with history of ESBL.

## 2022-10-05 NOTE — ED Notes (Signed)
ED TO INPATIENT HANDOFF REPORT  ED Nurse Name and Phone #: Kathlene November  [phone]  S Name/Age/Gender Mckenzie Rogers 82 y.o. female Room/Bed: WA16/WA16  Code Status   Code Status: Full Code  Home/SNF/Other Skilled nursing facility Patient oriented to: self, place, time, and situation Is this baseline? Yes   Triage Complete: Triage complete  Chief Complaint Gout attack [M10.9]  Triage Note BIBA from [facility] for c/o right ankle/foot pain 3-4 weeks. Seen on 1/1 for same.  Hx gout.  Swelling per ems. Pain increases when bearing weight.    Allergies Allergies  Allergen Reactions   Aspirin Other (See Comments)    Pt unsure   Clorazepate Dipotassium Other (See Comments)    "Can't function"   Ketoprofen Other (See Comments)    Pt unsure   Oxycodone-Aspirin     REACTION: tongue swells      Level of Care/Admitting Diagnosis ED Disposition     ED Disposition  Admit   Condition  --   Comment  Hospital Area: [facility] [[id number]]  Level of Care: Med-Surg [16]  May place patient in observation at [facility] or [facility] if equivalent level of care is available:: No  Covid Evaluation: Asymptomatic - no recent exposure (last 10 days) testing not required  Diagnosis: Gout attack [734193]  Admitting Physician: Therisa Doyne [[id number]]  Attending Physician: Therisa Doyne [[id number]]          B Medical/Surgery History Past Medical History:  Diagnosis Date   Chronic kidney disease    Complication of anesthesia    "hard time waking me up after foot surgery"   Depression    Diabetes (HCC)    type 2   Edema    Family history of kidney cancer    Family history of lung cancer    Family history of pancreatic cancer    Osteoarthritis of both knees    Sleep apnea    "years ago" does not wear CPAP anymmore   Past Surgical History:  Procedure Laterality Date   ABDOMINAL HYSTERECTOMY     FOOT SURGERY Left    TOTAL KNEE ARTHROPLASTY Left 08/19/2022    Procedure: LEFT TOTAL KNEE ARTHROPLASTY;  Surgeon: Cammy Copa, MD;  Location: [facility] OR;  Service: Orthopedics;  Laterality: Left;   TUBAL LIGATION       A IV Location/Drains/Wounds Patient Lines/Drains/Airways Status     Active Line/Drains/Airways     Name Placement date Placement time Site Days   Peripheral IV 10/04/22 20 G Right Antecubital 10/04/22  2200  Antecubital  1            Intake/Output Last 24 hours No intake or output data in the 24 hours ending 10/05/22 1344  Labs/Imaging Results for orders placed or performed during the hospital encounter of 10/04/22 (from the past 48 hour(s))  CBG monitoring, ED     Status: Abnormal   Collection Time: 10/04/22  3:12 PM  Result Value Ref Range   Glucose-Capillary 111 (H) 70 - 99 mg/dL    Comment: Glucose reference range applies only to samples taken after fasting for at least 8 hours.  CBC with Differential     Status: Abnormal   Collection Time: 10/04/22  6:51 PM  Result Value Ref Range   WBC 4.3 4.0 - 10.5 K/uL   RBC 4.05 3.87 - 5.11 MIL/uL   Hemoglobin 12.1 12.0 - 15.0 g/dL   HCT 79.0 24.0 - 97.3 %   MCV 95.3 80.0 - 100.0  fL   MCH 29.9 26.0 - 34.0 pg   MCHC 31.3 30.0 - 36.0 g/dL   RDW 15.2 11.5 - 15.5 %   Platelets 217 150 - 400 K/uL   nRBC 0.0 0.0 - 0.2 %   Neutrophils Relative % 37 %   Neutro Abs 1.6 (L) 1.7 - 7.7 K/uL   Lymphocytes Relative 47 %   Lymphs Abs 2.0 0.7 - 4.0 K/uL   Monocytes Relative 11 %   Monocytes Absolute 0.5 0.1 - 1.0 K/uL   Eosinophils Relative 4 %   Eosinophils Absolute 0.2 0.0 - 0.5 K/uL   Basophils Relative 1 %   Basophils Absolute 0.0 0.0 - 0.1 K/uL   Immature Granulocytes 0 %   Abs Immature Granulocytes 0.01 0.00 - 0.07 K/uL    Comment: Performed at [facility], [place] [address], [place], [place] [address]  Comprehensive metabolic panel     Status: Abnormal   Collection Time: 10/04/22  6:51 PM  Result Value Ref Range   Sodium 135 135 - 145 mmol/L    Potassium 3.9 3.5 - 5.1 mmol/L   Chloride 100 98 - 111 mmol/L   CO2 25 22 - 32 mmol/L   Glucose, Bld 118 (H) 70 - 99 mg/dL    Comment: Glucose reference range applies only to samples taken after fasting for at least 8 hours.   BUN 22 8 - 23 mg/dL   Creatinine, Ser 1.18 (H) 0.44 - 1.00 mg/dL   Calcium 9.5 8.9 - 10.3 mg/dL   Total Protein 9.0 (H) 6.5 - 8.1 g/dL   Albumin 3.4 (L) 3.5 - 5.0 g/dL   AST 39 15 - 41 U/L   ALT 30 0 - 44 U/L   Alkaline Phosphatase 68 38 - 126 U/L   Total Bilirubin 0.7 0.3 - 1.2 mg/dL   GFR, Estimated 47 (L) >60 mL/min    Comment: (NOTE) Calculated using the CKD-EPI Creatinine Equation (2021)    Anion gap 10 5 - 15    Comment: Performed at [facility], [place] [address] [place], [place] [address]  Hemoglobin A1c     Status: Abnormal   Collection Time: 10/04/22  6:51 PM  Result Value Ref Range   Hgb A1c MFr Bld 6.3 (H) 4.8 - 5.6 %    Comment: (NOTE) Pre diabetes:          5.7%-6.4%  Diabetes:              >6.4%  Glycemic control for   <7.0% adults with diabetes    Mean Plasma Glucose 134.11 mg/dL    Comment: Performed at [facility], [place] [address] [place], [place] [address]  POC CBG, ED     Status: Abnormal   Collection Time: 10/04/22  7:47 PM  Result Value Ref Range   Glucose-Capillary 124 (H) 70 - 99 mg/dL    Comment: Glucose reference range applies only to samples taken after fasting for at least 8 hours.  Urinalysis, Routine w reflex microscopic Urine, In & Out Cath     Status: Abnormal   Collection Time: 10/04/22  9:16 PM  Result Value Ref Range   Color, Urine AMBER (A) YELLOW    Comment: BIOCHEMICALS MAY BE AFFECTED BY COLOR   APPearance HAZY (A) CLEAR   Specific Gravity, Urine 1.018 1.005 - 1.030   pH 5.0 5.0 - 8.0   Glucose, UA NEGATIVE NEGATIVE mg/dL   Hgb urine dipstick MODERATE (A) NEGATIVE  Bilirubin Urine NEGATIVE NEGATIVE   Ketones, ur NEGATIVE NEGATIVE mg/dL   Protein, ur NEGATIVE NEGATIVE mg/dL    Nitrite POSITIVE (A) NEGATIVE   Leukocytes,Ua MODERATE (A) NEGATIVE   RBC / HPF 0-5 0 - 5 RBC/hpf   WBC, UA 21-50 0 - 5 WBC/hpf   Bacteria, UA MANY (A) NONE SEEN   Squamous Epithelial / HPF 0-5 0 - 5 /HPF   Mucus PRESENT    Hyaline Casts, UA PRESENT     Comment: Performed at [facility], [place] [address] [place], [place] [address]  Creatinine, urine, random     Status: None   Collection Time: 10/04/22  9:16 PM  Result Value Ref Range   Creatinine, Urine 228 mg/dL    Comment: Performed at [facility], [place] [address] [place], [place] [address]  CBG monitoring, ED     Status: Abnormal   Collection Time: 10/04/22 11:43 PM  Result Value Ref Range   Glucose-Capillary 221 (H) 70 - 99 mg/dL    Comment: Glucose reference range applies only to samples taken after fasting for at least 8 hours.  Prealbumin     Status: Abnormal   Collection Time: 10/05/22  5:16 AM  Result Value Ref Range   Prealbumin 9 (L) 18 - 38 mg/dL    Comment: Performed at [place] [address], [place], [place] [address]  TSH     Status: None   Collection Time: 10/05/22  5:16 AM  Result Value Ref Range   TSH 1.591 0.350 - 4.500 uIU/mL    Comment: Performed by a 3rd Generation assay with a functional sensitivity of <=0.01 uIU/mL. Performed at [facility], [place] [address], [place], [place] [address]   Uric acid     Status: None   Collection Time: 10/05/22  5:16 AM  Result Value Ref Range   Uric Acid, Serum 5.7 2.5 - 7.1 mg/dL    Comment: HEMOLYSIS AT THIS LEVEL MAY AFFECT RESULT Performed at [place] [address], [place], [place] [address]   CBG monitoring, ED     Status: Abnormal   Collection Time: 10/05/22  7:54 AM  Result Value Ref Range   Glucose-Capillary 117 (H) 70 - 99 mg/dL    Comment: Glucose reference range applies only to samples taken after fasting for at least 8 hours.  CBC     Status: Abnormal    Collection Time: 10/05/22  1:20 PM  Result Value Ref Range   WBC 3.2 (L) 4.0 - 10.5 K/uL   RBC 4.29 3.87 - 5.11 MIL/uL   Hemoglobin 12.8 12.0 - 15.0 g/dL   HCT 40.9 36.0 - 46.0 %   MCV 95.3 80.0 - 100.0 fL   MCH 29.8 26.0 - 34.0 pg   MCHC 31.3 30.0 - 36.0 g/dL   RDW 15.0 11.5 - 15.5 %   Platelets 205 150 - 400 K/uL   nRBC 0.0 0.0 - 0.2 %    Comment: Performed at [facility], [place] [address], [place],  [address]   DG Ankle Complete Right  Result Date: 10/04/2022 CLINICAL DATA:  Right ankle pain without known injury. History of gout. EXAM: RIGHT ANKLE - COMPLETE 3+ VIEW COMPARISON:  None Available. FINDINGS: There is no evidence of fracture, dislocation, or joint effusion. Mild lateral soft tissue swelling is noted. Mild anterior osteophyte formation is seen involving the talotibial joint. IMPRESSION: Mild degenerative changes as described above. No acute abnormality  seen. Electronically Signed   By: Lupita Raider M.D.   On: 10/04/2022 13:50    Pending Labs Unresulted Labs (From admission, onward)     Start     Ordered   10/05/22 1300  Comprehensive metabolic panel  Tomorrow morning,   R       Question:  Release to patient  Answer:  Immediate   10/05/22 1300   10/05/22 1300  Magnesium  Tomorrow morning,   R        10/05/22 1300   10/05/22 1300  Phosphorus  Tomorrow morning,   R        10/05/22 1300   10/04/22 2249  CK  Add-on,   AD        10/04/22 2248   10/04/22 2249  Magnesium  Add-on,   AD        10/04/22 2248   10/04/22 2249  Phosphorus  Add-on,   AD        10/04/22 2248   10/04/22 1730  Urine Culture  Add-on,   AD       Question:  Indication  Answer:  Dysuria   10/04/22 1729            Vitals/Pain Today's Vitals   10/05/22 1100 10/05/22 1130 10/05/22 1200 10/05/22 1300  BP: 129/61 124/61 125/64 125/64  Pulse: (!) 59 (!) 59 62 (!) 57  Resp: 14 14 15 16   Temp:  97.9 F (36.6 C)    TempSrc:  Oral    SpO2: 97% 99% 98% 100%  Weight:       PainSc:        Isolation Precautions No active isolations  Medications Medications  insulin aspart (novoLOG) injection 0-5 Units (2 Units Subcutaneous Given 10/04/22 2349)  insulin aspart (novoLOG) injection 0-15 Units ( Subcutaneous Not Given 10/05/22 1221)  atorvastatin (LIPITOR) tablet 80 mg (80 mg Oral Given 10/05/22 1322)  HYDROmorphone (DILAUDID) tablet 1 mg (has no administration in time range)  Basaglar KwikPen KwikPen 20 Units (has no administration in time range)  methocarbamol (ROBAXIN) tablet 500 mg (has no administration in time range)  0.9 %  sodium chloride infusion (has no administration in time range)  acetaminophen (TYLENOL) tablet 650 mg (has no administration in time range)    Or  acetaminophen (TYLENOL) suppository 650 mg (has no administration in time range)  HYDROcodone-acetaminophen (NORCO/VICODIN) 5-325 MG per tablet 1-2 tablet (has no administration in time range)  albuterol (PROVENTIL) (2.5 MG/3ML) 0.083% nebulizer solution 2.5 mg (has no administration in time range)  piperacillin-tazobactam (ZOSYN) IVPB 3.375 g (0 g Intravenous Stopped 10/05/22 0917)  colchicine tablet 0.6 mg (0.6 mg Oral Given 10/05/22 1322)  predniSONE (DELTASONE) tablet 40 mg (40 mg Oral Given 10/05/22 0807)  polyethylene glycol (MIRALAX / GLYCOLAX) packet 17 g (17 g Oral Given 10/05/22 1322)  predniSONE (DELTASONE) tablet 20 mg (20 mg Oral Given 10/04/22 1720)  acetaminophen-codeine (TYLENOL #3) 300-30 MG per tablet 1 tablet (1 tablet Oral Given 10/04/22 1719)  fosfomycin (MONUROL) packet 3 g (3 g Oral Given 10/04/22 2250)    Mobility non-ambulatory     Focused Assessments Leg Swelling   R Recommendations: See Admitting Provider Note  Report given to:   Additional Notes: .

## 2022-10-05 NOTE — Assessment & Plan Note (Signed)
Will need pt ot assessment prior to dc

## 2022-10-05 NOTE — Progress Notes (Signed)
This CSW received a message from [place] requesting [facility] orders. This Probation officer spoke with Dr. Lonny Prude and d/c plans are not clear at this time.   Pt will need HH orders prior to d/c. Mckenzie Rogers will continue to follow pt.

## 2022-10-05 NOTE — Plan of Care (Signed)

## 2022-10-05 NOTE — Progress Notes (Signed)
PHARMACY NOTE -  [facility] has been assisting with dosing of Zosyn for UTI. Dosage remains stable at 3.375 g IV q8 hr and further renal adjustments per institutional Pharmacy antibiotic protocol  Pharmacy will sign off, following peripherally for culture results, dose adjustments, and length of therapy. Please reconsult if a change in clinical status warrants re-evaluation of dosage.  Reuel Boom, PharmD, BCPS [phone] 10/05/2022, 1:06 PM

## 2022-10-05 NOTE — Progress Notes (Signed)
Pharmacy Antibiotic Note  DORETTE HARTEL is a 82 y.o. female admitted on 10/04/2022 with ankle pain.  Pharmacy has been consulted to zosyn for UTI.  Plan: Zosyn 3.375g IV q8h (4 hour infusion). Follow renal function, cultures and clinical course  Weight: 81.6 kg (179 lb 14.3 oz)  Temp (24hrs), Avg:98.1 F (36.7 C), Min:97.7 F (36.5 C), Max:98.9 F (37.2 C)  Recent Labs  Lab 10/04/22 1851  WBC 4.3  CREATININE 1.18*    Estimated Creatinine Clearance: 39.1 mL/min (A) (by C-G formula based on SCr of 1.18 mg/dL (H)).    Allergies  Allergen Reactions   Aspirin Other (See Comments)    Pt unsure   Clorazepate Dipotassium Other (See Comments)    "Can't function"   Ketoprofen Other (See Comments)    Pt unsure   Oxycodone-Aspirin     REACTION: tongue swells       Thank you for allowing pharmacy to be a part of this patient's care.  Dolly Rias RPh 10/05/2022, 12:11 AM

## 2022-10-06 DIAGNOSIS — Z885 Allergy status to narcotic agent status: Secondary | ICD-10-CM | POA: Diagnosis not present

## 2022-10-06 DIAGNOSIS — Z7901 Long term (current) use of anticoagulants: Secondary | ICD-10-CM | POA: Diagnosis not present

## 2022-10-06 DIAGNOSIS — E669 Obesity, unspecified: Secondary | ICD-10-CM | POA: Diagnosis present

## 2022-10-06 DIAGNOSIS — M170 Bilateral primary osteoarthritis of knee: Secondary | ICD-10-CM | POA: Diagnosis present

## 2022-10-06 DIAGNOSIS — N3000 Acute cystitis without hematuria: Secondary | ICD-10-CM | POA: Diagnosis present

## 2022-10-06 DIAGNOSIS — Z794 Long term (current) use of insulin: Secondary | ICD-10-CM | POA: Diagnosis not present

## 2022-10-06 DIAGNOSIS — M109 Gout, unspecified: Secondary | ICD-10-CM | POA: Diagnosis present

## 2022-10-06 DIAGNOSIS — Z86718 Personal history of other venous thrombosis and embolism: Secondary | ICD-10-CM | POA: Diagnosis not present

## 2022-10-06 DIAGNOSIS — E1122 Type 2 diabetes mellitus with diabetic chronic kidney disease: Secondary | ICD-10-CM | POA: Diagnosis present

## 2022-10-06 DIAGNOSIS — F32A Depression, unspecified: Secondary | ICD-10-CM | POA: Diagnosis present

## 2022-10-06 DIAGNOSIS — M25571 Pain in right ankle and joints of right foot: Secondary | ICD-10-CM

## 2022-10-06 DIAGNOSIS — Z1612 Extended spectrum beta lactamase (ESBL) resistance: Secondary | ICD-10-CM | POA: Diagnosis present

## 2022-10-06 DIAGNOSIS — N3281 Overactive bladder: Secondary | ICD-10-CM | POA: Diagnosis present

## 2022-10-06 DIAGNOSIS — Z96652 Presence of left artificial knee joint: Secondary | ICD-10-CM | POA: Diagnosis present

## 2022-10-06 DIAGNOSIS — N182 Chronic kidney disease, stage 2 (mild): Secondary | ICD-10-CM | POA: Diagnosis present

## 2022-10-06 DIAGNOSIS — R5381 Other malaise: Secondary | ICD-10-CM | POA: Diagnosis not present

## 2022-10-06 DIAGNOSIS — Z6831 Body mass index (BMI) 31.0-31.9, adult: Secondary | ICD-10-CM | POA: Diagnosis not present

## 2022-10-06 DIAGNOSIS — E119 Type 2 diabetes mellitus without complications: Secondary | ICD-10-CM | POA: Diagnosis not present

## 2022-10-06 DIAGNOSIS — Z91199 Patient's noncompliance with other medical treatment and regimen due to unspecified reason: Secondary | ICD-10-CM | POA: Diagnosis not present

## 2022-10-06 DIAGNOSIS — B9620 Unspecified Escherichia coli [E. coli] as the cause of diseases classified elsewhere: Secondary | ICD-10-CM | POA: Diagnosis present

## 2022-10-06 DIAGNOSIS — G4733 Obstructive sleep apnea (adult) (pediatric): Secondary | ICD-10-CM | POA: Diagnosis present

## 2022-10-06 DIAGNOSIS — M1A9XX0 Chronic gout, unspecified, without tophus (tophi): Secondary | ICD-10-CM | POA: Diagnosis present

## 2022-10-06 DIAGNOSIS — Z86711 Personal history of pulmonary embolism: Secondary | ICD-10-CM | POA: Diagnosis not present

## 2022-10-06 DIAGNOSIS — D631 Anemia in chronic kidney disease: Secondary | ICD-10-CM | POA: Diagnosis present

## 2022-10-06 DIAGNOSIS — G8929 Other chronic pain: Secondary | ICD-10-CM

## 2022-10-06 DIAGNOSIS — I129 Hypertensive chronic kidney disease with stage 1 through stage 4 chronic kidney disease, or unspecified chronic kidney disease: Secondary | ICD-10-CM | POA: Diagnosis present

## 2022-10-06 DIAGNOSIS — Z888 Allergy status to other drugs, medicaments and biological substances status: Secondary | ICD-10-CM | POA: Diagnosis not present

## 2022-10-06 DIAGNOSIS — E785 Hyperlipidemia, unspecified: Secondary | ICD-10-CM | POA: Diagnosis present

## 2022-10-06 DIAGNOSIS — E1165 Type 2 diabetes mellitus with hyperglycemia: Secondary | ICD-10-CM | POA: Diagnosis not present

## 2022-10-06 LAB — BASIC METABOLIC PANEL
Anion gap: 12 (ref 5–15)
BUN: 22 mg/dL (ref 8–23)
CO2: 21 mmol/L — ABNORMAL LOW (ref 22–32)
Calcium: 8.8 mg/dL — ABNORMAL LOW (ref 8.9–10.3)
Chloride: 105 mmol/L (ref 98–111)
Creatinine, Ser: 1.09 mg/dL — ABNORMAL HIGH (ref 0.44–1.00)
GFR, Estimated: 52 mL/min — ABNORMAL LOW (ref >60)
Glucose, Bld: 120 mg/dL — ABNORMAL HIGH (ref 70–99)
Potassium: 3.8 mmol/L (ref 3.5–5.1)
Sodium: 138 mmol/L (ref 135–145)

## 2022-10-06 LAB — GLUCOSE, CAPILLARY
Glucose-Capillary: 103 mg/dL — ABNORMAL HIGH (ref 70–99)
Glucose-Capillary: 144 mg/dL — ABNORMAL HIGH (ref 70–99)
Glucose-Capillary: 148 mg/dL — ABNORMAL HIGH (ref 70–99)
Glucose-Capillary: 163 mg/dL — ABNORMAL HIGH (ref 70–99)
Glucose-Capillary: 201 mg/dL — ABNORMAL HIGH (ref 70–99)

## 2022-10-06 LAB — SEDIMENTATION RATE: Sed Rate: 59 mm/hr — ABNORMAL HIGH (ref 0–22)

## 2022-10-06 LAB — URIC ACID: Uric Acid, Serum: 4 mg/dL (ref 2.5–7.1)

## 2022-10-06 LAB — C-REACTIVE PROTEIN: CRP: 4.1 mg/dL — ABNORMAL HIGH (ref <1.0)

## 2022-10-06 MED ORDER — SORBITOL 70 % SOLN
960.0000 mL | TOPICAL_OIL | Freq: Once | ORAL | Status: AC
  Administered 2022-10-06: 960 mL via RECTAL
  Filled 2022-10-06: qty 240

## 2022-10-06 MED ORDER — BISACODYL 5 MG PO TBEC
10.0000 mg | DELAYED_RELEASE_TABLET | Freq: Every day | ORAL | Status: DC | PRN
  Administered 2022-10-06: 10 mg via ORAL
  Filled 2022-10-06: qty 2

## 2022-10-06 MED ORDER — SENNOSIDES-DOCUSATE SODIUM 8.6-50 MG PO TABS
1.0000 | ORAL_TABLET | Freq: Two times a day (BID) | ORAL | Status: DC
  Administered 2022-10-06 – 2022-10-12 (×5): 1 via ORAL
  Filled 2022-10-06 (×11): qty 1

## 2022-10-06 NOTE — Progress Notes (Signed)
Physical Therapy Treatment Patient Details Name: Mckenzie Rogers MRN: [id number] DOB: 12/21/1940 Today's Date: 10/06/2022   History of Present Illness Patient is a 82 year old female who Developed severe gout in her right ankle unable to bare weight  PMHx: OA (b knees), DM, L TKA 12/7    PT Comments    Pt with significant fear of falling, tearful and calling out during 2 attempts to stand. Pt with strong posterior lean, cues for BUE and BLE engagement to assist in powering up to stand, therapists blocking bil feet to reduce anxiousness, needing total A+2. Offered encouragement and cues, but pt declines additional attempt to rise due to fear of falling and anxiousness. Pt returns to supine in bed with some assist and cues. Pt unable to use w/c in the home due to narrowness and small layout. Notified RN of session. Pt reports [facility] delivered her personal RW to her home since this hospital admission and picked up theirs; so no DME needs identified at this time. Continue to recommend SNF at d/c prior to return home with family assisting.    Recommendations for follow up therapy are one component of a multi-disciplinary discharge planning process, led by the attending physician.  Recommendations may be updated based on patient status, additional functional criteria and insurance authorization.  Follow Up Recommendations  Skilled nursing-short term rehab (<3 hours/day) Can patient physically be transported by private vehicle: No   Assistance Recommended at Discharge Frequent or constant Supervision/Assistance  Patient can return home with the following Assistance with cooking/housework;Assist for transportation;Help with stairs or ramp for entrance;Two people to help with walking and/or transfers;A lot of help with bathing/dressing/bathroom   Equipment Recommendations  None recommended by PT  pt reports [facility] delivered pt's RW during this hospital admission   Recommendations for Other  Services       Precautions / Restrictions Precautions Precautions: Fall Precaution Comments: recent TKA 12/7 LLE Restrictions Weight Bearing Restrictions: No     Mobility  Bed Mobility Overal bed mobility: Needs Assistance Bed Mobility: Supine to Sit, Sit to Supine  Supine to sit: Min assist, +2 for safety/equipment Sit to supine: Min guard, +2 for safety/equipment   General bed mobility comments: with increased time, assist for trunk uprighting into sitting    Transfers Overall transfer level: Needs assistance Equipment used: Rolling walker (2 wheels) Transfers: Sit to/from Stand Sit to Stand: +2 safety/equipment, +2 physical assistance, Total assist  General transfer comment: strong posterior pushing response and increased fear of falling with patient calling out in fear, pt unable to achieve upright standing despite 2 attempts with +2 assist    Ambulation/Gait    Stairs   Wheelchair Mobility    Modified Rankin (Stroke Patients Only)       Balance Overall balance assessment: Needs assistance Sitting-balance support: Feet supported Sitting balance-Leahy Scale: Good  Standing balance support: Reliant on assistive device for balance, During functional activity, Bilateral upper extremity supported Standing balance-Leahy Scale: Zero       Cognition Arousal/Alertness: Awake/alert Behavior During Therapy: Anxious Overall Cognitive Status: Within Functional Limits for tasks assessed  General Comments: patient is noted to be tearful during session with increased fears of falling and anxiousness with attempts to stand        Exercises      General Comments        Pertinent Vitals/Pain Pain Assessment Pain Assessment: Faces Faces Pain Scale: Hurts whole lot Pain Location: R ankle and L knee Pain Descriptors /  Indicators: Discomfort, Grimacing, Guarding Pain Intervention(s): Limited activity within patient's tolerance, Monitored during session, Repositioned     Home Living                          Prior Function            PT Goals (current goals can now be found in the care plan section) Acute Rehab PT Goals Patient Stated Goal: return home with family assisting PT Goal Formulation: With patient Time For Goal Achievement: 10/19/22 Potential to Achieve Goals: Good Progress towards PT goals: Progressing toward goals    Frequency    Min 3X/week      PT Plan Current plan remains appropriate    Co-evaluation   Reason for Co-Treatment: For patient/therapist safety;To address functional/ADL transfers PT goals addressed during session: Mobility/safety with mobility;Proper use of DME;Balance OT goals addressed during session: ADL's and self-care      AM-PAC PT "6 Clicks" Mobility   Outcome Measure  Help needed turning from your back to your side while in a flat bed without using bedrails?: A Little Help needed moving from lying on your back to sitting on the side of a flat bed without using bedrails?: A Little Help needed moving to and from a bed to a chair (including a wheelchair)?: Total Help needed standing up from a chair using your arms (e.g., wheelchair or bedside chair)?: Total Help needed to walk in hospital room?: Total Help needed climbing 3-5 steps with a railing? : Total 6 Click Score: 10    End of Session Equipment Utilized During Treatment: Gait belt Activity Tolerance: Other (comment) (limited by anxiousness) Patient left: in bed;with call bell/phone within reach Nurse Communication: Mobility status;Other (comment) (anxiousness, fear of falling) PT Visit Diagnosis: Muscle weakness (generalized) (M62.81);Pain Pain - Right/Left: Right Pain - part of body: Ankle and joints of foot     Time: 1353-1425 PT Time Calculation (min) (ACUTE ONLY): 32 min  Charges:  $Therapeutic Activity: 8-22 mins                      Tori Khaliel Morey PT, DPT 10/06/22, 3:58 PM

## 2022-10-06 NOTE — TOC Initial Note (Signed)
Transition of Care [facility]) - Initial/Assessment Note    Patient Details  Name: Mckenzie Rogers MRN: [id number] Date of Birth: Nov 18, 1940  Transition of Care [facility] CM/SW Contact:    Vassie Moselle, LCSW Phone Number: 10/06/2022, 12:26 PM  Clinical Narrative:                 Met with pt in room to discuss recommendation for SNF. Pt was recently in SNF at [facility] from 08/26/22 to 09/24/22. Pt shares she is currently in her co-pay days. She reports having a limited fixed income and is unable to afford co-pay amounts for SNF placement. Pt has applied for MCD for further assistance and application is currently being processed.  Pt currently resides at home with her daughter and son. Her daughter has had 2 strokes but, is able to assist as she can. Her son has an intellectual disability and is unable to assist her. She has another son who drives trucks and is out of town most of the time. She has had 4 children die with her most recent son passing away on 08/25/22 while she was in the hospital Pt shares she has a BSC at home and has a RW from [employer] that is on loan. Pt is agreeable to having RW ordered prior to discharge. RW will be ordered once DME order has been placed.  Pt shares she has [place] w/ [facility]. CSW contacted [name] who share that she was set up but, never started services due to going to SNF. [facility] is able to accept for [facility] and will provide HHPT/OT/RN/Aide. HH orders will need to be placed prior to discharge.    Expected Discharge Plan: [place] Barriers to Discharge: Continued Medical Work up   Patient Goals and CMS Choice Patient states their goals for this hospitalization and ongoing recovery are:: To go home with home health CMS Medicare.gov Compare Post Acute Care list provided to:: Patient Choice offered to / list presented to : Patient [place] ownership interest in [facility].provided to::  (NA)    Expected Discharge Plan and  Services In-house Referral: Clinical Social Work Discharge Planning Services: CM Consult Post Acute Care Choice: Home Health, Durable Medical Equipment Living arrangements for the past 2 months: [place]: PT, OT, RN, Nurse's Aide HH Agency: [place] Date [place]: 10/06/22 Time HH Agency Contacted: 1200 Representative spoke with at [place]: [place] Arrangements/Services Living arrangements for the past 2 months: [place] Lives with:: Adult Children Patient language and need for interpreter reviewed:: Yes Do you feel safe going back to the place where you live?: Yes      Need for Family Participation in Patient Care: No (Comment) Care giver support system in place?: No (comment) Current home services: DME (BSC) Criminal Activity/Legal Involvement Pertinent to Current Situation/Hospitalization: No - Comment as needed  Activities of Daily Living      Permission Sought/Granted   Permission granted to share information with : No              Emotional Assessment Appearance:: Appears stated age Attitude/Demeanor/Rapport: Engaged Affect (typically observed): Accepting Orientation: : Oriented to Self, Oriented to Place, Oriented to  Time, Oriented to Situation Alcohol / Substance Use: Not Applicable Psych Involvement: No (comment)  Admission diagnosis:  Gout attack [M10.9] Acute cystitis without hematuria [N30.00] Chronic pain of right ankle [M25.571, G89.29] Patient Active Problem List   Diagnosis Date Noted   Debility 10/05/2022   Gout attack 10/04/2022   UTI (urinary tract infection) 10/04/2022   Arthritis of left knee 08/21/2022   OA (osteoarthritis) of knee 08/19/2022   S/P TKR (total knee replacement), left 08/19/2022   Controlled type 2 diabetes mellitus without complication, without long-term current use of insulin ([employer]) 07/31/2021   Genetic testing 03/14/2020    Family history of pancreatic cancer    Family history of kidney cancer    Family history of lung cancer    PCP:  [name], Fransico Him, MD Pharmacy:   [facility] [place], [place] - [place] AT [address] [place] [address] Phone: [phone] Fax: [phone]     Social Determinants of Health (SDOH) Social History: SDOH Screenings   Food Insecurity: No Food Insecurity (08/22/2022)  Housing: Low Risk  (08/22/2022)  Transportation Needs: No Transportation Needs (08/22/2022)  Utilities: Not At Risk (08/22/2022)  Tobacco Use: Low Risk  (10/05/2022)   SDOH Interventions:     Readmission Risk Interventions     No data to display

## 2022-10-06 NOTE — Progress Notes (Signed)
Occupational Therapy Treatment Patient Details Name: Mckenzie Rogers MRN: [id number] DOB: May 25, 1941 Today's Date: 10/06/2022   History of present illness Patient is a 82 year old female who Developed severe gout in her right ankle unable to bare weight  PMHx: OA (b knees), DM, L TKA 12/7   OT comments  Patient has increased fear of falling with increased pain with attempts to participate in standing at EOB requiring TD +2 assistance. Patient demonstrated strong posterior pushing response with attempts and calling out in fear. Patient continues to report that family at home are not able to offer physical assistance to her for ADL tasks. Patient is unable to use w/c in house due to layout per patient report. Patient's discharge plan remains appropriate at this time. OT will continue to follow acutely.     Recommendations for follow up therapy are one component of a multi-disciplinary discharge planning process, led by the attending physician.  Recommendations may be updated based on patient status, additional functional criteria and insurance authorization.    Follow Up Recommendations  Skilled nursing-short term rehab (<3 hours/day)     Assistance Recommended at Discharge Frequent or constant Supervision/Assistance  Patient can return home with the following  A lot of help with walking and/or transfers;A lot of help with bathing/dressing/bathroom;Assistance with cooking/housework;Direct supervision/assist for financial management;Assist for transportation;Help with stairs or ramp for entrance;Direct supervision/assist for medications management   Equipment Recommendations  Other (comment) (concerns over d/c home)       Precautions / Restrictions Precautions Precautions: Fall Precaution Comments: recent TKA 12/7 LLE Restrictions Weight Bearing Restrictions: No       Mobility Bed Mobility Overal bed mobility: Needs Assistance Bed Mobility: Supine to Sit, Sit to Supine      Supine to sit: Min assist, +2 for safety/equipment Sit to supine: Min guard, +2 for safety/equipment   General bed mobility comments: with increased time, assist for trunk uprighting into sitting    Transfers Overall transfer level: Needs assistance Equipment used: Rolling walker (2 wheels) Transfers: Sit to/from Stand Sit to Stand: +2 safety/equipment, +2 physical assistance, Total assist           General transfer comment: strong posterior pushing response and increased fear of falling with patient calling out in fear         ADL either performed or assessed with clinical judgement   ADL Overall ADL's : Needs assistance/impaired                       Lower Body Dressing Details (indicate cue type and reason): patient was able to don slipper on L foot with increased time and figure four method and RLE needed max A to don shoe. patient asked if daughter or son could assist with dressing at home. patient reported no.               General ADL Comments: patient attempted to stand with +2  assist with patient overly anxious. patient reported that she was scared of falling with patient noted to have strong posterior pushing to sit down calling out about falling. patient was educated on two therapist being present and attempted to talk patient through the fears to find a way to move forward.patient was tearful on and off with overwhelmed feeling reporting that she feels like she is going to fall here and again at home. chaplin was paged to speak with patient. patient attempted standing again with increased A needed +2 with strong posterior  pushing backwards. patient returned to bed.      Cognition Arousal/Alertness: Awake/alert Behavior During Therapy: Anxious Overall Cognitive Status: Within Functional Limits for tasks assessed         General Comments: patient is noted to be tearful during session with increased fears of falling and anxiousness with attempts to  stand                   Pertinent Vitals/ Pain       Pain Assessment Pain Assessment: Faces Faces Pain Scale: Hurts whole lot Pain Location: R ankle and L knee Pain Descriptors / Indicators: Discomfort, Grimacing, Guarding Pain Intervention(s): Limited activity within patient's tolerance, Premedicated before session, Monitored during session, Repositioned         Frequency  Min 2X/week        Progress Toward Goals  OT Goals(current goals can now be found in the care plan section)  Progress towards OT goals: Progressing toward goals     Plan Discharge plan remains appropriate    Co-evaluation    PT/OT/SLP Co-Evaluation/Treatment: Yes Reason for Co-Treatment: To address functional/ADL transfers;For patient/therapist safety PT goals addressed during session: Mobility/safety with mobility OT goals addressed during session: ADL's and self-care      AM-PAC OT "6 Clicks" Daily Activity     Outcome Measure   Help from another person eating meals?: A Little Help from another person taking care of personal grooming?: A Little Help from another person toileting, which includes using toliet, bedpan, or urinal?: A Lot Help from another person bathing (including washing, rinsing, drying)?: A Lot Help from another person to put on and taking off regular upper body clothing?: A Little Help from another person to put on and taking off regular lower body clothing?: A Lot 6 Click Score: 15    End of Session Equipment Utilized During Treatment: Gait belt;Rolling walker (2 wheels)  OT Visit Diagnosis: Unsteadiness on feet (R26.81);Other abnormalities of gait and mobility (R26.89);Pain   Activity Tolerance Patient limited by pain   Patient Left in bed;with call bell/phone within reach;with bed alarm set   Nurse Communication Mobility status        Time: 3267-1245 OT Time Calculation (min): 32 min  Charges: OT Treatments $Therapeutic Activity: 8-22 mins  Rennie Plowman, MS Acute Rehabilitation Department Office# [phone]   Willa Rough 10/06/2022, 3:05 PM

## 2022-10-06 NOTE — Progress Notes (Addendum)
CSW received a call from the patients son, the son stated that his mom is not ready for DC and lives at home will not be safe as she lives by herself. The son would like to see his other options for her. The patient is currently in her co-pay days and had to leave [employer]. The son stated that he won the appeal however she was already home. This CSW received the call and explained that I/ CSW would have the CSW that covers this floor to help talk through options and a safe DC for his mom. TOC will continue to follow.

## 2022-10-06 NOTE — Progress Notes (Signed)
[facility]                                                                              Mckenzie Rogers, is a 82 y.o. female, DOB - July 03, 1941, ZOX:[id number] Admit date - 10/04/2022    Outpatient Primary MD for the patient is [name], [name], MD  LOS - 0  days  Chief Complaint  Patient presents with   Leg Swelling       Brief summary   Mckenzie Rogers is a 82 y.o. female with a history of gout, history of left knee replacement, remote DVT, diabetes mellitus type 2, CKD 2. Patient presented secondary to right ankle swelling and inability to bear weight. Patient started on prednisone and colchicine. PT/OT ordered. While admitted, patient found to have a UTI with history of ESBL.   Assessment & Plan    Principal Problem: Acute on chronic gout with flare, right ankle -Manages at home on colchicine as needed, no maintenance medication. - started on prednisone, colchicine this admission -Obtain ESR, CRP, uric acid (uric acid was 5.5 on 09/13/2022) -PT OT evaluation   Active Problems:  E. coli UTI -Complained of dysuria and UA + UTI.   -Previous history of ESBL UTI 08/09/2022 -Patient was started on IV Zosyn, for now continue and await cultures  Diabetes mellitus type 2, IDDM, uncontrolled with hyperglycemia -Hemoglobin A1c 6.3 - CBG (last 3)  Recent Labs    10/06/22 0021 10/06/22 0716 10/06/22 1125  GLUCAP 144* 103* 148*  -Continue Semglee 20 units daily, moderate SSI    Overactive bladder Patient is on Toviaz and Myrbetriq as an outpatient.   Hyperlipidemia -Continue Lipitor   Debility Patient is s/p left total knee arthroplasty. She was previously on Xarelto for DVT prophylaxis which is now discontinued.  Obesity Estimated body mass index is 31.87 kg/m as calculated from the following:   Height as of 09/13/22: 5\' 3"  (1.6 m).   Weight as of this encounter: 81.6 kg.  Code Status: Full code DVT Prophylaxis:  enoxaparin (LOVENOX)  injection 40 mg Start: 10/05/22 2200 SCDs Start: 10/05/22 1300   Level of Care: Level of care: Med-Surg Family Communication: Updated patient Disposition Plan:      Remains inpatient appropriate: Possibly DC home in 24 to 48 hours if able to ambulate, currently PT recommending SNF.    Procedures:  None  Consultants:   None  Antimicrobials:   Anti-infectives (From admission, onward)    Start     Dose/Rate Route Frequency Ordered Stop   10/04/22 2330  piperacillin-tazobactam (ZOSYN) IVPB 3.375 g        3.375 g 12.5 mL/hr over 240 Minutes Intravenous Every 8 hours 10/04/22 2259     10/04/22 2200  fosfomycin (MONUROL) packet 3 g        3 g Oral  Once 10/04/22 2152 10/04/22 2250          Medications  atorvastatin  80 mg Oral Daily   colchicine  0.6 mg Oral BID   enoxaparin (LOVENOX) injection  40 mg Subcutaneous Q24H   insulin aspart  0-15 Units Subcutaneous TID WC   insulin aspart  0-5 Units Subcutaneous QHS   insulin glargine-yfgn  20 Units Subcutaneous Daily   polyethylene glycol  17 g Oral Daily   predniSONE  40 mg Oral Q breakfast   senna-docusate  1 tablet Oral BID   sorbitol, milk of mag, mineral oil, glycerin (SMOG) enema  960 mL Rectal Once      Subjective:   Mckenzie Rogers was seen and examined today.  Does not like colchicine, states overall improving however not still yet at baseline.  Feels constipated, requesting enema.  No acute abdominal pain, nausea vomiting, chest pain or shortness of breath.    Objective:   Vitals:   10/05/22 1643 10/05/22 2033 10/06/22 0033 10/06/22 0458  BP: 119/82 (!) 97/50 110/87 130/71  Pulse: 66 99 (!) 56 (!) 58  Resp: 18 17 17 16   Temp: 98.2 F (36.8 C) 97.7 F (36.5 C) 97.7 F (36.5 C) 98.2 F (36.8 C)  TempSrc: Oral Oral Oral   SpO2: 99% 100% 100% 100%  Weight:        Intake/Output Summary (Last 24 hours) at 10/06/2022 1325 Last data filed at 10/05/2022 2211 Gross per 24 hour  Intake 120 ml  Output 200 ml   Net -80 ml     Wt Readings from Last 3 Encounters:  10/04/22 81.6 kg  09/13/22 81.6 kg  08/19/22 81.6 kg     Exam General: Alert and oriented x 3, NAD Cardiovascular: S1 S2 auscultated,  RRR Respiratory: Clear to auscultation bilaterally, no wheezing Gastrointestinal: Soft, nontender, nondistended, + bowel sounds Ext: no pedal edema bilaterally Neuro: no new FND's Skin: No rashes Psych: Normal affect and demeanor, alert and oriented x3     Data Reviewed:  I have personally reviewed following labs    CBC Lab Results  Component Value Date   WBC 3.2 (L) 10/05/2022   RBC 4.29 10/05/2022   HGB 12.8 10/05/2022   HCT 40.9 10/05/2022   MCV 95.3 10/05/2022   MCH 29.8 10/05/2022   PLT 205 10/05/2022   MCHC 31.3 10/05/2022   RDW 15.0 10/05/2022   LYMPHSABS 2.0 10/04/2022   MONOABS 0.5 10/04/2022   EOSABS 0.2 10/04/2022   BASOSABS 0.0 38/25/0539     Last metabolic panel Lab Results  Component Value Date   NA 138 10/05/2022   K 4.2 10/05/2022   CL 104 10/05/2022   CO2 23 10/05/2022   BUN 23 10/05/2022   CREATININE 1.14 (H) 10/05/2022   GLUCOSE 143 (H) 10/05/2022   GFRNONAA 49 (L) 10/05/2022   CALCIUM 8.9 10/05/2022   PHOS 3.7 10/05/2022   PROT 8.3 (H) 10/05/2022   ALBUMIN 3.2 (L) 10/05/2022   BILITOT 0.7 10/05/2022   ALKPHOS 61 10/05/2022   AST 37 10/05/2022   ALT 30 10/05/2022   ANIONGAP 11 10/05/2022    CBG (last 3)  Recent Labs    10/06/22 0021 10/06/22 0716 10/06/22 1125  GLUCAP 144* 103* 148*      Coagulation Profile: No results for input(s): "INR", "PROTIME" in the last 168 hours.   Radiology Studies: I have personally reviewed the imaging studies  DG Ankle Complete Right  Result Date: 10/04/2022 CLINICAL DATA:  Right ankle pain without known injury. History of gout. EXAM: RIGHT ANKLE - COMPLETE 3+ VIEW COMPARISON:  None Available. FINDINGS: There is no evidence of fracture, dislocation, or joint effusion. Mild lateral soft tissue  swelling is noted. Mild anterior osteophyte formation is seen involving the talotibial joint. IMPRESSION: Mild degenerative changes as described above. No acute abnormality  seen. Electronically Signed   By: Marijo Conception M.D.   On: 10/04/2022 13:50       Falyn Rubel M.D. [facility] 10/06/2022, 1:25 PM  Available via Epic secure chat 7am-7pm After 7 pm, please refer to night coverage provider listed on amion.

## 2022-10-06 NOTE — Progress Notes (Signed)
Chaplain met with Shaquetta who shared about her medical journey over the last several weeks.  She is frustrated that she is back in the hospital and feels that if she had been given a maintenance medication for her gout that she would not  be back here.  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  She had her surgery on Dec 7 and on Dec 13, her son, who had just come to visit her, died very suddenly.  She has deep regret that she didn't get to hold his hand or attend his funeral.  She has lost 3 other children prior to this loss and she wishes she had been able to be there for her son in the ways that she was there for her other children at the end of their lives.  She is hopeful that taking something for depression will help.  She is also anxious about falling and knows that the fear is in her head and that she needs to "get her mind right" so that she can "do what she needs to do."  She is still very sharp mentally and is grateful for that every day.  She also seems to be very in touch with her body and what feels right or doesn't feel right to her.  She appreciated the visit.  Chaplain provided emotional and grief support through reflective listening as she shared about her medical care and her grief.  [address], [place] Pager, [phone]

## 2022-10-06 NOTE — Plan of Care (Signed)

## 2022-10-06 NOTE — Progress Notes (Signed)
Nutrition Note  RD consulted for nutritional assessment. Pt admitted with gout flare. Pt states she has questions regarding foods to avoid and how to manage diabetes as well.    Lab Results  Component Value Date   HGBA1C 6.3 (H) 10/04/2022    RD provided "Low Purine/Purine restricted Nutrition Therapy" and "Carbohydrate Counting for People with Diabetes" handout from the Academy of Nutrition and Dietetics. Discussed different food groups and their effects on blood sugar, emphasizing carbohydrate-containing foods. Provided list of carbohydrates and recommended serving sizes of common foods.  Discussed importance of controlled and consistent carbohydrate intake throughout the day. Provided examples of ways to balance meals/snacks and encouraged intake of high-fiber, whole grain complex carbohydrates. Teach back method used.  Expect good compliance. Pt seems very concerned about her diet and wants to do what she can to prevent further flares. Pt states she knows her blood sugars are elevated d/t steroids. Will provide information per her request.  Body mass index is 31.87 kg/m. Pt meets criteria for obesity based on current BMI.  Current diet order is CHO modified.  Labs and medications reviewed. No further nutrition interventions warranted at this time.  If additional nutrition issues arise, please re-consult RD.  Clayton Bibles, MS, RD, LDN Inpatient Clinical Dietitian Contact information available via Amion

## 2022-10-07 DIAGNOSIS — N3000 Acute cystitis without hematuria: Secondary | ICD-10-CM | POA: Diagnosis not present

## 2022-10-07 DIAGNOSIS — R5381 Other malaise: Secondary | ICD-10-CM | POA: Diagnosis not present

## 2022-10-07 DIAGNOSIS — E119 Type 2 diabetes mellitus without complications: Secondary | ICD-10-CM | POA: Diagnosis not present

## 2022-10-07 DIAGNOSIS — M25571 Pain in right ankle and joints of right foot: Secondary | ICD-10-CM | POA: Diagnosis not present

## 2022-10-07 LAB — URINE CULTURE: Culture: >=100000 — AB

## 2022-10-07 LAB — BASIC METABOLIC PANEL
Anion gap: 10 (ref 5–15)
BUN: 20 mg/dL (ref 8–23)
CO2: 23 mmol/L (ref 22–32)
Calcium: 8.7 mg/dL — ABNORMAL LOW (ref 8.9–10.3)
Chloride: 103 mmol/L (ref 98–111)
Creatinine, Ser: 0.99 mg/dL (ref 0.44–1.00)
GFR, Estimated: 58 mL/min — ABNORMAL LOW (ref >60)
Glucose, Bld: 89 mg/dL (ref 70–99)
Potassium: 3.7 mmol/L (ref 3.5–5.1)
Sodium: 136 mmol/L (ref 135–145)

## 2022-10-07 LAB — GLUCOSE, CAPILLARY
Glucose-Capillary: 88 mg/dL (ref 70–99)
Glucose-Capillary: 109 mg/dL — ABNORMAL HIGH (ref 70–99)
Glucose-Capillary: 99 mg/dL (ref 70–99)
Glucose-Capillary: 148 mg/dL — ABNORMAL HIGH (ref 70–99)

## 2022-10-07 MED ORDER — MIRABEGRON ER 25 MG PO TB24
25.0000 mg | ORAL_TABLET | Freq: Every day | ORAL | Status: DC
  Administered 2022-10-07 – 2022-10-12 (×6): 25 mg via ORAL
  Filled 2022-10-07 (×6): qty 1

## 2022-10-07 MED ORDER — SODIUM CHLORIDE 0.9 % IV SOLN
1.0000 g | Freq: Two times a day (BID) | INTRAVENOUS | Status: AC
  Administered 2022-10-07 – 2022-10-09 (×5): 1 g via INTRAVENOUS
  Filled 2022-10-07 (×5): qty 20

## 2022-10-07 MED ORDER — MIRTAZAPINE 15 MG PO TABS
7.5000 mg | ORAL_TABLET | Freq: Every day | ORAL | Status: DC
  Administered 2022-10-07 – 2022-10-11 (×5): 7.5 mg via ORAL
  Filled 2022-10-07 (×6): qty 1

## 2022-10-07 MED ORDER — ESCITALOPRAM OXALATE 20 MG PO TABS
20.0000 mg | ORAL_TABLET | Freq: Every day | ORAL | Status: DC
  Administered 2022-10-07 – 2022-10-12 (×6): 20 mg via ORAL
  Filled 2022-10-07 (×6): qty 1

## 2022-10-07 NOTE — Progress Notes (Signed)
Plan for likely right ankle/right knee aspiration and injection first thing tomorrow morning prior to first case in OR at 7:15 AM

## 2022-10-07 NOTE — TOC Progression Note (Signed)
Transition of Care [facility]) - Progression Note    Patient Details  Name: Mckenzie Rogers MRN: [id number] Date of Birth: April 03, 1941  Transition of Care [facility] CM/SW [place], LCSW Phone Number: 10/07/2022, 1:48 PM  Clinical Narrative:    CSW spoke with pt's son via t/c who shares that he does not feel that it is safe for pt to return home. He shares that pt does not live with daughter or son and states that she lives at home alone. He reports that once pt returned home from SNF she stayed in the same spot for 10 days and refused to move or to anything. She refused to work with [facility] and she refused to leave the house for appointments. She would not get up to use BSC and would soil herself instead.  Pt's son shared that he had appealed pt's insurance when she was at [employer] and he won the appeal so he believes they would approve her to go to SNF again. CSW shared that insurance may approve her to go to SNF but, she is In her co-pay days and insurance would only pay a portion of the cost. Pt has shared she does not have the income to pay the co-pay amounts and son confirms this. He also states that hiring a 24/7 caregiver is not an option. He reports no family can stay with her and she is not able to move in with family. CSW shared that [facility] may be an option to assist however, he believes pt will refuse to participate in their services. Pt's son believes that there is "something mental" going on with his mother and feels that she needs to be seen by a psychiatrist. He feels that if pt returns home there will be no changes and she will die.   Pt is currently set up with [facility] services through [place]. TOC will continue to follow for discharge planning.    Expected Discharge Plan: [place] Barriers to Discharge: Continued Medical Work up  Expected Discharge Plan and Services In-house Referral: Clinical Social Work Discharge Planning Services: CM Consult Post Acute  Care Choice: Home Health, Durable Medical Equipment Living arrangements for the past 2 months: [place]: PT, OT, RN, Nurse's Aide HH Agency: [facility] Date [place]: 10/06/22 Time [place]: 1200 Representative spoke with at [place]: [place] Determinants of Health ([place]) Interventions [place]: No Food Insecurity (08/22/2022)  Housing: Low Risk  (08/22/2022)  Transportation Needs: No Transportation Needs (08/22/2022)  Utilities: Not At Risk (08/22/2022)  Tobacco Use: Low Risk  (10/05/2022)    Readmission Risk Interventions    10/07/2022    1:48 PM  Readmission Risk Prevention Plan  Transportation Screening Complete  PCP or Specialist Appt within 5-7 Days Complete  Home Care Screening Complete  Medication Review (RN CM) Complete

## 2022-10-07 NOTE — Progress Notes (Addendum)
[facility]                                                                              [name], is a 82 y.o. female, DOB - 01/12/1941, MWU:[id number] Admit date - 10/04/2022    Outpatient Primary MD for the patient is [name], [name], MD  LOS - 1  days  Chief Complaint  Patient presents with   Leg Swelling       Brief summary   Mckenzie Rogers is a 82 y.o. female with a history of gout, history of left knee replacement, remote DVT, diabetes mellitus type 2, CKD 2. Patient presented secondary to right ankle swelling and inability to bear weight. Patient started on prednisone and colchicine. PT/OT ordered. While admitted, patient found to have a UTI with history of ESBL.   Assessment & Plan    Principal Problem: Acute on chronic gout with flare, right ankle, right knee -Manages at home on colchicine as needed, no maintenance medication. - started on prednisone, colchicine this admission -ESR 59, CRP 4.1, uric acid 4.0 -Patient had a left total knee arthroplasty on 08/19/2022, during postop.,  She was noted to have right knee pain, had arthrocentesis done, negative for septic arthritis but showed gout crystals.. -Patient continues to complain of pain, difficult to ambulate, reported that intra-articular injection during previous admission had helped. - d/w on call orthopedics, Dr. Steward Drone rec'd to continue prednisone with taper, colchicine, possible  intra-articular injection and evaluation. Requested Ortho to evaluate (C Magnant, PA-C)   Active Problems:  E. coli UTI -Complained of dysuria and UA + UTI.   -Previous history of ESBL UTI 08/09/2022 -Urine culture positive for ESBL E. coli, change to IV meropenem per pharmacy  Diabetes mellitus type 2, IDDM, uncontrolled with hyperglycemia -Hemoglobin A1c 6.3 -Continue Semglee 20 units daily, moderate SSI    Overactive bladder Patient is on Toviaz and Myrbetriq as an outpatient.    Hyperlipidemia -Continue Lipitor   Debility Patient is s/p left total knee arthroplasty in 08/2022. She was previously on Xarelto for DVT prophylaxis which is now discontinued. -Concern for underlying depression, noted that patient was on Wellbutrin and Lexapro however per son she was not taking any medications at home.  He is also concerned about possible depression versus dementia with behavioral issues.  -Will resume Lexapro qAM and since patient is not sleeping well, will start her on low-dose Remeron 7.5 mg at bedtime -Will do capacity evaluation  Obesity Estimated body mass index is 31.87 kg/m as calculated from the following:   Height as of 09/13/22: 5\' 3"  (1.6 m).   Weight as of this encounter: 81.6 kg.  Code Status: Full code DVT Prophylaxis:  enoxaparin (LOVENOX) injection 40 mg Start: 10/05/22 2200 SCDs Start: 10/05/22 1300   Level of Care: Level of care: Med-Surg Family Communication: Updated patient's son, 10/07/22 [facility]) on phone Disposition Plan:      Remains inpatient appropriate: TBD   Called patient's son to update and discussed about the patient.  Patient's son reported that she lives alone and feels that she is unsafe to be living alone at home by herself.  Per son, she came home from SNF and just sits in one place, does not move even to go to the bathroom and would be sitting in her urine and BM. She refused to go to PCP or doctors appointments, gets combative and agitated when transportation came. She was not taking any of her medications.  He feels that there may be underlying psychiatric issue or dementia with behavioral issues going on.    Procedures:  None  Consultants:   Orthopedics  Antimicrobials:   Anti-infectives (From admission, onward)    Start     Dose/Rate Route Frequency Ordered Stop   10/04/22 2330  piperacillin-tazobactam (ZOSYN) IVPB 3.375 g        3.375 g 12.5 mL/hr over 240 Minutes Intravenous Every 8 hours 10/04/22 2259      10/04/22 2200  fosfomycin (MONUROL) packet 3 g        3 g Oral  Once 10/04/22 2152 10/04/22 2250          Medications  atorvastatin  80 mg Oral Daily   colchicine  0.6 mg Oral BID   enoxaparin (LOVENOX) injection  40 mg Subcutaneous Q24H   insulin aspart  0-15 Units Subcutaneous TID WC   insulin aspart  0-5 Units Subcutaneous QHS   insulin glargine-yfgn  20 Units Subcutaneous Daily   polyethylene glycol  17 g Oral Daily   predniSONE  40 mg Oral Q breakfast   senna-docusate  1 tablet Oral BID      Subjective:   Mckenzie Rogers was seen and examined today.  Continues to complain of foot pain in the right knee and ankle, causing debilitation and inability to ambulate.  Constipation resolved.  No acute nausea vomiting or abdominal pain.    Objective:   Vitals:   10/06/22 0458 10/06/22 1327 10/06/22 2119 10/07/22 0449  BP: 130/71 (!) 113/56 101/60 114/60  Pulse: (!) 58 (!) 56 (!) 50 (!) 51  Resp: 16 18 20 16   Temp: 98.2 F (36.8 C) 98.2 F (36.8 C) 98.2 F (36.8 C) 97.8 F (36.6 C)  TempSrc:  Oral Oral Oral  SpO2: 100% 99% 100% 100%  Weight:        Intake/Output Summary (Last 24 hours) at 10/07/2022 1239 Last data filed at 10/06/2022 2159 Gross per 24 hour  Intake 120 ml  Output 451 ml  Net -331 ml     Wt Readings from Last 3 Encounters:  10/04/22 81.6 kg  09/13/22 81.6 kg  08/19/22 81.6 kg   Physical Exam General: Alert and oriented x 3, NAD Cardiovascular: S1 S2 clear, RRR.  Respiratory: CTAB, no wheezing Gastrointestinal: Soft, nontender, nondistended, NBS Ext: no pedal edema bilaterally, dressing intact on the right ankle Neuro: no new deficits Psych: Normal affect and demeanor, alert and oriented x3     Data Reviewed:  I have personally reviewed following labs    CBC Lab Results  Component Value Date   WBC 3.2 (L) 10/05/2022   RBC 4.29 10/05/2022   HGB 12.8 10/05/2022   HCT 40.9 10/05/2022   MCV 95.3 10/05/2022   MCH 29.8 10/05/2022    PLT 205 10/05/2022   MCHC 31.3 10/05/2022   RDW 15.0 10/05/2022   LYMPHSABS 2.0 10/04/2022   MONOABS 0.5 10/04/2022   EOSABS 0.2 10/04/2022   BASOSABS 0.0 24/26/8341     Last metabolic panel Lab Results  Component Value Date   NA 136 10/07/2022   K 3.7 10/07/2022   CL 103 10/07/2022   CO2 23  10/07/2022   BUN 20 10/07/2022   CREATININE 0.99 10/07/2022   GLUCOSE 89 10/07/2022   GFRNONAA 58 (L) 10/07/2022   CALCIUM 8.7 (L) 10/07/2022   PHOS 3.7 10/05/2022   PROT 8.3 (H) 10/05/2022   ALBUMIN 3.2 (L) 10/05/2022   BILITOT 0.7 10/05/2022   ALKPHOS 61 10/05/2022   AST 37 10/05/2022   ALT 30 10/05/2022   ANIONGAP 10 10/07/2022    CBG (last 3)  Recent Labs    10/06/22 2041 10/07/22 0743 10/07/22 1152  GLUCAP 163* 88 109*      Coagulation Profile: No results for input(s): "INR", "PROTIME" in the last 168 hours.   Radiology Studies: I have personally reviewed the imaging studies  No results found.     Estill Cotta M.D. [facility] 10/07/2022, 12:39 PM  Available via Epic secure chat 7am-7pm After 7 pm, please refer to night coverage provider listed on amion.

## 2022-10-07 NOTE — Progress Notes (Signed)
Pharmacy Antibiotic Note  Mckenzie Rogers is a 82 y.o. female admitted on 10/04/2022 with leg swelling.  Pharmacy has been consulted for meropenem dosing for ESBL E Coli UTI.   Plan: Meropenem 1 gm IV q12 hr  Weight: 81.6 kg (179 lb 14.3 oz)  Temp (24hrs), Avg:98.1 F (36.7 C), Min:97.8 F (36.6 C), Max:98.2 F (36.8 C)  Recent Labs  Lab 10/04/22 1851 10/05/22 1320 10/06/22 1344 10/07/22 0507  WBC 4.3 3.2*  --   --   CREATININE 1.18* 1.14* 1.09* 0.99    Estimated Creatinine Clearance: 46.6 mL/min (by C-G formula based on SCr of 0.99 mg/dL).    Allergies  Allergen Reactions   Aspirin Other (See Comments)    Pt unsure of reaction   Clorazepate Dipotassium Other (See Comments)    "Can't function"   Ketoprofen Other (See Comments)    Pt unsure of reaction   Oxycodone-Aspirin Swelling and Other (See Comments)    Tongue swells      Antimicrobials this admission: 1/22 fosfomycin 3 gm x 1 1/22 Zosyn>> 1/25 1/25 Meropenem>> Dose adjustments this admission:  Microbiology results: 1/22 UCx: > 100 K ESBL E Coli F  Thank you for allowing pharmacy to be a part of this patient's care.  Eudelia Bunch, Pharm.D Use secure chat for questions 10/07/2022 1:06 PM

## 2022-10-07 NOTE — Plan of Care (Signed)

## 2022-10-08 DIAGNOSIS — M109 Gout, unspecified: Secondary | ICD-10-CM | POA: Diagnosis not present

## 2022-10-08 DIAGNOSIS — M25571 Pain in right ankle and joints of right foot: Secondary | ICD-10-CM | POA: Diagnosis not present

## 2022-10-08 DIAGNOSIS — N3000 Acute cystitis without hematuria: Secondary | ICD-10-CM | POA: Diagnosis not present

## 2022-10-08 DIAGNOSIS — G8929 Other chronic pain: Secondary | ICD-10-CM | POA: Diagnosis not present

## 2022-10-08 LAB — SYNOVIAL CELL COUNT + DIFF, W/ CRYSTALS
Crystals, Fluid: NONE SEEN
Lymphocytes-Synovial Fld: 42 % — ABNORMAL HIGH (ref 0–20)
Monocyte-Macrophage-Synovial Fluid: 43 % — ABNORMAL LOW (ref 50–90)
Neutrophil, Synovial: 15 % (ref 0–25)
WBC, Synovial: 175 /mm3 (ref 0–200)

## 2022-10-08 LAB — GLUCOSE, CAPILLARY
Glucose-Capillary: 67 mg/dL — ABNORMAL LOW (ref 70–99)
Glucose-Capillary: 93 mg/dL (ref 70–99)
Glucose-Capillary: 132 mg/dL — ABNORMAL HIGH (ref 70–99)
Glucose-Capillary: 248 mg/dL — ABNORMAL HIGH (ref 70–99)
Glucose-Capillary: 242 mg/dL — ABNORMAL HIGH (ref 70–99)

## 2022-10-08 MED ORDER — INSULIN ASPART 100 UNIT/ML IJ SOLN
0.0000 [IU] | Freq: Three times a day (TID) | INTRAMUSCULAR | Status: DC
  Administered 2022-10-08: 3 [IU] via SUBCUTANEOUS
  Administered 2022-10-09 – 2022-10-10 (×3): 2 [IU] via SUBCUTANEOUS
  Administered 2022-10-10: 1 [IU] via SUBCUTANEOUS
  Administered 2022-10-11: 2 [IU] via SUBCUTANEOUS
  Administered 2022-10-11: 1 [IU] via SUBCUTANEOUS

## 2022-10-08 MED ORDER — INSULIN GLARGINE-YFGN 100 UNIT/ML ~~LOC~~ SOLN
16.0000 [IU] | Freq: Every day | SUBCUTANEOUS | Status: DC
  Administered 2022-10-09 – 2022-10-12 (×4): 16 [IU] via SUBCUTANEOUS
  Filled 2022-10-08 (×4): qty 0.16

## 2022-10-08 NOTE — Procedures (Signed)
Orthopedic evaluation requested by primary team.  Patient has history of gout with previous right knee aspiration following her left total knee arthroplasty last year.  She had Toradol injection after that aspiration that provided good relief for her.  This lasted up until a couple days after she left the hospital and then she states that pain recurred and she has also noticed increased pain in her right ankle.  Currently the right ankle bothers her more than the right knee.  Her left knee is doing very well and she is having really no difficulties with that knee.  She states that the left knee is her "good knee".  She denies any fevers or chills.  Vital signs are stable.  On exam, she has a small effusion.  She appears well with no signs of toxicity.  She has mild pain with passive motion of the right knee and right ankle.  Tender over the ankle joint line.  Not really a lot of swelling of the right ankle relative to the left.  She has intact EHL and ankle dorsiflexion.  Knee was prepped with commendation of alcohol and Betadine.  Anesthesia was administered with 5 cc of 1% lidocaine.  3 cc of 1% lidocaine was administered to the anterior aspect of the ankle between the EHL and tibialis anterior tendons after being prepped with alcohol and Betadine as well.  About 10 minutes later, empty syringe with 18-gauge needle was introduced to the right knee through superior lateral approach with aspiration of about 7 cc of nonpurulent inflammatory synovial fluid.  This will be sent off for Gram stain and fluid analysis.  Cortisone injection was administered consisting of 4 cc of bupivacaine mixed with 1 cc of 40 mg Depo-Medrol.  The right ankle had sterile syringe with 22-gauge needle introduced to the point of anesthesia with no fluid able to be aspirated.  Cortisone injection administered consisting of 1.5 cc of bupivacaine mixed with half cc of 20 mg Depo-Medrol.  Patient tolerated the procedure well without  difficulty.  There were no complications.  Sterile technique was utilized throughout the entire procedure.  She was reevaluated 5 minutes later with significant improvement in passive range of motion of her knee and her ankle.  Plan for her to follow-up with Dr. Marlou Sa in clinic at her next scheduled appointment in early February.  Feel free to reach out to myself or Dr. Marlou Sa on secure chat or by phone with any concerns today.  [address] [place] 7:27 AM 10/08/22

## 2022-10-08 NOTE — NC FL2 (Signed)
[place] MEDICAID FL2 LEVEL OF CARE FORM     IDENTIFICATION  Patient Name: Mckenzie Rogers Birthdate: 12-20-1940 Sex: female Admission Date (Current Location): 10/04/2022  [facility] and [place] Number:  Herbalist and Address:  [facility],  [place] [address], [place]      Provider Number: [id number]  Attending Physician Name and Address:  Mendel Corning, MD  Relative Name and Phone Number:  Lurlean Leyden (son) Ph: [phone]    Current Level of Care: Hospital Recommended Level of Care: [place] Prior Approval Number:    Date Approved/Denied:   PASRR Number: [id number]  Discharge Plan: SNF    Current Diagnoses: Patient Active Problem List   Diagnosis Date Noted   Debility 10/05/2022   Gout attack 10/04/2022   UTI (urinary tract infection) 10/04/2022   Arthritis of left knee 08/21/2022   OA (osteoarthritis) of knee 08/19/2022   S/P TKR (total knee replacement), left 08/19/2022   Controlled type 2 diabetes mellitus without complication, without long-term current use of insulin ([place]) 07/31/2021   Genetic testing 03/14/2020   Family history of pancreatic cancer    Family history of kidney cancer    Family history of lung cancer     Orientation RESPIRATION BLADDER Height & Weight     Self, Time, Situation, Place  Normal Incontinent Weight: 179 lb 14.3 oz (81.6 kg) Height:     BEHAVIORAL SYMPTOMS/MOOD NEUROLOGICAL BOWEL NUTRITION STATUS      Continent Diet (Carb modified diet)  AMBULATORY STATUS COMMUNICATION OF NEEDS Skin   Extensive Assist Verbally Normal                       Personal Care Assistance Level of Assistance  Bathing, Feeding, Dressing Bathing Assistance: Limited assistance Feeding assistance: Independent Dressing Assistance: Limited assistance     Functional Limitations Info  Sight, Hearing, Speech Sight Info: Impaired Hearing Info: Adequate Speech Info: Adequate    SPECIAL CARE  FACTORS FREQUENCY  PT (By licensed PT), OT (By licensed OT)     PT Frequency: 5x's/week OT Frequency: 5x's/week            Contractures Contractures Info: Not present    Additional Factors Info  Code Status, Allergies, Psychotropic, Insulin Sliding Scale Code Status Info: Full Allergies Info: Aspirin, Clorazepate Dipotassium, Ketoprofen, Oxycodone-aspirin Psychotropic Info: Lexapro, Remeron Insulin Sliding Scale Info: See discharge summary       Current Medications (10/08/2022):  This is the current hospital active medication list Current Facility-Administered Medications  Medication Dose Route Frequency Provider Last Rate Last Admin   acetaminophen (TYLENOL) tablet 650 mg  650 mg Oral Q6H PRN Doutova, Anastassia, MD       Or   acetaminophen (TYLENOL) suppository 650 mg  650 mg Rectal Q6H PRN Doutova, Anastassia, MD       albuterol (PROVENTIL) (2.5 MG/3ML) 0.083% nebulizer solution 2.5 mg  2.5 mg Nebulization Q2H PRN Doutova, Anastassia, MD       atorvastatin (LIPITOR) tablet 80 mg  80 mg Oral Daily Doutova, Anastassia, MD   80 mg at 10/08/22 0817   bisacodyl (DULCOLAX) EC tablet 10 mg  10 mg Oral Daily PRN Rai, Ripudeep K, MD   10 mg at 10/06/22 0945   colchicine tablet 0.6 mg  0.6 mg Oral BID Toy Baker, MD   0.6 mg at 10/08/22 0816   enoxaparin (LOVENOX) injection 40 mg  40 mg Subcutaneous Q24H Mariel Aloe, MD  40 mg at 10/07/22 2144   escitalopram (LEXAPRO) tablet 20 mg  20 mg Oral Daily Rai, Ripudeep K, MD   20 mg at 10/08/22 0817   HYDROcodone-acetaminophen (NORCO/VICODIN) 5-325 MG per tablet 1-2 tablet  1-2 tablet Oral Q4H PRN Toy Baker, MD   1 tablet at 10/06/22 2336   HYDROmorphone (DILAUDID) tablet 1 mg  1 mg Oral Q4H PRN Toy Baker, MD       insulin aspart (novoLOG) injection 0-5 Units  0-5 Units Subcutaneous QHS Toy Baker, MD   2 Units at 10/04/22 2349   insulin aspart (novoLOG) injection 0-9 Units  0-9 Units Subcutaneous TID  WC Rai, Ripudeep K, MD       [START ON 10/09/2022] insulin glargine-yfgn (SEMGLEE) injection 16 Units  16 Units Subcutaneous Daily Rai, Ripudeep K, MD       meropenem (MERREM) 1 g in sodium chloride 0.9 % 100 mL IVPB  1 g Intravenous Q12H Rai, Ripudeep K, MD 200 mL/hr at 10/08/22 0825 1 g at 10/08/22 0825   methocarbamol (ROBAXIN) tablet 500 mg  500 mg Oral Q8H PRN Toy Baker, MD       mirabegron ER (MYRBETRIQ) tablet 25 mg  25 mg Oral Daily Rai, Ripudeep K, MD   25 mg at 10/08/22 0816   mirtazapine (REMERON) tablet 7.5 mg  7.5 mg Oral QHS Rai, Ripudeep K, MD   7.5 mg at 10/07/22 2143   polyethylene glycol (MIRALAX / GLYCOLAX) packet 17 g  17 g Oral Daily Mariel Aloe, MD   17 g at 10/06/22 0813   predniSONE (DELTASONE) tablet 40 mg  40 mg Oral Q breakfast Doutova, Anastassia, MD   40 mg at 10/08/22 0817   senna-docusate (Senokot-S) tablet 1 tablet  1 tablet Oral BID Rai, Ripudeep K, MD   1 tablet at 10/06/22 2153     Discharge Medications: Please see discharge summary for a list of discharge medications.  Relevant Imaging Results:  Relevant Lab Results:   Additional Information SSN: [id number]  Sherie Don, LCSW

## 2022-10-08 NOTE — Progress Notes (Signed)
Chaplain provided a follow up support visit to [name].  Mckenzie Rogers said she was very tired today because she had been given a sleeping pill the night before that she wasn't used to taking.  She has been doing okay and has an understanding about her medical care going forward.  She did not wish to talk further today as she just wanted to rest.  [place], [place] Pager, [phone]

## 2022-10-08 NOTE — TOC Progression Note (Signed)
Transition of Care [facility]) - Progression Note   Patient Details  Name: Mckenzie Rogers MRN: [id number] Date of Birth: 30-Jul-1941  Transition of Care [facility]) CM/SW [place], LCSW Phone Number: 10/08/2022, 1:28 PM  Clinical Narrative: Patient is now agreeable to SNF. FL2 completed; PASRR confirmed. Initial referral faxed out. TOC awaiting bed offers.   Expected Discharge Plan: [place] Barriers to Discharge: Continued Medical Work up, SNF Pending bed offer, Ship broker  Expected Discharge Plan and Services In-house Referral: Clinical Social Work Discharge Planning Services: [employer] Consult Post Acute Care Choice: Home Health, Durable Medical Equipment Living arrangements for the past 2 months: [place]: PT, OT, RN, Nurse's Aide HH Agency: [place] Date [place]: 10/06/22 Time [place]: 1200 Representative spoke with at [place]: [place] Determinants of Health ([place]) Interventions [place]: No Food Insecurity (08/22/2022)  Housing: Low Risk  (08/22/2022)  Transportation Needs: No Transportation Needs (08/22/2022)  Utilities: Not At Risk (08/22/2022)  Tobacco Use: Low Risk  (10/05/2022)   Readmission Risk Interventions    10/07/2022    1:48 PM  Readmission Risk Prevention Plan  Transportation Screening Complete  PCP or Specialist Appt within 5-7 Days Complete  Home Care Screening Complete  Medication Review (RN CM) Complete

## 2022-10-08 NOTE — Inpatient Diabetes Management (Signed)
Inpatient Diabetes Program Recommendations  AACE/ADA: New Consensus Statement on Inpatient Glycemic Control  Target Ranges:  Prepandial:   less than 140 mg/dL      Peak postprandial:   less than 180 mg/dL (1-2 hours)      Critically ill patients:  140 - 180 mg/dL    Latest Reference Range & Units 10/07/22 07:43 10/07/22 11:52 10/07/22 16:11 10/07/22 21:30 10/08/22 07:21 10/08/22 08:06  Glucose-Capillary 70 - 99 mg/dL 88 109 (H) 99 148 (H) 67 (L) 93   Review of Glycemic Control  Diabetes history: DM2 Outpatient Diabetes medications: Farxiga 10 mg daily, Basaglar 28 units daily if CBG over 120 mg/dl Current orders for Inpatient glycemic control: Semglee 20 units daily, Novolog 0-15 units TID with meals, Novolog 0-5 units QHS; Prednisone 40 mg QAM  Inpatient Diabetes Program Recommendations:    Insulin: Fasting glucose 67 mg/dl today. Please consider decreasing Semglee to 16 units daily.  Thanks, Barnie Alderman, RN, MSN, [place] Diabetes Coordinator Inpatient Diabetes Program [phone] (Team Pager from 8am to [place])

## 2022-10-08 NOTE — Progress Notes (Addendum)
[facility]                                                                              Mckenzie Rogers, is a 82 y.o. female, DOB - [birth year], CBJ:[id number] Admit date - 10/04/2022    Outpatient Primary MD for the patient is [name], Fransico Him, MD  LOS - 2  days  Chief Complaint  Patient presents with   Leg Swelling       Brief summary   Mckenzie Rogers is a 82 y.o. female with a history of gout, history of left knee replacement, remote DVT, diabetes mellitus type 2, CKD 2. Patient presented secondary to right ankle swelling and inability to bear weight. Patient started on prednisone and colchicine. PT/OT ordered. While admitted, patient found to have a UTI with history of ESBL.   Assessment & Plan    Principal Problem: Acute on chronic gout with flare, right ankle, right knee -Manages at home on colchicine as needed, no maintenance medication. - started on prednisone, colchicine this admission -ESR 59, CRP 4.1, uric acid 4.0 -Patient had a left total knee arthroplasty on 08/19/2022, during postop.,  She was noted to have right knee pain, had arthrocentesis done, negative for septic arthritis but showed gout crystals.. -s/p arthrocentesis and intra-articular injections in the right knee and ankle by orthopedics today -Continue colchicine for total 5 days.  Continue prednisone, likely will need to taper at discharge  Active Problems:  E. coli UTI -Complained of dysuria and UA + UTI.   -Previous history of ESBL UTI 08/09/2022 -Urine culture positive for ESBL E. coli, discussed with pharmacy, for now continue IV meropenem however Zosyn would still have covered.  Will finish IV antibiotics tomorrow a.m. 1/27   Diabetes mellitus type 2, IDDM, uncontrolled with hyperglycemia -Hemoglobin A1c 6.3 CBG (last 3)  Recent Labs    10/08/22 0721 10/08/22 0806 10/08/22 1104  GLUCAP 67* 93 132*   Decrease Semglee to 16 units daily, decrease sliding scale insulin  to sensitive    Overactive bladder Patient is on Toviaz and Myrbetriq as an outpatient.   Hyperlipidemia -Continue Lipitor   Debility Patient is s/p left total knee arthroplasty in 08/2022. She was previously on Xarelto for DVT prophylaxis which is now discontinued. -Concern for underlying depression, noted that patient was on Wellbutrin and Lexapro however per son she was not taking any medications at home.  He is also concerned about possible depression versus dementia with behavioral issues.  -Continue Lexapro qAM and Remeron 7.5 mg at bedtime  CAPACITY EVALUATION done today: patient does have capacity to make her medical decisions.  She reported that she lives with her disabled son and daughter Mckenzie Rogers.  She is not sure that she was not able to ambulate much in the last week since she has been home from the skilled nursing facility due to ongoing pain in the right knee and right ankle due to gout flare and she had a left knee replacement recently in December 2023.  She is fully alert and oriented x 4 and understands the situation she is in.  She acknowledges that she was not able to  go to her doctors appointments because of the pain and she was apprehensive that she will be in worse pain due to transportation.  She denied being been noncompliant with her medications.  She is agreeable to go to skilled nursing facility however does not want to go to [facility].  Obesity Estimated body mass index is 31.87 kg/m as calculated from the following:   Height as of 09/13/22: 5\' 3"  (1.6 m).   Weight as of this encounter: 81.6 kg.  Code Status: Full code DVT Prophylaxis:  enoxaparin (LOVENOX) injection 40 mg Start: 10/05/22 2200 SCDs Start: 10/05/22 1300   Level of Care: Level of care: Med-Surg Family Communication: Updated patient's son, Mckenzie Leyden [facility]) on phone today Disposition Plan:      Remains inpatient appropriate: she is agreeable for SNF, does not want to go to [facility]F.        Procedures:  Right knee aspiration with intra-articular injection right knee and right ankle 1/26  Consultants:   Orthopedics  Antimicrobials:   Anti-infectives (From admission, onward)    Start     Dose/Rate Route Frequency Ordered Stop   10/07/22 1400  meropenem (MERREM) 1 g in sodium chloride 0.9 % 100 mL IVPB        1 g 200 mL/hr over 30 Minutes Intravenous Every 12 hours 10/07/22 1306 10/09/22 2159   10/04/22 2330  piperacillin-tazobactam (ZOSYN) IVPB 3.375 g  Status:  Discontinued        3.375 g 12.5 mL/hr over 240 Minutes Intravenous Every 8 hours 10/04/22 2259 10/07/22 1258   10/04/22 2200  fosfomycin (MONUROL) packet 3 g        3 g Oral  Once 10/04/22 2152 10/04/22 2250          Medications  atorvastatin  80 mg Oral Daily   colchicine  0.6 mg Oral BID   enoxaparin (LOVENOX) injection  40 mg Subcutaneous Q24H   escitalopram  20 mg Oral Daily   insulin aspart  0-15 Units Subcutaneous TID WC   insulin aspart  0-5 Units Subcutaneous QHS   [START ON 10/09/2022] insulin glargine-yfgn  16 Units Subcutaneous Daily   mirabegron ER  25 mg Oral Daily   mirtazapine  7.5 mg Oral QHS   polyethylene glycol  17 g Oral Daily   predniSONE  40 mg Oral Q breakfast   senna-docusate  1 tablet Oral BID      Subjective:   Mckenzie Rogers was seen and examined today.  Feeling better, had right knee and ankle injected this morning.  Constipation resolved.  No new complaints.  No acute nausea vomiting or abdominal pain.    Objective:   Vitals:   10/07/22 0449 10/07/22 1308 10/07/22 2101 10/08/22 0508  BP: 114/60 (!) 110/59 (!) 97/49 128/71  Pulse: (!) 51 (!) 57 61 (!) 54  Resp: 16 19 20 18   Temp: 97.8 F (36.6 C) 98.2 F (36.8 C) 99.2 F (37.3 C) 98.2 F (36.8 C)  TempSrc: Oral Oral Oral Oral  SpO2: 100% 96% 99% 97%  Weight:        Intake/Output Summary (Last 24 hours) at 10/08/2022 1248 Last data filed at 10/07/2022 2012 Gross per 24 hour  Intake 340 ml   Output 300 ml  Net 40 ml     Wt Readings from Last 3 Encounters:  10/04/22 81.6 kg  09/13/22 81.6 kg  08/19/22 81.6 kg    Physical Exam General: Alert and oriented x 3, NAD Cardiovascular: S1 S2  clear, RRR.  Respiratory: CTAB, no wheezing, rales or rhonchi Gastrointestinal: Soft, nontender, nondistended, NBS Ext: no pedal edema bilaterally Neuro: no new deficits psych: Normal affect and demeanor, alert and oriented x3     Data Reviewed:  I have personally reviewed following labs    CBC Lab Results  Component Value Date   WBC 3.2 (L) 10/05/2022   RBC 4.29 10/05/2022   HGB 12.8 10/05/2022   HCT 40.9 10/05/2022   MCV 95.3 10/05/2022   MCH 29.8 10/05/2022   PLT 205 10/05/2022   MCHC 31.3 10/05/2022   RDW 15.0 10/05/2022   LYMPHSABS 2.0 10/04/2022   MONOABS 0.5 10/04/2022   EOSABS 0.2 10/04/2022   BASOSABS 0.0 10/04/2022     Last metabolic panel Lab Results  Component Value Date   NA 136 10/07/2022   K 3.7 10/07/2022   CL 103 10/07/2022   CO2 23 10/07/2022   BUN 20 10/07/2022   CREATININE 0.99 10/07/2022   GLUCOSE 89 10/07/2022   GFRNONAA 58 (L) 10/07/2022   CALCIUM 8.7 (L) 10/07/2022   PHOS 3.7 10/05/2022   PROT 8.3 (H) 10/05/2022   ALBUMIN 3.2 (L) 10/05/2022   BILITOT 0.7 10/05/2022   ALKPHOS 61 10/05/2022   AST 37 10/05/2022   ALT 30 10/05/2022   ANIONGAP 10 10/07/2022    CBG (last 3)  Recent Labs    10/08/22 0721 10/08/22 0806 10/08/22 1104  GLUCAP 67* 93 132*      Coagulation Profile: No results for input(s): "INR", "PROTIME" in the last 168 hours.   Radiology Studies: I have personally reviewed the imaging studies  No results found.     Thad Ranger M.D. [facility] 10/08/2022, 12:48 PM  Available via Epic secure chat 7am-7pm After 7 pm, please refer to night coverage provider listed on amion.

## 2022-10-09 DIAGNOSIS — N3000 Acute cystitis without hematuria: Secondary | ICD-10-CM | POA: Diagnosis not present

## 2022-10-09 DIAGNOSIS — R5381 Other malaise: Secondary | ICD-10-CM | POA: Diagnosis not present

## 2022-10-09 DIAGNOSIS — G8929 Other chronic pain: Secondary | ICD-10-CM | POA: Diagnosis not present

## 2022-10-09 DIAGNOSIS — M25571 Pain in right ankle and joints of right foot: Secondary | ICD-10-CM | POA: Diagnosis not present

## 2022-10-09 LAB — GLUCOSE, CAPILLARY
Glucose-Capillary: 155 mg/dL — ABNORMAL HIGH (ref 70–99)
Glucose-Capillary: 142 mg/dL — ABNORMAL HIGH (ref 70–99)
Glucose-Capillary: 221 mg/dL — ABNORMAL HIGH (ref 70–99)
Glucose-Capillary: 173 mg/dL — ABNORMAL HIGH (ref 70–99)

## 2022-10-09 MED ORDER — INSULIN ASPART 100 UNIT/ML IJ SOLN
3.0000 [IU] | Freq: Three times a day (TID) | INTRAMUSCULAR | Status: DC
  Administered 2022-10-09 – 2022-10-11 (×6): 3 [IU] via SUBCUTANEOUS

## 2022-10-09 MED ORDER — PREDNISONE 20 MG PO TABS
30.0000 mg | ORAL_TABLET | Freq: Every day | ORAL | Status: DC
  Administered 2022-10-10 – 2022-10-12 (×3): 30 mg via ORAL
  Filled 2022-10-09 (×3): qty 1

## 2022-10-09 NOTE — Progress Notes (Signed)
[facility]                                                                              Mckenzie Rogers, is a 82 y.o. female, DOB - 11/03/1940, IRC:[id number] Admit date - 10/04/2022    Outpatient Primary MD for the patient is [name], Fransico Him, MD  LOS - 3  days  Chief Complaint  Patient presents with   Leg Swelling       Brief summary   Mckenzie Rogers is a 82 y.o. female with a history of gout, history of left knee replacement, remote DVT, diabetes mellitus type 2, CKD 2. Patient presented secondary to right ankle swelling and inability to bear weight. Patient started on prednisone and colchicine. PT/OT ordered. While admitted, patient found to have a UTI with history of ESBL.   Assessment & Plan    Principal Problem: Acute on chronic gout with flare, right ankle, right knee -Manages at home on colchicine as needed, no maintenance medication. - started on prednisone, colchicine this admission -ESR 59, CRP 4.1, uric acid 4.0 -Patient had a left total knee arthroplasty on 08/19/2022, during postop.,  She was noted to have right knee pain, had arthrocentesis done, negative for septic arthritis but showed gout crystals.. -s/p arthrocentesis and intra-articular injections in the right knee and ankle by orthopedics on 1/26 -Continue colchicine for total 5 days.   -Completed prednisone 40 mg daily for 5 days, will place on taper to 30 mg daily tomorrow  Active Problems:  E. coli UTI -Complained of dysuria and UA + UTI.   -Previous history of ESBL UTI 08/09/2022 -Urine culture positive for ESBL E. coli, completed antibiotics  x 5days this morning    Diabetes mellitus type 2, IDDM, uncontrolled with hyperglycemia -Hemoglobin A1c 6.3 CBG (last 3)  Recent Labs    10/08/22 1700 10/08/22 2005 10/09/22 0739  GLUCAP 248* 242* 155*   Continue Semglee 16 units daily, sliding scale insulin sensitive, add meal coverage 3 units 3 times daily AC    Overactive  bladder Patient is on Toviaz and Myrbetriq as an outpatient.   Hyperlipidemia -Continue Lipitor   Debility Patient is s/p left total knee arthroplasty in 08/2022. She was previously on Xarelto for DVT prophylaxis which is now discontinued. -Concern for underlying depression, noted that patient was on Wellbutrin and Lexapro however per son she was not taking any medications at home.  He is also concerned about possible depression versus dementia with behavioral issues.  -Continue Lexapro qAM and Remeron 7.5 mg at bedtime  CAPACITY EVALUATION done on 1/26 - patient does have capacity to make her medical decisions.  She reported that she lives with her disabled son and daughter Butch Penny.  She is not sure that she was not able to ambulate much in the last week since she has been home from the skilled nursing facility due to ongoing pain in the right knee and right ankle due to gout flare and she had a left knee replacement recently in December 2023.  She is fully alert and oriented x 4 and understands the situation she is in.  She acknowledges that she  was not able to go to her doctors appointments because of the pain and she was apprehensive that she will be in worse pain due to transportation.  She denied being been noncompliant with her medications.  - She is agreeable to go to skilled nursing facility however does not want to go to [facility] SNF.  Obesity Estimated body mass index is 31.87 kg/m as calculated from the following:   Height as of 09/13/22: 5\' 3"  (1.6 m).   Weight as of this encounter: 81.6 kg.  Code Status: Full code DVT Prophylaxis:  enoxaparin (LOVENOX) injection 40 mg Start: 10/05/22 2200 SCDs Start: 10/05/22 1300   Level of Care: Level of care: Med-Surg Family Communication: Updated patient's son, Lurlean Leyden [facility]) on 1/26 Disposition Plan:      Remains inpatient appropriate: she is agreeable for SNF, does not want to go to [facility] SNF.  DC to SNF when bed  available  Procedures:  Right knee aspiration with intra-articular injection right knee and right ankle 1/26  Consultants:   Orthopedics  Antimicrobials:   Anti-infectives (From admission, onward)    Start     Dose/Rate Route Frequency Ordered Stop   10/07/22 1400  meropenem (MERREM) 1 g in sodium chloride 0.9 % 100 mL IVPB        1 g 200 mL/hr over 30 Minutes Intravenous Every 12 hours 10/07/22 1306 10/09/22 0950   10/04/22 2330  piperacillin-tazobactam (ZOSYN) IVPB 3.375 g  Status:  Discontinued        3.375 g 12.5 mL/hr over 240 Minutes Intravenous Every 8 hours 10/04/22 2259 10/07/22 1258   10/04/22 2200  fosfomycin (MONUROL) packet 3 g        3 g Oral  Once 10/04/22 2152 10/04/22 2250          Medications  atorvastatin  80 mg Oral Daily   colchicine  0.6 mg Oral BID   enoxaparin (LOVENOX) injection  40 mg Subcutaneous Q24H   escitalopram  20 mg Oral Daily   insulin aspart  0-5 Units Subcutaneous QHS   insulin aspart  0-9 Units Subcutaneous TID WC   insulin glargine-yfgn  16 Units Subcutaneous Daily   mirabegron ER  25 mg Oral Daily   mirtazapine  7.5 mg Oral QHS   polyethylene glycol  17 g Oral Daily   senna-docusate  1 tablet Oral BID      Subjective:   Di Jasmer was seen and examined today.  No complaints, feels that her right knee and ankle is now improving.  Constipation resolved.  No nausea vomiting or abdominal pain.      Objective:   Vitals:   10/08/22 0508 10/08/22 1300 10/08/22 2002 10/09/22 0413  BP: 128/71 123/61 (!) 121/59 116/70  Pulse: (!) 54 63 (!) 59 (!) 55  Resp: 18 16 14 16   Temp: 98.2 F (36.8 C) 98.1 F (36.7 C) 98 F (36.7 C) 98.3 F (36.8 C)  TempSrc: Oral Oral    SpO2: 97% 100% 98% 100%  Weight:        Intake/Output Summary (Last 24 hours) at 10/09/2022 1159 Last data filed at 10/09/2022 1030 Gross per 24 hour  Intake 195 ml  Output --  Net 195 ml     Wt Readings from Last 3 Encounters:  10/04/22 81.6 kg   09/13/22 81.6 kg  08/19/22 81.6 kg   Physical Exam General: Alert and oriented x 3, NAD Cardiovascular: S1 S2 clear, RRR.  Respiratory: CTAB, no wheezing Gastrointestinal: Soft, nontender,  nondistended, NBS Ext: no pedal edema bilaterally Neuro: no new deficits Skin: No rashes Psych: Normal affect   Data Reviewed:  I have personally reviewed following labs    CBC Lab Results  Component Value Date   WBC 3.2 (L) 10/05/2022   RBC 4.29 10/05/2022   HGB 12.8 10/05/2022   HCT 40.9 10/05/2022   MCV 95.3 10/05/2022   MCH 29.8 10/05/2022   PLT 205 10/05/2022   MCHC 31.3 10/05/2022   RDW 15.0 10/05/2022   LYMPHSABS 2.0 10/04/2022   MONOABS 0.5 10/04/2022   EOSABS 0.2 10/04/2022   BASOSABS 0.0 10/04/2022     Last metabolic panel Lab Results  Component Value Date   NA 136 10/07/2022   K 3.7 10/07/2022   CL 103 10/07/2022   CO2 23 10/07/2022   BUN 20 10/07/2022   CREATININE 0.99 10/07/2022   GLUCOSE 89 10/07/2022   GFRNONAA 58 (L) 10/07/2022   CALCIUM 8.7 (L) 10/07/2022   PHOS 3.7 10/05/2022   PROT 8.3 (H) 10/05/2022   ALBUMIN 3.2 (L) 10/05/2022   BILITOT 0.7 10/05/2022   ALKPHOS 61 10/05/2022   AST 37 10/05/2022   ALT 30 10/05/2022   ANIONGAP 10 10/07/2022    CBG (last 3)  Recent Labs    10/08/22 1700 10/08/22 2005 10/09/22 0739  GLUCAP 248* 242* 155*      Coagulation Profile: No results for input(s): "INR", "PROTIME" in the last 168 hours.   Radiology Studies: I have personally reviewed the imaging studies  No results found.     Thad Ranger M.D. [facility] 10/09/2022, 11:59 AM  Available via Epic secure chat 7am-7pm After 7 pm, please refer to night coverage provider listed on amion.

## 2022-10-10 DIAGNOSIS — N3000 Acute cystitis without hematuria: Secondary | ICD-10-CM | POA: Diagnosis not present

## 2022-10-10 DIAGNOSIS — R5381 Other malaise: Secondary | ICD-10-CM | POA: Diagnosis not present

## 2022-10-10 DIAGNOSIS — E119 Type 2 diabetes mellitus without complications: Secondary | ICD-10-CM | POA: Diagnosis not present

## 2022-10-10 DIAGNOSIS — M25571 Pain in right ankle and joints of right foot: Secondary | ICD-10-CM | POA: Diagnosis not present

## 2022-10-10 LAB — GLUCOSE, CAPILLARY
Glucose-Capillary: 113 mg/dL — ABNORMAL HIGH (ref 70–99)
Glucose-Capillary: 127 mg/dL — ABNORMAL HIGH (ref 70–99)
Glucose-Capillary: 171 mg/dL — ABNORMAL HIGH (ref 70–99)
Glucose-Capillary: 214 mg/dL — ABNORMAL HIGH (ref 70–99)

## 2022-10-10 NOTE — Progress Notes (Signed)
[facility]                                                                              Mckenzie Rogers, is a 82 y.o. female, DOB - [birth year], ACZ:[id number] Admit date - 10/04/2022    Outpatient Primary MD for the patient is [name], Fransico Him, MD  LOS - 4  days  Chief Complaint  Patient presents with   Leg Swelling       Brief summary   Mckenzie Rogers is a 82 y.o. female with a history of gout, history of left knee replacement, remote DVT, diabetes mellitus type 2, CKD 2. Patient presented secondary to right ankle swelling and inability to bear weight. Patient started on prednisone and colchicine. PT/OT ordered. While admitted, patient found to have a UTI with history of ESBL.   Assessment & Plan    Principal Problem: Acute on chronic gout with flare, right ankle, right knee -Manages at home on colchicine as needed, no maintenance medication. - started on prednisone, colchicine this admission -ESR 59, CRP 4.1, uric acid 4.0 -Patient had a left total knee arthroplasty on 08/19/2022, during postop.,  She was noted to have right knee pain, had arthrocentesis done, negative for septic arthritis but showed gout crystals.. -s/p arthrocentesis and intra-articular injections in the right knee and ankle by orthopedics on 1/26 -Continue colchicine.  Tapered prednisone to 30 mg daily today  Active Problems:  E. coli UTI -Complained of dysuria and UA + UTI.   -Previous history of ESBL UTI 08/09/2022 -Urine culture positive for ESBL E. coli, completed antibiotics  x 5days this morning    Diabetes mellitus type 2, IDDM, uncontrolled with hyperglycemia -Hemoglobin A1c 6.3 CBG (last 3)  Recent Labs    10/09/22 2106 10/10/22 0735 10/10/22 1157  GLUCAP 173* 113* 127*    Continue Semglee 16 units daily, sliding scale insulin sensitive, add meal coverage 3 units 3 times daily AC    Overactive bladder Patient is on Toviaz and Myrbetriq as an outpatient.    Hyperlipidemia -Continue Lipitor   Debility Patient is s/p left total knee arthroplasty in 08/2022. She was previously on Xarelto for DVT prophylaxis which is now discontinued. -Concern for underlying depression, noted that patient was on Wellbutrin and Lexapro however per son she was not taking any medications at home.  He is also concerned about possible depression versus dementia with behavioral issues.  -Continue Lexapro qAM and Remeron 7.5 mg at bedtime  CAPACITY EVALUATION done on 1/26 - patient does have capacity to make her medical decisions.  She reported that she lives with her disabled son and daughter Butch Penny.  She is not sure that she was not able to ambulate much in the last week since she has been home from the skilled nursing facility due to ongoing pain in the right knee and right ankle due to gout flare and she had a left knee replacement recently in December 2023.  She is fully alert and oriented x 4 and understands the situation she is in.  She acknowledges that she was not able to go to her doctors appointments because of the pain and  she was apprehensive that she will be in worse pain due to transportation.  She denied being been noncompliant with her medications.  - She is agreeable to go to skilled nursing facility however does not want to go to [facility] SNF.  Obesity Estimated body mass index is 31.87 kg/m as calculated from the following:   Height as of 09/13/22: 5\' 3"  (1.6 m).   Weight as of this encounter: 81.6 kg.  Code Status: Full code DVT Prophylaxis:  enoxaparin (LOVENOX) injection 40 mg Start: 10/05/22 2200 SCDs Start: 10/05/22 1300   Level of Care: Level of care: Med-Surg Family Communication: Updated patient's son, 10/07/22 [facility]) on 1/26 Disposition Plan:      Remains inpatient appropriate: she is agreeable for SNF, does not want to go to [facility] SNF.  DC to SNF when bed available  Procedures:  Right knee aspiration with intra-articular injection right  knee and right ankle 1/26  Consultants:   Orthopedics  Antimicrobials:   Anti-infectives (From admission, onward)    Start     Dose/Rate Route Frequency Ordered Stop   10/07/22 1400  meropenem (MERREM) 1 g in sodium chloride 0.9 % 100 mL IVPB        1 g 200 mL/hr over 30 Minutes Intravenous Every 12 hours 10/07/22 1306 10/09/22 0950   10/04/22 2330  piperacillin-tazobactam (ZOSYN) IVPB 3.375 g  Status:  Discontinued        3.375 g 12.5 mL/hr over 240 Minutes Intravenous Every 8 hours 10/04/22 2259 10/07/22 1258   10/04/22 2200  fosfomycin (MONUROL) packet 3 g        3 g Oral  Once 10/04/22 2152 10/04/22 2250          Medications  atorvastatin  80 mg Oral Daily   colchicine  0.6 mg Oral BID   enoxaparin (LOVENOX) injection  40 mg Subcutaneous Q24H   escitalopram  20 mg Oral Daily   insulin aspart  0-5 Units Subcutaneous QHS   insulin aspart  0-9 Units Subcutaneous TID WC   insulin aspart  3 Units Subcutaneous TID WC   insulin glargine-yfgn  16 Units Subcutaneous Daily   mirabegron ER  25 mg Oral Daily   mirtazapine  7.5 mg Oral QHS   polyethylene glycol  17 g Oral Daily   predniSONE  30 mg Oral Q breakfast   senna-docusate  1 tablet Oral BID      Subjective:   Tambra Muller was seen and examined today.  No acute complaints.  States right knee and ankle pain is now improving.  Constipation resolved. Objective:   Vitals:   10/09/22 1233 10/09/22 2053 10/10/22 0529 10/10/22 1158  BP: (!) 123/52 122/62 116/78 123/62  Pulse: 63 (!) 58 (!) 49 (!) 51  Resp: 18 18 18 18   Temp: 98.3 F (36.8 C) 98.5 F (36.9 C) 97.8 F (36.6 C) 98 F (36.7 C)  TempSrc:  Oral Oral Oral  SpO2: 99% 100% 99% 100%  Weight:        Intake/Output Summary (Last 24 hours) at 10/10/2022 1316 Last data filed at 10/10/2022 0930 Gross per 24 hour  Intake 240 ml  Output --  Net 240 ml     Wt Readings from Last 3 Encounters:  10/04/22 81.6 kg  09/13/22 81.6 kg  08/19/22 81.6 kg     Physical Exam General: Alert and oriented x 3, NAD Cardiovascular: S1 S2 clear, RRR.  Respiratory: CTAB Gastrointestinal: Soft, nontender, nondistended, NBS Ext: no pedal edema bilaterally  Neuro: no new deficits Skin: No rashes Psych: Normal affect    Data Reviewed:  I have personally reviewed following labs    CBC Lab Results  Component Value Date   WBC 3.2 (L) 10/05/2022   RBC 4.29 10/05/2022   HGB 12.8 10/05/2022   HCT 40.9 10/05/2022   MCV 95.3 10/05/2022   MCH 29.8 10/05/2022   PLT 205 10/05/2022   MCHC 31.3 10/05/2022   RDW 15.0 10/05/2022   LYMPHSABS 2.0 10/04/2022   MONOABS 0.5 10/04/2022   EOSABS 0.2 10/04/2022   BASOSABS 0.0 07/07/8526     Last metabolic panel Lab Results  Component Value Date   NA 136 10/07/2022   K 3.7 10/07/2022   CL 103 10/07/2022   CO2 23 10/07/2022   BUN 20 10/07/2022   CREATININE 0.99 10/07/2022   GLUCOSE 89 10/07/2022   GFRNONAA 58 (L) 10/07/2022   CALCIUM 8.7 (L) 10/07/2022   PHOS 3.7 10/05/2022   PROT 8.3 (H) 10/05/2022   ALBUMIN 3.2 (L) 10/05/2022   BILITOT 0.7 10/05/2022   ALKPHOS 61 10/05/2022   AST 37 10/05/2022   ALT 30 10/05/2022   ANIONGAP 10 10/07/2022    CBG (last 3)  Recent Labs    10/09/22 2106 10/10/22 0735 10/10/22 1157  GLUCAP 173* 113* 127*      Coagulation Profile: No results for input(s): "INR", "PROTIME" in the last 168 hours.   Radiology Studies: I have personally reviewed the imaging studies  No results found.     Estill Cotta M.D. [facility] 10/10/2022, 1:16 PM  Available via Epic secure chat 7am-7pm After 7 pm, please refer to night coverage provider listed on amion.

## 2022-10-11 DIAGNOSIS — E119 Type 2 diabetes mellitus without complications: Secondary | ICD-10-CM | POA: Diagnosis not present

## 2022-10-11 DIAGNOSIS — R5381 Other malaise: Secondary | ICD-10-CM | POA: Diagnosis not present

## 2022-10-11 DIAGNOSIS — N3000 Acute cystitis without hematuria: Secondary | ICD-10-CM | POA: Diagnosis not present

## 2022-10-11 DIAGNOSIS — M25571 Pain in right ankle and joints of right foot: Secondary | ICD-10-CM | POA: Diagnosis not present

## 2022-10-11 LAB — GLUCOSE, CAPILLARY
Glucose-Capillary: 106 mg/dL — ABNORMAL HIGH (ref 70–99)
Glucose-Capillary: 132 mg/dL — ABNORMAL HIGH (ref 70–99)
Glucose-Capillary: 195 mg/dL — ABNORMAL HIGH (ref 70–99)
Glucose-Capillary: 176 mg/dL — ABNORMAL HIGH (ref 70–99)

## 2022-10-11 LAB — BODY FLUID CULTURE W GRAM STAIN
Culture: NO GROWTH
Special Requests: NORMAL

## 2022-10-11 MED ORDER — HYDROMORPHONE HCL 2 MG PO TABS: 1.0000 mg | ORAL_TABLET | Freq: Four times a day (QID) | ORAL | 0 refills | Status: AC | PRN

## 2022-10-11 MED ORDER — BASAGLAR KWIKPEN 100 UNIT/ML ~~LOC~~ SOPN: 16.0000 [IU] | PEN_INJECTOR | Freq: Every day | SUBCUTANEOUS | Status: AC

## 2022-10-11 MED ORDER — INSULIN ASPART 100 UNIT/ML IJ SOLN: 0.0000 [IU] | Freq: Three times a day (TID) | INTRAMUSCULAR | 11 refills | Status: AC

## 2022-10-11 MED ORDER — PREDNISONE 10 MG PO TABS: ORAL_TABLET | ORAL | 0 refills | Status: AC

## 2022-10-11 MED ORDER — COLCHICINE 0.6 MG PO TABS: 0.6000 mg | ORAL_TABLET | Freq: Two times a day (BID) | ORAL | 0 refills | Status: AC

## 2022-10-11 MED ORDER — POLYETHYLENE GLYCOL 3350 17 G PO PACK: 17.0000 g | PACK | Freq: Every day | ORAL | 0 refills | Status: AC | PRN

## 2022-10-11 MED ORDER — MIRTAZAPINE 7.5 MG PO TABS: 7.5000 mg | ORAL_TABLET | Freq: Every day | ORAL | Status: AC

## 2022-10-11 MED ORDER — BISACODYL 5 MG PO TBEC: 10.0000 mg | DELAYED_RELEASE_TABLET | Freq: Every day | ORAL | 0 refills | Status: AC | PRN

## 2022-10-11 NOTE — TOC Progression Note (Addendum)
Transition of Care [facility]) - Progression Note    Patient Details  Name: Mckenzie Rogers MRN: [id number] Date of Birth: 1941-06-29  Transition of Care [facility]) CM/SW [place], LCSW Phone Number: 10/11/2022, 1:00 PM  Clinical Narrative:    Reviewed bed offer for SNF with pt. Pt unsatisfied with SNF choice, however does not have other bed offers. Pt accepted bed offer for [facility]. Need to start insurance authorization once updated PT eval has been complete. PT notified.   Update 2:30pm- Insurance Josem Kaufmann has been requested for SNF placement. Currently auth is pending approval.   Expected Discharge Plan: [place] Barriers to Discharge: Continued Medical Work up, SNF Pending bed offer, Ship broker  Expected Discharge Plan and Services In-house Referral: Clinical Social Work Discharge Planning Services: [employer] Consult Post Acute Care Choice: Home Health, [place] arrangements for the past 2 months: [place]: PT, OT, RN, Nurse's Aide HH Agency: [place] Date [place]: 10/06/22 Time [place]: 1200 Representative spoke with at [place]: [place] Determinants of Health ([place]) Interventions [place]: No Food Insecurity (08/22/2022)  Housing: Low Risk  (08/22/2022)  Transportation Needs: No Transportation Needs (08/22/2022)  Utilities: Not At Risk (08/22/2022)  Tobacco Use: Low Risk  (10/05/2022)    Readmission Risk Interventions    10/11/2022    1:00 PM 10/07/2022    1:48 PM  Readmission Risk Prevention Plan  Post Dischage Appt Complete   Medication Screening Complete   Transportation Screening Complete Complete  PCP or Specialist Appt within 5-7 Days  Complete  Home Care Screening  Complete  Medication Review (RN CM)  Complete

## 2022-10-11 NOTE — Care Management Important Message (Signed)
Important Message  Patient Details IM Letter given. Name: KAMAIYAH USELTON MRN: [id number] Date of Birth: [birth year]   Medicare Important Message Given:  Yes     Kerin Salen 10/11/2022, 12:18 PM

## 2022-10-11 NOTE — Progress Notes (Signed)
[facility]                                                                              Mckenzie Rogers, is a 82 y.o. female, DOB - 28-Apr-1941, XBM:[id number] Admit date - 10/04/2022    Outpatient Primary MD for the patient is [name], [name], MD  LOS - 5  days  Chief Complaint  Patient presents with   Leg Swelling       Brief summary   Mckenzie Rogers is a 82 y.o. female with a history of gout, history of left knee replacement, remote DVT, diabetes mellitus type 2, CKD 2. Patient presented secondary to right ankle swelling and inability to bear weight. Patient started on prednisone and colchicine. PT/OT ordered. While admitted, patient found to have a UTI with history of ESBL.   Assessment & Plan    Principal Problem: Acute on chronic gout with flare, right ankle, right knee -Manages at home on colchicine as needed, no maintenance medication. - started on prednisone, colchicine this admission -ESR 59, CRP 4.1, uric acid 4.0 -Patient had a left total knee arthroplasty on 08/19/2022, during postop.,  She was noted to have right knee pain, had arthrocentesis done, negative for septic arthritis but showed gout crystals.. -s/p arthrocentesis and intra-articular injections in the right knee and ankle by orthopedics on 1/26 -No significant pain, DC colchicine -Continue prednisone with taper -Repeat PT OT evaluation  Active Problems:  E. coli UTI -Complained of dysuria and UA + UTI.   -Previous history of ESBL UTI 08/09/2022 -Urine culture positive for ESBL E. coli, completed antibiotics  x 5days on 10/10/2022   Diabetes mellitus type 2, IDDM, uncontrolled with hyperglycemia -Hemoglobin A1c 6.3 CBG (last 3)  Recent Labs    10/10/22 2055 10/11/22 0717 10/11/22 1117  GLUCAP 214* 106* 132*   Continue Semglee 16 units daily, sensitive SSI, continue meal coverage 3 units 3 times daily AC    Overactive bladder Patient is on Toviaz and Myrbetriq as an  outpatient.   Hyperlipidemia -Continue Lipitor   Debility Patient is s/p left total knee arthroplasty in 08/2022. She was previously on Xarelto for DVT prophylaxis which is now discontinued. -Concern for underlying depression, noted that patient was on Wellbutrin and Lexapro however per son she was not taking any medications at home.  He is also concerned about possible depression versus dementia with behavioral issues.  -Continue Lexapro qAM and Remeron 7.5 mg at bedtime  CAPACITY EVALUATION done on 1/26 - patient does have capacity to make her medical decisions.  She reported that she lives with her disabled son and daughter Mckenzie Rogers.  She is not sure that she was not able to ambulate much in the last week since she has been home from the skilled nursing facility due to ongoing pain in the right knee and right ankle due to gout flare and she had a left knee replacement recently in December 2023.  She is fully alert and oriented x 4 and understands the situation she is in.  She acknowledges that she was not able to go to her doctors appointments because of the pain and she  was apprehensive that she will be in worse pain due to transportation.  She denied being been noncompliant with her medications.  - She is agreeable to go to skilled nursing facility  Obesity Estimated body mass index is 31.87 kg/m as calculated from the following:   Height as of 09/13/22: 5\' 3"  (1.6 m).   Weight as of this encounter: 81.6 kg.  Code Status: Full code DVT Prophylaxis:  enoxaparin (LOVENOX) injection 40 mg Start: 10/05/22 2200 SCDs Start: 10/05/22 1300   Level of Care: Level of care: Med-Surg Family Communication: Updated patient's son, Mckenzie Rogers) on 1/26 Disposition Plan:      Remains inpatient appropriate: she is agreeable for SNF, does not want to go to [facility].  DC to SNF when bed available, medically stable for discharge  Procedures:  Right knee aspiration with intra-articular injection  right knee and right ankle 1/26  Consultants:   Orthopedics  Antimicrobials:   Anti-infectives (From admission, onward)    Start     Dose/Rate Route Frequency Ordered Stop   10/07/22 1400  meropenem (MERREM) 1 g in sodium chloride 0.9 % 100 mL IVPB        1 g 200 mL/hr over 30 Minutes Intravenous Every 12 hours 10/07/22 1306 10/09/22 0950   10/04/22 2330  piperacillin-tazobactam (ZOSYN) IVPB 3.375 g  Status:  Discontinued        3.375 g 12.5 mL/hr over 240 Minutes Intravenous Every 8 hours 10/04/22 2259 10/07/22 1258   10/04/22 2200  fosfomycin (MONUROL) packet 3 g        3 g Oral  Once 10/04/22 2152 10/04/22 2250          Medications  atorvastatin  80 mg Oral Daily   enoxaparin (LOVENOX) injection  40 mg Subcutaneous Q24H   escitalopram  20 mg Oral Daily   insulin aspart  0-5 Units Subcutaneous QHS   insulin aspart  0-9 Units Subcutaneous TID WC   insulin aspart  3 Units Subcutaneous TID WC   insulin glargine-yfgn  16 Units Subcutaneous Daily   mirabegron ER  25 mg Oral Daily   mirtazapine  7.5 mg Oral QHS   polyethylene glycol  17 g Oral Daily   predniSONE  30 mg Oral Q breakfast   senna-docusate  1 tablet Oral BID      Subjective:   Mckenzie Rogers was seen and examined today.  No acute complaints.  Pain resolved in the right knee and right ankle.  Sleeping well, constipation resolved. Awaiting SNF Objective:   Vitals:   10/10/22 0529 10/10/22 1158 10/10/22 2057 10/11/22 0600  BP: 116/78 123/62 (!) 117/57 123/63  Pulse: (!) 49 (!) 51 (!) 57 (!) 55  Resp: 18 18 20 16   Temp: 97.8 F (36.6 C) 98 F (36.7 C) 98.4 F (36.9 C) 97.7 F (36.5 C)  TempSrc: Oral Oral Oral Oral  SpO2: 99% 100% 96% 98%  Weight:        Intake/Output Summary (Last 24 hours) at 10/11/2022 1232 Last data filed at 10/11/2022 0945 Gross per 24 hour  Intake 680 ml  Output 1100 ml  Net -420 ml     Wt Readings from Last 3 Encounters:  10/04/22 81.6 kg  09/13/22 81.6 kg   08/19/22 81.6 kg   Physical Exam General: Alert and oriented x 3, NAD Cardiovascular: S1 S2 clear, RRR.  Respiratory: CTAB Gastrointestinal: Soft, NT, ND, NBS  Ext: no pedal edema bilaterally Neuro: no new deficits Skin: No rashes Psych:  Normal affect    Data Reviewed:  I have personally reviewed following labs    CBC Lab Results  Component Value Date   WBC 3.2 (L) 10/05/2022   RBC 4.29 10/05/2022   HGB 12.8 10/05/2022   HCT 40.9 10/05/2022   MCV 95.3 10/05/2022   MCH 29.8 10/05/2022   PLT 205 10/05/2022   MCHC 31.3 10/05/2022   RDW 15.0 10/05/2022   LYMPHSABS 2.0 10/04/2022   MONOABS 0.5 10/04/2022   EOSABS 0.2 10/04/2022   BASOSABS 0.0 85/10/7739     Last metabolic panel Lab Results  Component Value Date   NA 136 10/07/2022   K 3.7 10/07/2022   CL 103 10/07/2022   CO2 23 10/07/2022   BUN 20 10/07/2022   CREATININE 0.99 10/07/2022   GLUCOSE 89 10/07/2022   GFRNONAA 58 (L) 10/07/2022   CALCIUM 8.7 (L) 10/07/2022   PHOS 3.7 10/05/2022   PROT 8.3 (H) 10/05/2022   ALBUMIN 3.2 (L) 10/05/2022   BILITOT 0.7 10/05/2022   ALKPHOS 61 10/05/2022   AST 37 10/05/2022   ALT 30 10/05/2022   ANIONGAP 10 10/07/2022    CBG (last 3)  Recent Labs    10/10/22 2055 10/11/22 0717 10/11/22 1117  GLUCAP 214* 106* 132*      Coagulation Profile: No results for input(s): "INR", "PROTIME" in the last 168 hours.   Radiology Studies: I have personally reviewed the imaging studies  No results found.     Estill Cotta M.D. [facility] 10/11/2022, 12:32 PM  Available via Epic secure chat 7am-7pm After 7 pm, please refer to night coverage provider listed on amion.

## 2022-10-11 NOTE — Plan of Care (Signed)

## 2022-10-11 NOTE — Progress Notes (Signed)
Physical Therapy Treatment Patient Details Name: Mckenzie Rogers MRN: [id number] DOB: 02-16-1941 Today's Date: 10/11/2022   History of Present Illness Patient is a 82 year old female who Developed severe gout in her right ankle unable to bare weight  on 10/04/22.  Pt s/p joint aspiration R knee and cortisone injection R knee and R ankle 10/06/22  PMHx: OA (b knees), DM, L TKA 12/7    PT Comments    Pt making significant improvement today with decreased pain.  She was able to transfer with min-mod A for transfers and ambulated 15' with min A chair follow. Continue to progress as able.    Recommendations for follow up therapy are one component of a multi-disciplinary discharge planning process, led by the attending physician.  Recommendations may be updated based on patient status, additional functional criteria and insurance authorization.  Follow Up Recommendations  Skilled nursing-short term rehab (<3 hours/day) Can patient physically be transported by private vehicle: Yes   Assistance Recommended at Discharge Frequent or constant Supervision/Assistance  Patient can return home with the following Assistance with cooking/housework;Assist for transportation;Help with stairs or ramp for entrance;A lot of help with bathing/dressing/bathroom;A lot of help with walking and/or transfers   Equipment Recommendations  None recommended by PT    Recommendations for Other Services       Precautions / Restrictions Precautions Precautions: Fall Precaution Comments: recent TKA 12/7 LLE     Mobility  Bed Mobility Overal bed mobility: Needs Assistance Bed Mobility: Supine to Sit     Supine to sit: Supervision     General bed mobility comments: Increased time    Transfers Overall transfer level: Needs assistance Equipment used: Rolling walker (2 wheels), Ambulation equipment used Transfers: Sit to/from Stand Sit to Stand: Min assist, Mod assist           General transfer comment:  Started with sit to stand into STEDY with bed elevated and min A.  Progressed to sit to stand from chair with RW and mod A with feet blocked.  Had assist of 2 for safety based on last session but could do with assist of 1    Ambulation/Gait Ambulation/Gait assistance: Min assist Gait Distance (Feet): 15 Feet Assistive device: Rolling walker (2 wheels) Gait Pattern/deviations: Step-to pattern, Decreased stride length Gait velocity: decreased     General Gait Details: chair follow; cues for RW and posture   Stairs             Wheelchair Mobility    Modified Rankin (Stroke Patients Only)       Balance Overall balance assessment: Needs assistance Sitting-balance support: Feet supported Sitting balance-Leahy Scale: Good     Standing balance support: Reliant on assistive device for balance, Bilateral upper extremity supported Standing balance-Leahy Scale: Poor Standing balance comment: Requiring RW and min guard                            Cognition Arousal/Alertness: Awake/alert Behavior During Therapy: WFL for tasks assessed/performed Overall Cognitive Status: Within Functional Limits for tasks assessed                                          Exercises Total Joint Exercises Ankle Circles/Pumps: AROM, Both, 15 reps, Seated Long Arc Quad: AROM, Both, 15 reps, Seated (cues for correct form) Knee Flexion: AROM, Both, 15 reps,  Seated (cues for correct form; 5 sec hold with each rep)    General Comments        Pertinent Vitals/Pain Pain Assessment Pain Assessment: Faces Faces Pain Scale: Hurts little more Pain Location: R ankle, R knee, L knee Pain Descriptors / Indicators: Discomfort Pain Intervention(s): Limited activity within patient's tolerance, Monitored during session    Home Living                          Prior Function            PT Goals (current goals can now be found in the care plan section) Progress  towards PT goals: Progressing toward goals    Frequency    Min 3X/week      PT Plan Current plan remains appropriate    Co-evaluation PT/OT/SLP Co-Evaluation/Treatment: Yes Reason for Co-Treatment: For patient/therapist safety (based on last tx was +2 posterior lean)          AM-PAC PT "6 Clicks" Mobility   Outcome Measure  Help needed turning from your back to your side while in a flat bed without using bedrails?: A Little Help needed moving from lying on your back to sitting on the side of a flat bed without using bedrails?: A Little Help needed moving to and from a bed to a chair (including a wheelchair)?: A Lot Help needed standing up from a chair using your arms (e.g., wheelchair or bedside chair)?: A Lot Help needed to walk in hospital room?: A Lot Help needed climbing 3-5 steps with a railing? : Total 6 Click Score: 13    End of Session Equipment Utilized During Treatment: Gait belt Activity Tolerance: Patient tolerated treatment well Patient left: with call bell/phone within reach;in chair Nurse Communication: Mobility status PT Visit Diagnosis: Muscle weakness (generalized) (M62.81);Pain Pain - Right/Left: Right Pain - part of body: Ankle and joints of foot     Time: 1249-1319 PT Time Calculation (min) (ACUTE ONLY): 30 min  Charges:  $Gait Training: 8-22 mins                     Abran Richard, PT Acute Rehab [facility] 10/11/2022, 1:33 PM

## 2022-10-11 NOTE — Plan of Care (Signed)
Patient AOX4, VSS throughout shift.  All meds given on time as ordered.  Diminished lungs, IS encouraged.  Denied pain and SOB.  Purewick in place.  POC maintained, will continue to monitor.  Problem: Education: Goal: Ability to describe self-care measures that may prevent or decrease complications (Diabetes Survival Skills Education) will improve Outcome: Progressing Goal: Individualized Educational Video(s) Outcome: Progressing   Problem: Coping: Goal: Ability to adjust to condition or change in health will improve Outcome: Progressing   Problem: Fluid Volume: Goal: Ability to maintain a balanced intake and output will improve Outcome: Progressing   Problem: Health Behavior/Discharge Planning: Goal: Ability to identify and utilize available resources and services will improve Outcome: Progressing Goal: Ability to manage health-related needs will improve Outcome: Progressing   Problem: Metabolic: Goal: Ability to maintain appropriate glucose levels will improve Outcome: Progressing   Problem: Nutritional: Goal: Maintenance of adequate nutrition will improve Outcome: Progressing Goal: Progress toward achieving an optimal weight will improve Outcome: Progressing   Problem: Skin Integrity: Goal: Risk for impaired skin integrity will decrease Outcome: Progressing   Problem: Tissue Perfusion: Goal: Adequacy of tissue perfusion will improve Outcome: Progressing   Problem: Education: Goal: Knowledge of General Education information will improve Description: Including pain rating scale, medication(s)/side effects and non-pharmacologic comfort measures Outcome: Progressing   Problem: Health Behavior/Discharge Planning: Goal: Ability to manage health-related needs will improve Outcome: Progressing   Problem: Clinical Measurements: Goal: Ability to maintain clinical measurements within normal limits will improve Outcome: Progressing Goal: Will remain free from  infection Outcome: Progressing Goal: Diagnostic test results will improve Outcome: Progressing Goal: Respiratory complications will improve Outcome: Progressing Goal: Cardiovascular complication will be avoided Outcome: Progressing   Problem: Activity: Goal: Risk for activity intolerance will decrease Outcome: Progressing   Problem: Nutrition: Goal: Adequate nutrition will be maintained Outcome: Progressing   Problem: Coping: Goal: Level of anxiety will decrease Outcome: Progressing   Problem: Elimination: Goal: Will not experience complications related to bowel motility Outcome: Progressing Goal: Will not experience complications related to urinary retention Outcome: Progressing   Problem: Pain Managment: Goal: General experience of comfort will improve Outcome: Progressing   Problem: Safety: Goal: Ability to remain free from injury will improve Outcome: Progressing   Problem: Skin Integrity: Goal: Risk for impaired skin integrity will decrease Outcome: Progressing

## 2022-10-11 NOTE — Progress Notes (Signed)
Occupational Therapy Treatment Patient Details Name: Mckenzie Rogers MRN: [id number] DOB: [birth year] Today's Date: 10/11/2022   History of present illness Patient is a 82 year old female who Developed severe gout in her right ankle unable to bare weight  on 10/04/22.  Pt s/p joint aspiration R knee and cortisone injection R knee and R ankle 10/06/22  PMHx: OA (b knees), DM, L TKA 12/7   OT comments  Patient was noted to have significant improvement towards standing balance and ability to engage in ADLs. Patient was mod A to transfer with no posterior leaning noted. Patient reported less pain in BLE but continues to have pain present. Patient is stil not at a level to be able to transition home alone. Patient's discharge plan remains appropriate at this time. OT will continue to follow acutely.     Recommendations for follow up therapy are one component of a multi-disciplinary discharge planning process, led by the attending physician.  Recommendations may be updated based on patient status, additional functional criteria and insurance authorization.    Follow Up Recommendations  Skilled nursing-short term rehab (<3 hours/day)     Assistance Recommended at Discharge Frequent or constant Supervision/Assistance  Patient can return home with the following  A lot of help with walking and/or transfers;A lot of help with bathing/dressing/bathroom;Assistance with cooking/housework;Direct supervision/assist for financial management;Assist for transportation;Help with stairs or ramp for entrance;Direct supervision/assist for medications management   Equipment Recommendations  None recommended by OT    Recommendations for Other Services      Precautions / Restrictions Precautions Precautions: Fall Precaution Comments: recent TKA 12/7 LLE Restrictions Weight Bearing Restrictions: No       Mobility Bed Mobility Overal bed mobility: Needs Assistance Bed Mobility: Supine to Sit     Supine  to sit: Supervision     General bed mobility comments: Increased time    Transfers                         Balance Overall balance assessment: Needs assistance Sitting-balance support: Feet supported Sitting balance-Leahy Scale: Good     Standing balance support: Reliant on assistive device for balance, Bilateral upper extremity supported Standing balance-Leahy Scale: Poor Standing balance comment: Requiring RW and min guard                           ADL either performed or assessed with clinical judgement   ADL Overall ADL's : Needs assistance/impaired     Grooming: Oral care;Sitting Grooming Details (indicate cue type and reason): MI once supplied items needed in recliner               Lower Body Dressing Details (indicate cue type and reason): patient was able to don bilateral slippers with MI sitting EOB. patient continues to report fear of falling with slippers declines to wear gripper socks and family who was visiting did not bring sneakers as discussed during previous session. Toilet Transfer: Moderate assistance;Ambulation;Rolling walker (2 wheels) Toilet Transfer Details (indicate cue type and reason): patient was noted to have significant difference in the ability to engage in ADLs with no posterior leaning noted. patient was able to engage in steps with RW into hallway.                Extremity/Trunk Assessment              Vision       Perception  Praxis      Cognition Arousal/Alertness: Awake/alert Behavior During Therapy: WFL for tasks assessed/performed Overall Cognitive Status: Within Functional Limits for tasks assessed                                          Exercises      Shoulder Instructions       General Comments      Pertinent Vitals/ Pain       Pain Assessment Pain Assessment: Faces Faces Pain Scale: Hurts little more Pain Location: R ankle, R knee, L knee Pain Descriptors  / Indicators: Discomfort Pain Intervention(s): Limited activity within patient's tolerance, Monitored during session  Home Living                                          Prior Functioning/Environment              Frequency  Min 2X/week        Progress Toward Goals  OT Goals(current goals can now be found in the care plan section)  Progress towards OT goals: Progressing toward goals     Plan Discharge plan remains appropriate    Co-evaluation    PT/OT/SLP Co-Evaluation/Treatment: Yes Reason for Co-Treatment: For patient/therapist safety (was previously +2 with strong posterior lean in last session) PT goals addressed during session: Mobility/safety with mobility OT goals addressed during session: ADL's and self-care      AM-PAC OT "6 Clicks" Daily Activity     Outcome Measure   Help from another person eating meals?: A Little Help from another person taking care of personal grooming?: A Little Help from another person toileting, which includes using toliet, bedpan, or urinal?: A Lot Help from another person bathing (including washing, rinsing, drying)?: A Lot Help from another person to put on and taking off regular upper body clothing?: A Little Help from another person to put on and taking off regular lower body clothing?: A Lot 6 Click Score: 15    End of Session Equipment Utilized During Treatment: Gait belt;Rolling walker (2 wheels)  OT Visit Diagnosis: Unsteadiness on feet (R26.81);Other abnormalities of gait and mobility (R26.89);Pain   Activity Tolerance Patient limited by pain   Patient Left with call bell/phone within reach;in chair;with family/visitor present   Nurse Communication Mobility status        Time: 1249-1314 OT Time Calculation (min): 25 min  Charges: OT General Charges $OT Visit: 1 Visit OT Treatments $Self Care/Home Management : 8-22 mins  Rennie Plowman, MS Acute Rehabilitation Department Office#  [phone]   Willa Rough 10/11/2022, 2:00 PM

## 2022-10-12 DIAGNOSIS — N3000 Acute cystitis without hematuria: Secondary | ICD-10-CM | POA: Diagnosis not present

## 2022-10-12 DIAGNOSIS — D649 Anemia, unspecified: Secondary | ICD-10-CM | POA: Diagnosis not present

## 2022-10-12 DIAGNOSIS — M25461 Effusion, right knee: Secondary | ICD-10-CM | POA: Diagnosis not present

## 2022-10-12 DIAGNOSIS — M25571 Pain in right ankle and joints of right foot: Secondary | ICD-10-CM | POA: Diagnosis not present

## 2022-10-12 DIAGNOSIS — M6281 Muscle weakness (generalized): Secondary | ICD-10-CM | POA: Diagnosis not present

## 2022-10-12 DIAGNOSIS — R262 Difficulty in walking, not elsewhere classified: Secondary | ICD-10-CM | POA: Diagnosis not present

## 2022-10-12 DIAGNOSIS — R5381 Other malaise: Secondary | ICD-10-CM | POA: Diagnosis not present

## 2022-10-12 DIAGNOSIS — M109 Gout, unspecified: Secondary | ICD-10-CM | POA: Diagnosis not present

## 2022-10-12 DIAGNOSIS — I10 Essential (primary) hypertension: Secondary | ICD-10-CM | POA: Diagnosis not present

## 2022-10-12 DIAGNOSIS — R7989 Other specified abnormal findings of blood chemistry: Secondary | ICD-10-CM | POA: Diagnosis not present

## 2022-10-12 DIAGNOSIS — M10071 Idiopathic gout, right ankle and foot: Secondary | ICD-10-CM | POA: Diagnosis not present

## 2022-10-12 DIAGNOSIS — E785 Hyperlipidemia, unspecified: Secondary | ICD-10-CM | POA: Diagnosis not present

## 2022-10-12 DIAGNOSIS — E1165 Type 2 diabetes mellitus with hyperglycemia: Secondary | ICD-10-CM | POA: Diagnosis not present

## 2022-10-12 DIAGNOSIS — R5383 Other fatigue: Secondary | ICD-10-CM | POA: Diagnosis not present

## 2022-10-12 DIAGNOSIS — E119 Type 2 diabetes mellitus without complications: Secondary | ICD-10-CM | POA: Diagnosis not present

## 2022-10-12 DIAGNOSIS — N390 Urinary tract infection, site not specified: Secondary | ICD-10-CM | POA: Diagnosis not present

## 2022-10-12 DIAGNOSIS — F339 Major depressive disorder, recurrent, unspecified: Secondary | ICD-10-CM | POA: Diagnosis not present

## 2022-10-12 DIAGNOSIS — E8809 Other disorders of plasma-protein metabolism, not elsewhere classified: Secondary | ICD-10-CM | POA: Diagnosis not present

## 2022-10-12 DIAGNOSIS — E669 Obesity, unspecified: Secondary | ICD-10-CM | POA: Diagnosis not present

## 2022-10-12 DIAGNOSIS — G8929 Other chronic pain: Secondary | ICD-10-CM | POA: Diagnosis not present

## 2022-10-12 DIAGNOSIS — R3989 Other symptoms and signs involving the genitourinary system: Secondary | ICD-10-CM | POA: Diagnosis not present

## 2022-10-12 DIAGNOSIS — M10061 Idiopathic gout, right knee: Secondary | ICD-10-CM | POA: Diagnosis not present

## 2022-10-12 DIAGNOSIS — N3281 Overactive bladder: Secondary | ICD-10-CM | POA: Diagnosis not present

## 2022-10-12 DIAGNOSIS — N952 Postmenopausal atrophic vaginitis: Secondary | ICD-10-CM | POA: Diagnosis not present

## 2022-10-12 DIAGNOSIS — Z743 Need for continuous supervision: Secondary | ICD-10-CM | POA: Diagnosis not present

## 2022-10-12 DIAGNOSIS — I959 Hypotension, unspecified: Secondary | ICD-10-CM | POA: Diagnosis not present

## 2022-10-12 LAB — CREATININE, SERUM
Creatinine, Ser: 0.8 mg/dL (ref 0.44–1.00)
GFR, Estimated: >60 mL/min (ref >60)

## 2022-10-12 LAB — GLUCOSE, CAPILLARY
Glucose-Capillary: 88 mg/dL (ref 70–99)
Glucose-Capillary: 114 mg/dL — ABNORMAL HIGH (ref 70–99)

## 2022-10-12 NOTE — Discharge Summary (Signed)
Physician Discharge Summary   Patient: Mckenzie Rogers MRN: [id number] DOB: 21-Apr-1941  Admit date:     10/04/2022  Discharge date: 10/12/22  Discharge Physician: Thad Ranger, MD    PCP: Gaspar Garbe, MD   Recommendations at discharge:   Continue prednisone 30 mg daily for 1 more day, then taper to 20 mg daily for 3 days, 10 mg daily for 3 days then off. Continue Remeron 7.5 mg at bedtime  Discharge Diagnoses:  Acute on chronic gout with flare, right ankle, right knee E. coli UTI ESBL  Controlled type 2 diabetes mellitus without complication, without long-term current use of insulin (HCC)   Debility Hyperlipidemia Overactive bladder Obesity   Hospital Course:  Mckenzie Rogers is a 82 y.o. female with a history of gout, history of left knee replacement, remote DVT, diabetes mellitus type 2, CKD 2. Patient presented secondary to right ankle swelling and inability to bear weight. Patient started on prednisone and colchicine. PT/OT ordered. While admitted, patient found to have a UTI with history of ESBL.     Assessment and Plan: Acute on chronic gout with flare, right ankle, right knee -Manages at home on colchicine as needed, no maintenance medication. - started on prednisone, colchicine this admission -ESR 59, CRP 4.1, uric acid 4.0 -Patient had a left total knee arthroplasty on 08/19/2022, during postop.,  She was noted to have right knee pain, had arthrocentesis done, negative for septic arthritis but showed gout crystals.. -s/p arthrocentesis and intra-articular injections in the right knee and ankle by orthopedics on 1/26 -No significant pain, colchicine has been discontinued now. -Continue prednisone with taper as per instructions above -PT OT evaluation recommended SNF    E. coli UTI -Complained of dysuria and UA + UTI.   -Previous history of ESBL UTI 08/09/2022 -Urine culture positive for ESBL E. coli, completed antibiotics  x 5days on 10/10/2022      Diabetes mellitus type 2, IDDM, uncontrolled with hyperglycemia -Hemoglobin A1c 6.3 -Continue sliding scale insulin, Basaglar 16 units daily    Overactive bladder Patient is on Toviaz and Myrbetriq as an outpatient.   Hyperlipidemia -Continue Lipitor   Debility Patient is s/p left total knee arthroplasty in 08/2022. She was previously on Xarelto for DVT prophylaxis which is now discontinued. -Continue Lexapro qAM and Remeron 7.5 mg at bedtime   CAPACITY EVALUATION done on 1/26 - patient does have capacity to make her medical decisions.   - She is agreeable to go to skilled nursing facility   Obesity Estimated body mass index is 31.87 kg/m as calculated from the following:   Height as of 09/13/22: 5\' 3"  (1.6 m).   Weight as of this encounter: 81.6 kg.      Pain control - Controlled Substance Reporting System database was reviewed. and patient was instructed, not to drive, operate heavy machinery, perform activities at heights, swimming or participation in water activities or provide baby-sitting services while on Pain, Sleep and Anxiety Medications; until their outpatient Physician has advised to do so again. Also recommended to not to take more than prescribed Pain, Sleep and Anxiety Medications.  Consultants: Orthopedics Procedures performed: arthrocentesis and intra-articular injections in the right knee and ankle by orthopedics on 1/26 Disposition: Skilled nursing facility Diet recommendation:  Discharge Diet Orders (From admission, onward)     Start     Ordered   10/12/22 0000  Diet Carb Modified        10/12/22 1127  Carb modified diet DISCHARGE MEDICATION: Allergies as of 10/12/2022       Reactions   Aspirin Other (See Comments)   Pt unsure of reaction   Clorazepate Dipotassium Other (See Comments)   "Can't function"   Ketoprofen Other (See Comments)   Pt unsure of reaction   Oxycodone-aspirin Swelling, Other (See Comments)   Tongue  swells        Medication List     STOP taking these medications    Accu-Chek Aviva Plus test strip Generic drug: glucose blood   buPROPion 150 MG 24 hr tablet Commonly known as: WELLBUTRIN XL   Farxiga 10 MG Tabs tablet Generic drug: dapagliflozin propanediol   OneTouch Delica Plus Lancet30G Misc   True Metrix Meter w/Device Kit   valsartan 80 MG tablet Commonly known as: DIOVAN       TAKE these medications    acetaminophen 650 MG CR tablet Commonly known as: TYLENOL Take 650 mg by mouth every 8 (eight) hours as needed for pain.   albuterol 108 (90 Base) MCG/ACT inhaler Commonly known as: VENTOLIN HFA Inhale 1-2 puffs into the lungs every 6 (six) hours as needed for wheezing or shortness of breath.   atorvastatin 80 MG tablet Commonly known as: LIPITOR Take 80 mg by mouth at bedtime.   Basaglar KwikPen 100 UNIT/ML Inject 16 Units into the skin daily. What changed:  how much to take when to take this reasons to take this   bisacodyl 5 MG EC tablet Commonly known as: DULCOLAX Take 2 tablets (10 mg total) by mouth daily as needed for moderate constipation.   colchicine 0.6 MG tablet Take 1 tablet (0.6 mg total) by mouth 2 (two) times daily for 5 days. During acute gout attack What changed: additional instructions   docusate sodium 100 MG capsule Commonly known as: COLACE Take 1 capsule (100 mg total) by mouth 2 (two) times daily. What changed:  when to take this reasons to take this   escitalopram 20 MG tablet Commonly known as: LEXAPRO Take 20 mg by mouth daily.   HYDROmorphone 2 MG tablet Commonly known as: Dilaudid Take 0.5 tablets (1 mg total) by mouth every 6 (six) hours as needed for severe pain. What changed: when to take this   insulin aspart 100 UNIT/ML injection Commonly known as: novoLOG Inject 0-9 Units into the skin 3 (three) times daily with meals. Sliding scale CBG 70 - 120: 0 units CBG 121 - 150: 1 unit,  CBG 151 - 200: 2 units,   CBG 201 - 250: 3 units,  CBG 251 - 300: 5 units,  CBG 301 - 350: 7 units,  CBG 351 - 400: 9 units   CBG > 400: 9 units and notify your MD   methocarbamol 500 MG tablet Commonly known as: ROBAXIN Take 1 tablet (500 mg total) by mouth every 8 (eight) hours as needed for muscle spasms.   mirtazapine 7.5 MG tablet Commonly known as: REMERON Take 1 tablet (7.5 mg total) by mouth at bedtime.   Myrbetriq 25 MG Tb24 tablet Generic drug: mirabegron ER Take 25 mg by mouth daily.   polyethylene glycol 17 g packet Commonly known as: MIRALAX / GLYCOLAX Take 17 g by mouth daily as needed.   predniSONE 10 MG tablet Commonly known as: DELTASONE Take 30mg  PO daily for 1 day, then 20mg  daily x 3 days, then 10mg  daily for 3 days, then OFF   Toviaz 4 MG Tb24 tablet Generic drug: fesoterodine Take 4 mg  by mouth daily.        Follow-up [facility]. Schedule an appointment as soon as possible for a visit.   Specialty: Orthopedics Why: for hospital follow-up Contact information: [place] [address] [phone]        [name], Fransico Him, MD. Schedule an appointment as soon as possible for a visit in 2 week(s).   Specialty: Internal Medicine Why: for hospital follow-up Contact information: [place] [address] [phone]                Discharge Exam: Danley Danker Weights   10/04/22 2200  Weight: 81.6 kg   S: Doing well, right knee and right ankle feels better now.  Looking forward to go to SNF.  No acute issues.  BP 128/68 (BP Location: Left Arm)   Pulse (!) 48   Temp 97.7 F (36.5 C)   Resp 19   Wt 81.6 kg   LMP  (LMP Unknown)   SpO2 97%   BMI 31.87 kg/m    Physical Exam General: Alert and oriented x 3, NAD Cardiovascular: S1 S2 clear, RRR.  Respiratory: CTAB, no wheezing Gastrointestinal: Soft, nontender, nondistended, NBS Ext: no pedal edema bilaterally Neuro: no new  deficits Skin: No rashes Psych: Normal affect and demeanor, alert and oriented x3    Condition at discharge: fair  The results of significant diagnostics from this hospitalization (including imaging, microbiology, ancillary and laboratory) are listed below for reference.   Imaging Studies: DG Ankle Complete Right  Result Date: 10/04/2022 CLINICAL DATA:  Right ankle pain without known injury. History of gout. EXAM: RIGHT ANKLE - COMPLETE 3+ VIEW COMPARISON:  None Available. FINDINGS: There is no evidence of fracture, dislocation, or joint effusion. Mild lateral soft tissue swelling is noted. Mild anterior osteophyte formation is seen involving the talotibial joint. IMPRESSION: Mild degenerative changes as described above. No acute abnormality seen. Electronically Signed   By: Marijo Conception M.D.   On: 10/04/2022 13:50   VAS [place] LOWER EXTREMITY VENOUS (DVT) (7a-7p)  Result Date: 09/14/2022  Lower Venous DVT Study Patient Name:  Mckenzie Rogers  Date of Exam:   09/13/2022 Medical Rec #: [id number]           Accession #:    [id number] Date of Birth: [birth year]           Patient Gender: F Patient Age:   10 years Exam Location:  [facility] Procedure:      VAS [place] LOWER EXTREMITY VENOUS (DVT) Referring Phys: CELESTE BEATTY --------------------------------------------------------------------------------  Indications: Pain. Other Indications: Per patient - recently has fluid removed from behind right                    knee. Recent diagnosis of gout. Limitations: Scannin conditions - lights of for RN to start IV. Performing Technologist: Rogelia Rohrer RVT, RDMS  Examination Guidelines: A complete evaluation includes B-mode imaging, spectral Doppler, color Doppler, and power Doppler as needed of all accessible portions of each vessel. Bilateral testing is considered an integral part of a complete examination. Limited examinations for reoccurring indications may be performed as noted. The reflux portion of  the exam is performed with the patient in reverse Trendelenburg.  +---------+---------------+---------+-----------+----------+--------------+ RIGHT    CompressibilityPhasicitySpontaneityPropertiesThrombus Aging +---------+---------------+---------+-----------+----------+--------------+ CFV      Full           Yes      Yes                                 +---------+---------------+---------+-----------+----------+--------------+  SFJ      Full                                                        +---------+---------------+---------+-----------+----------+--------------+ FV Prox  Full           Yes      Yes                                 +---------+---------------+---------+-----------+----------+--------------+ FV Mid   Full           Yes      Yes                                 +---------+---------------+---------+-----------+----------+--------------+ FV DistalFull           Yes      Yes                                 +---------+---------------+---------+-----------+----------+--------------+ PFV      Full                                                        +---------+---------------+---------+-----------+----------+--------------+ POP      Full           Yes      Yes                                 +---------+---------------+---------+-----------+----------+--------------+ PTV      Full                                                        +---------+---------------+---------+-----------+----------+--------------+ PERO     Full                                                        +---------+---------------+---------+-----------+----------+--------------+   +----+---------------+---------+-----------+----------+--------------+ LEFTCompressibilityPhasicitySpontaneityPropertiesThrombus Aging +----+---------------+---------+-----------+----------+--------------+ CFV Full           Yes      Yes                                  +----+---------------+---------+-----------+----------+--------------+     Summary: RIGHT: - There is no evidence of deep vein thrombosis in the lower extremity.  - No cystic structure found in the popliteal fossa.  LEFT: - No evidence of common femoral vein obstruction.  *See table(s) above for measurements and observations. Electronically signed by Waverly Ferrari MD on 09/14/2022 at 3:49:19 PM.    Final    DG Knee Complete 4 Views Right  Result Date: 09/13/2022 CLINICAL DATA:  History of gout.  Right knee pain. EXAM: RIGHT KNEE - COMPLETE 4+ VIEW COMPARISON:  Right knee radiographs 08/09/2022 and 11/06/2021 FINDINGS: There is diffuse decreased bone mineralization. Severe medial compartment and moderate lateral compartment joint space narrowing and peripheral degenerative osteophytes. Medial compartment bone-on-bone contact with subchondral sclerosis and cystic change. Severe patellofemoral joint space narrowing. Moderate superior and mild inferior patellar degenerative osteophytes. Minimal chronic enthesopathic change at the quadriceps insertion on the patella. Small joint effusion, new from prior. No acute fracture or dislocation. IMPRESSION: 1. Severe medial compartment and patellofemoral compartment osteoarthritis. 2. Small joint effusion, new from prior. Electronically Signed   By: Yvonne Kendall M.D.   On: 09/13/2022 18:02    Microbiology: Results for orders placed or performed during the hospital encounter of 10/04/22  Urine Culture     Status: Abnormal   Collection Time: 10/04/22  9:16 PM   Specimen: Urine, Clean Catch  Result Value Ref Range Status   Specimen Description   Final    URINE, CLEAN CATCH Performed at [facility], [place] [address] [place], [place] [address]    Special Requests   Final    NONE Performed at [facility], [place] [address] [place], [place] [address]    Culture (A)  Final    >=100,000 COLONIES/mL ESCHERICHIA COLI Confirmed  Extended Spectrum Beta-Lactamase Producer (ESBL).  In bloodstream infections from ESBL organisms, carbapenems are preferred over piperacillin/tazobactam. They are shown to have a lower risk of mortality.    Report Status 10/07/2022 FINAL  Final   Organism ID, Bacteria ESCHERICHIA COLI (A)  Final      Susceptibility   Escherichia coli - MIC*    AMPICILLIN >=32 RESISTANT Resistant     CEFAZOLIN >=64 RESISTANT Resistant     CEFEPIME 16 RESISTANT Resistant     CEFTRIAXONE >=64 RESISTANT Resistant     CIPROFLOXACIN >=4 RESISTANT Resistant     GENTAMICIN <=1 SENSITIVE Sensitive     IMIPENEM <=0.25 SENSITIVE Sensitive     NITROFURANTOIN <=16 SENSITIVE Sensitive     TRIMETH/SULFA >=320 RESISTANT Resistant     AMPICILLIN/SULBACTAM >=32 RESISTANT Resistant     PIP/TAZO <=4 SENSITIVE Sensitive     * >=100,000 COLONIES/mL ESCHERICHIA COLI  Body fluid culture w Gram Stain     Status: None   Collection Time: 10/08/22  7:21 AM   Specimen: KNEE; Synovial Fluid  Result Value Ref Range Status   Specimen Description   Final    KNEE Performed at [place] [address], [place], [place] [address]    Special Requests   Final    Normal Performed at [facility], [place] [address] [place], [place] [address]    Gram Stain   Final    FEW WBC PRESENT, PREDOMINANTLY MONONUCLEAR NO ORGANISMS SEEN    Culture   Final    NO GROWTH 3 DAYS Performed at [place] [address], [place], [place] [address]    Report Status 10/11/2022 FINAL  Final  Anaerobic culture w Gram Stain     Status: None (Preliminary result)   Collection Time: 10/08/22  7:21 AM   Specimen: KNEE; Synovial Fluid  Result Value Ref Range Status   Specimen Description   Final    KNEE Performed at [place] [address] [place],  [address]    Special Requests   Final    Normal Performed at [facility]  [place] [address],  [place], [place] [address]    Gram Stain   Final    FEW WBC PRESENT, PREDOMINANTLY MONONUCLEAR NO ORGANISMS SEEN Performed at [facility], [place] [address] [place], [place] [address]    Culture   Final    NO ANAEROBES ISOLATED; CULTURE IN PROGRESS FOR 5 DAYS   Report Status PENDING  Incomplete    Labs: CBC: Recent Labs  Lab 10/05/22 1320  WBC 3.2*  HGB 12.8  HCT 40.9  MCV 95.3  PLT 017   Basic Metabolic Panel: Recent Labs  Lab 10/05/22 1320 10/06/22 1344 10/07/22 0507 10/12/22 0526  NA 138 138 136  --   K 4.2 3.8 3.7  --   CL 104 105 103  --   CO2 23 21* 23  --   GLUCOSE 143* 120* 89  --   BUN 23 22 20   --   CREATININE 1.14* 1.09* 0.99 0.80  CALCIUM 8.9 8.8* 8.7*  --   MG 2.0  --   --   --   PHOS 3.7  --   --   --    Liver Function Tests: Recent Labs  Lab 10/05/22 1320  AST 37  ALT 30  ALKPHOS 61  BILITOT 0.7  PROT 8.3*  ALBUMIN 3.2*   CBG: Recent Labs  Lab 10/11/22 0717 10/11/22 1117 10/11/22 1618 10/11/22 1949 10/12/22 0820  GLUCAP 106* 132* 195* 176* 88    Discharge time spent: greater than 30 minutes.  Signed: Estill Cotta, MD [facility] 10/12/2022

## 2022-10-12 NOTE — TOC Transition Note (Signed)
Transition of Care [facility]) - CM/SW Discharge Note   Patient Details  Name: Mckenzie Rogers MRN: [id number] Date of Birth: 1941/08/10  Transition of Care [facility]) CM/SW Contact:  Vassie Moselle, LCSW Phone Number: 10/12/2022, 12:14 PM   Clinical Narrative:    Pt's insurance has been approved for SNF placement. Pt is able to transfer to [facility]. Pt will be going to room 130a. RN to call report to [phone]. Pt and son made aware of transfer and both are agreeable. [facility] called at 12:13pm to transport pt to [facility].    Final next level of care: Skilled Nursing Facility Barriers to Discharge: Barriers Resolved   Patient Goals and CMS Choice CMS Medicare.gov Compare Post Acute Care list provided to:: Patient Choice offered to / list presented to : Patient  Discharge Placement     Existing PASRR number confirmed : 10/08/22          Patient [facility] bed at: [facility] Patient to be transferred to facility by: [place] Name of family member notified: Lurlean Leyden Patient and family notified of of transfer: 10/12/22  Discharge Plan and Services Additional resources added to the After Visit Summary for   In-house Referral: Clinical Social Work Discharge Planning Services: CM Consult Post Acute Care Choice: Home Health, Durable Medical Equipment          DME Arranged: N/A DME Agency: NA       HH Arranged: PT, OT, RN, Nurse's Aide [place] Agency: [place] Date [place]: 10/06/22 Time HH Agency Contacted: 1200 Representative spoke with at [place]: [place] Determinants of Health ([place]) Interventions [place]: No Food Insecurity (08/22/2022)  Housing: Low Risk  (08/22/2022)  Transportation Needs: No Transportation Needs (08/22/2022)  Utilities: Not At Risk (08/22/2022)  Tobacco Use: Low Risk  (10/05/2022)     Readmission Risk Interventions    10/11/2022    1:00 PM 10/07/2022    1:48 PM  Readmission  Risk Prevention Plan  Post Dischage Appt Complete   Medication Screening Complete   Transportation Screening Complete Complete  PCP or Specialist Appt within 5-7 Days  Complete  Home Care Screening  Complete  Medication Review (RN CM)  Complete

## 2022-10-13 DIAGNOSIS — E1165 Type 2 diabetes mellitus with hyperglycemia: Secondary | ICD-10-CM | POA: Diagnosis not present

## 2022-10-13 DIAGNOSIS — F339 Major depressive disorder, recurrent, unspecified: Secondary | ICD-10-CM | POA: Diagnosis not present

## 2022-10-13 DIAGNOSIS — M10071 Idiopathic gout, right ankle and foot: Secondary | ICD-10-CM | POA: Diagnosis not present

## 2022-10-13 DIAGNOSIS — M6281 Muscle weakness (generalized): Secondary | ICD-10-CM | POA: Diagnosis not present

## 2022-10-13 LAB — ANAEROBIC CULTURE W GRAM STAIN: Special Requests: NORMAL

## 2022-10-14 ENCOUNTER — Encounter: Payer: Medicare HMO | Admitting: Orthopedic Surgery

## 2022-10-15 DIAGNOSIS — E669 Obesity, unspecified: Secondary | ICD-10-CM | POA: Diagnosis not present

## 2022-10-15 DIAGNOSIS — F339 Major depressive disorder, recurrent, unspecified: Secondary | ICD-10-CM | POA: Diagnosis not present

## 2022-10-15 DIAGNOSIS — E785 Hyperlipidemia, unspecified: Secondary | ICD-10-CM | POA: Diagnosis not present

## 2022-10-15 DIAGNOSIS — M10071 Idiopathic gout, right ankle and foot: Secondary | ICD-10-CM | POA: Diagnosis not present

## 2022-10-15 DIAGNOSIS — E1165 Type 2 diabetes mellitus with hyperglycemia: Secondary | ICD-10-CM | POA: Diagnosis not present

## 2022-10-15 DIAGNOSIS — M6281 Muscle weakness (generalized): Secondary | ICD-10-CM | POA: Diagnosis not present

## 2022-10-18 DIAGNOSIS — R5381 Other malaise: Secondary | ICD-10-CM | POA: Diagnosis not present

## 2022-10-18 DIAGNOSIS — R5383 Other fatigue: Secondary | ICD-10-CM | POA: Diagnosis not present

## 2022-10-19 DIAGNOSIS — E1165 Type 2 diabetes mellitus with hyperglycemia: Secondary | ICD-10-CM | POA: Diagnosis not present

## 2022-10-19 DIAGNOSIS — M6281 Muscle weakness (generalized): Secondary | ICD-10-CM | POA: Diagnosis not present

## 2022-10-19 DIAGNOSIS — R3989 Other symptoms and signs involving the genitourinary system: Secondary | ICD-10-CM | POA: Diagnosis not present

## 2022-10-20 DIAGNOSIS — M25571 Pain in right ankle and joints of right foot: Secondary | ICD-10-CM | POA: Diagnosis not present

## 2022-10-20 DIAGNOSIS — R5381 Other malaise: Secondary | ICD-10-CM | POA: Diagnosis not present

## 2022-10-20 DIAGNOSIS — R262 Difficulty in walking, not elsewhere classified: Secondary | ICD-10-CM | POA: Diagnosis not present

## 2022-10-21 DIAGNOSIS — N390 Urinary tract infection, site not specified: Secondary | ICD-10-CM | POA: Diagnosis not present

## 2022-10-22 DIAGNOSIS — R3989 Other symptoms and signs involving the genitourinary system: Secondary | ICD-10-CM | POA: Diagnosis not present

## 2022-10-22 DIAGNOSIS — N952 Postmenopausal atrophic vaginitis: Secondary | ICD-10-CM | POA: Diagnosis not present

## 2022-10-25 DIAGNOSIS — N390 Urinary tract infection, site not specified: Secondary | ICD-10-CM | POA: Diagnosis not present

## 2022-10-25 DIAGNOSIS — N952 Postmenopausal atrophic vaginitis: Secondary | ICD-10-CM | POA: Diagnosis not present

## 2022-10-27 DIAGNOSIS — M25571 Pain in right ankle and joints of right foot: Secondary | ICD-10-CM | POA: Diagnosis not present

## 2022-10-27 DIAGNOSIS — R5381 Other malaise: Secondary | ICD-10-CM | POA: Diagnosis not present

## 2022-10-27 DIAGNOSIS — D649 Anemia, unspecified: Secondary | ICD-10-CM | POA: Diagnosis not present

## 2022-10-27 DIAGNOSIS — R262 Difficulty in walking, not elsewhere classified: Secondary | ICD-10-CM | POA: Diagnosis not present

## 2022-10-27 DIAGNOSIS — R7989 Other specified abnormal findings of blood chemistry: Secondary | ICD-10-CM | POA: Diagnosis not present

## 2022-10-27 DIAGNOSIS — E8809 Other disorders of plasma-protein metabolism, not elsewhere classified: Secondary | ICD-10-CM | POA: Diagnosis not present

## 2022-10-28 DIAGNOSIS — M6281 Muscle weakness (generalized): Secondary | ICD-10-CM | POA: Diagnosis not present

## 2022-10-28 DIAGNOSIS — F339 Major depressive disorder, recurrent, unspecified: Secondary | ICD-10-CM | POA: Diagnosis not present

## 2022-10-28 DIAGNOSIS — M10071 Idiopathic gout, right ankle and foot: Secondary | ICD-10-CM | POA: Diagnosis not present

## 2022-10-28 DIAGNOSIS — E1165 Type 2 diabetes mellitus with hyperglycemia: Secondary | ICD-10-CM | POA: Diagnosis not present

## 2022-11-01 DIAGNOSIS — M10061 Idiopathic gout, right knee: Secondary | ICD-10-CM | POA: Diagnosis not present

## 2022-11-01 DIAGNOSIS — R350 Frequency of micturition: Secondary | ICD-10-CM | POA: Diagnosis not present

## 2022-11-01 DIAGNOSIS — F32A Depression, unspecified: Secondary | ICD-10-CM | POA: Diagnosis not present

## 2022-11-01 DIAGNOSIS — E119 Type 2 diabetes mellitus without complications: Secondary | ICD-10-CM | POA: Diagnosis not present

## 2022-11-01 DIAGNOSIS — I10 Essential (primary) hypertension: Secondary | ICD-10-CM | POA: Diagnosis not present

## 2022-11-01 DIAGNOSIS — E785 Hyperlipidemia, unspecified: Secondary | ICD-10-CM | POA: Diagnosis not present

## 2022-11-01 DIAGNOSIS — M10071 Idiopathic gout, right ankle and foot: Secondary | ICD-10-CM | POA: Diagnosis not present

## 2022-11-01 DIAGNOSIS — Z471 Aftercare following joint replacement surgery: Secondary | ICD-10-CM | POA: Diagnosis not present

## 2022-11-01 DIAGNOSIS — M1711 Unilateral primary osteoarthritis, right knee: Secondary | ICD-10-CM | POA: Diagnosis not present

## 2022-11-02 DIAGNOSIS — E1122 Type 2 diabetes mellitus with diabetic chronic kidney disease: Secondary | ICD-10-CM | POA: Diagnosis not present

## 2022-11-02 DIAGNOSIS — M109 Gout, unspecified: Secondary | ICD-10-CM | POA: Diagnosis not present

## 2022-11-02 DIAGNOSIS — Z794 Long term (current) use of insulin: Secondary | ICD-10-CM | POA: Diagnosis not present

## 2022-11-02 DIAGNOSIS — F321 Major depressive disorder, single episode, moderate: Secondary | ICD-10-CM | POA: Diagnosis not present

## 2022-11-02 DIAGNOSIS — R809 Proteinuria, unspecified: Secondary | ICD-10-CM | POA: Diagnosis not present

## 2022-11-02 DIAGNOSIS — M8580 Other specified disorders of bone density and structure, unspecified site: Secondary | ICD-10-CM | POA: Diagnosis not present

## 2022-11-02 DIAGNOSIS — I129 Hypertensive chronic kidney disease with stage 1 through stage 4 chronic kidney disease, or unspecified chronic kidney disease: Secondary | ICD-10-CM | POA: Diagnosis not present

## 2022-11-02 DIAGNOSIS — Z96652 Presence of left artificial knee joint: Secondary | ICD-10-CM | POA: Diagnosis not present

## 2022-11-02 DIAGNOSIS — N182 Chronic kidney disease, stage 2 (mild): Secondary | ICD-10-CM | POA: Diagnosis not present

## 2022-11-04 DIAGNOSIS — I10 Essential (primary) hypertension: Secondary | ICD-10-CM | POA: Diagnosis not present

## 2022-11-04 DIAGNOSIS — M1711 Unilateral primary osteoarthritis, right knee: Secondary | ICD-10-CM | POA: Diagnosis not present

## 2022-11-04 DIAGNOSIS — E119 Type 2 diabetes mellitus without complications: Secondary | ICD-10-CM | POA: Diagnosis not present

## 2022-11-04 DIAGNOSIS — M10071 Idiopathic gout, right ankle and foot: Secondary | ICD-10-CM | POA: Diagnosis not present

## 2022-11-04 DIAGNOSIS — M10061 Idiopathic gout, right knee: Secondary | ICD-10-CM | POA: Diagnosis not present

## 2022-11-04 DIAGNOSIS — E785 Hyperlipidemia, unspecified: Secondary | ICD-10-CM | POA: Diagnosis not present

## 2022-11-04 DIAGNOSIS — Z471 Aftercare following joint replacement surgery: Secondary | ICD-10-CM | POA: Diagnosis not present

## 2022-11-04 DIAGNOSIS — F32A Depression, unspecified: Secondary | ICD-10-CM | POA: Diagnosis not present

## 2022-11-04 DIAGNOSIS — R350 Frequency of micturition: Secondary | ICD-10-CM | POA: Diagnosis not present

## 2022-11-05 DIAGNOSIS — E785 Hyperlipidemia, unspecified: Secondary | ICD-10-CM | POA: Diagnosis not present

## 2022-11-05 DIAGNOSIS — R350 Frequency of micturition: Secondary | ICD-10-CM | POA: Diagnosis not present

## 2022-11-05 DIAGNOSIS — F32A Depression, unspecified: Secondary | ICD-10-CM | POA: Diagnosis not present

## 2022-11-05 DIAGNOSIS — E119 Type 2 diabetes mellitus without complications: Secondary | ICD-10-CM | POA: Diagnosis not present

## 2022-11-05 DIAGNOSIS — I10 Essential (primary) hypertension: Secondary | ICD-10-CM | POA: Diagnosis not present

## 2022-11-05 DIAGNOSIS — Z471 Aftercare following joint replacement surgery: Secondary | ICD-10-CM | POA: Diagnosis not present

## 2022-11-05 DIAGNOSIS — M10061 Idiopathic gout, right knee: Secondary | ICD-10-CM | POA: Diagnosis not present

## 2022-11-05 DIAGNOSIS — M1711 Unilateral primary osteoarthritis, right knee: Secondary | ICD-10-CM | POA: Diagnosis not present

## 2022-11-05 DIAGNOSIS — M10071 Idiopathic gout, right ankle and foot: Secondary | ICD-10-CM | POA: Diagnosis not present

## 2022-11-06 DIAGNOSIS — E119 Type 2 diabetes mellitus without complications: Secondary | ICD-10-CM | POA: Diagnosis not present

## 2022-11-06 DIAGNOSIS — R350 Frequency of micturition: Secondary | ICD-10-CM | POA: Diagnosis not present

## 2022-11-06 DIAGNOSIS — F32A Depression, unspecified: Secondary | ICD-10-CM | POA: Diagnosis not present

## 2022-11-06 DIAGNOSIS — I10 Essential (primary) hypertension: Secondary | ICD-10-CM | POA: Diagnosis not present

## 2022-11-06 DIAGNOSIS — M1711 Unilateral primary osteoarthritis, right knee: Secondary | ICD-10-CM | POA: Diagnosis not present

## 2022-11-06 DIAGNOSIS — E785 Hyperlipidemia, unspecified: Secondary | ICD-10-CM | POA: Diagnosis not present

## 2022-11-06 DIAGNOSIS — Z471 Aftercare following joint replacement surgery: Secondary | ICD-10-CM | POA: Diagnosis not present

## 2022-11-06 DIAGNOSIS — M10061 Idiopathic gout, right knee: Secondary | ICD-10-CM | POA: Diagnosis not present

## 2022-11-06 DIAGNOSIS — M10071 Idiopathic gout, right ankle and foot: Secondary | ICD-10-CM | POA: Diagnosis not present

## 2022-11-08 ENCOUNTER — Ambulatory Visit (INDEPENDENT_AMBULATORY_CARE_PROVIDER_SITE_OTHER): Payer: Medicare HMO | Admitting: Surgical

## 2022-11-08 DIAGNOSIS — Z8739 Personal history of other diseases of the musculoskeletal system and connective tissue: Secondary | ICD-10-CM | POA: Diagnosis not present

## 2022-11-08 DIAGNOSIS — M19071 Primary osteoarthritis, right ankle and foot: Secondary | ICD-10-CM | POA: Diagnosis not present

## 2022-11-09 ENCOUNTER — Telehealth: Payer: Self-pay | Admitting: *Deleted

## 2022-11-09 DIAGNOSIS — I10 Essential (primary) hypertension: Secondary | ICD-10-CM | POA: Diagnosis not present

## 2022-11-09 DIAGNOSIS — M10061 Idiopathic gout, right knee: Secondary | ICD-10-CM | POA: Diagnosis not present

## 2022-11-09 DIAGNOSIS — R350 Frequency of micturition: Secondary | ICD-10-CM | POA: Diagnosis not present

## 2022-11-09 DIAGNOSIS — Z471 Aftercare following joint replacement surgery: Secondary | ICD-10-CM | POA: Diagnosis not present

## 2022-11-09 DIAGNOSIS — E119 Type 2 diabetes mellitus without complications: Secondary | ICD-10-CM | POA: Diagnosis not present

## 2022-11-09 DIAGNOSIS — F32A Depression, unspecified: Secondary | ICD-10-CM | POA: Diagnosis not present

## 2022-11-09 DIAGNOSIS — M1711 Unilateral primary osteoarthritis, right knee: Secondary | ICD-10-CM | POA: Diagnosis not present

## 2022-11-09 DIAGNOSIS — E785 Hyperlipidemia, unspecified: Secondary | ICD-10-CM | POA: Diagnosis not present

## 2022-11-09 DIAGNOSIS — M10071 Idiopathic gout, right ankle and foot: Secondary | ICD-10-CM | POA: Diagnosis not present

## 2022-11-09 LAB — URIC ACID: Uric Acid, Serum: 5.7 mg/dL (ref 2.5–7.0)

## 2022-11-09 NOTE — Telephone Encounter (Signed)
Ortho bundle in office meeting completed on 11/08/22.

## 2022-11-11 DIAGNOSIS — E119 Type 2 diabetes mellitus without complications: Secondary | ICD-10-CM | POA: Diagnosis not present

## 2022-11-11 DIAGNOSIS — R350 Frequency of micturition: Secondary | ICD-10-CM | POA: Diagnosis not present

## 2022-11-11 DIAGNOSIS — M10061 Idiopathic gout, right knee: Secondary | ICD-10-CM | POA: Diagnosis not present

## 2022-11-11 DIAGNOSIS — E785 Hyperlipidemia, unspecified: Secondary | ICD-10-CM | POA: Diagnosis not present

## 2022-11-11 DIAGNOSIS — M10071 Idiopathic gout, right ankle and foot: Secondary | ICD-10-CM | POA: Diagnosis not present

## 2022-11-11 DIAGNOSIS — M1711 Unilateral primary osteoarthritis, right knee: Secondary | ICD-10-CM | POA: Diagnosis not present

## 2022-11-11 DIAGNOSIS — I10 Essential (primary) hypertension: Secondary | ICD-10-CM | POA: Diagnosis not present

## 2022-11-11 DIAGNOSIS — Z471 Aftercare following joint replacement surgery: Secondary | ICD-10-CM | POA: Diagnosis not present

## 2022-11-11 DIAGNOSIS — F32A Depression, unspecified: Secondary | ICD-10-CM | POA: Diagnosis not present

## 2022-11-12 DIAGNOSIS — F32A Depression, unspecified: Secondary | ICD-10-CM | POA: Diagnosis not present

## 2022-11-12 DIAGNOSIS — E785 Hyperlipidemia, unspecified: Secondary | ICD-10-CM | POA: Diagnosis not present

## 2022-11-12 DIAGNOSIS — M10071 Idiopathic gout, right ankle and foot: Secondary | ICD-10-CM | POA: Diagnosis not present

## 2022-11-12 DIAGNOSIS — Z471 Aftercare following joint replacement surgery: Secondary | ICD-10-CM | POA: Diagnosis not present

## 2022-11-12 DIAGNOSIS — M10061 Idiopathic gout, right knee: Secondary | ICD-10-CM | POA: Diagnosis not present

## 2022-11-12 DIAGNOSIS — R350 Frequency of micturition: Secondary | ICD-10-CM | POA: Diagnosis not present

## 2022-11-12 DIAGNOSIS — E119 Type 2 diabetes mellitus without complications: Secondary | ICD-10-CM | POA: Diagnosis not present

## 2022-11-12 DIAGNOSIS — I10 Essential (primary) hypertension: Secondary | ICD-10-CM | POA: Diagnosis not present

## 2022-11-12 DIAGNOSIS — M1711 Unilateral primary osteoarthritis, right knee: Secondary | ICD-10-CM | POA: Diagnosis not present

## 2022-11-14 ENCOUNTER — Encounter: Payer: Self-pay | Admitting: Surgical

## 2022-11-14 NOTE — Progress Notes (Signed)
Post-Op Visit Note   Patient: Mckenzie Rogers           Date of Birth: 1940-11-29           MRN: [id number] Visit Date: 11/08/2022 PCP: Haywood Pao, MD   Assessment & Plan:  Chief Complaint:  Chief Complaint  Patient presents with  . Left Knee - Routine Post Op   Visit Diagnoses:  1. History of gout     Plan: Patient is a 82 year old female who presents for evaluation of left total knee arthroplasty on 08/19/2022.  She is doing well overall in regards to her left knee and she states that she really does not have any significant pain or discomfort in the left knee any longer.  She is working with physical therapists and doing home exercise program to optimize knee range of motion.  On exam, she has no effusion in the left knee.  Incision is well-healed without evidence of infection or dehiscence.  She has 0 degrees extension and about 85 degrees knee flexion.  Excellent quad strength rated 5/5.  No calf tenderness.  Negative Homans' sign.  Her postoperative recovery has been complicated by the passing of her son several days after surgery unexpectedly as well as multiple instances of what seems like gout in the right knee and right ankle.  She has only had 1 aspiration that has shown monosodium urate crystals and this was from the right knee which was successfully treated with Toradol injection in December and with cortisone injection into the right knee and right ankle in January.  Today, her main complaint is right ankle pain.  She states that this is limiting her mobility.  She has no cellulitis or skin changes noted around the right ankle but she does have some swelling and tenderness over the ankle joint line.  Examination is fairly consistent with the last examination on her right ankle from her January hospitalization at [facility] long.  At that time, she had cortisone injection that provided great relief of her symptoms but her symptoms have returned in the last week.  She also  has radiographs demonstrating prominent degenerative changes in the right ankle.  Plan today is right ankle Toradol injection.  Patient tolerated procedure well.  Will see how this does for her and check uric acid today.  This was found to be unremarkable.  Unclear if she is having recurrence of gout or if the ankle arthritis she has is more of a pain driver.  Plan to refer patient to Dr. Sharol Given for further evaluation of her right ankle pain.    Procedure Note  Patient: Mckenzie Rogers             Date of Birth: 02/19/1941           MRN: [id number]             Visit Date: 11/08/2022  Procedures: Visit Diagnoses:  1. History of gout     Medium Joint Inj: R ankle on 11/08/2022 8:46 PM Indications: pain, joint swelling and diagnostic evaluation Details: 22 G 1.5 in needle, fluoroscopy-guided anteromedial approach Medications: 3 mL lidocaine 1 %; 1 mL bupivacaine 0.5 % (3 cc of Marcaine mixed with 1 cc of Toradol) Outcome: tolerated well, no immediate complications Procedure, treatment alternatives, risks and benefits explained, specific risks discussed. Consent was given by the patient. Immediately prior to procedure a time out was called to verify the correct patient, procedure, equipment, support staff and site/side marked as  required. Patient was prepped and draped in the usual sterile fashion.        Follow-Up Instructions: No follow-ups on file.   Orders:  Orders Placed This Encounter  Procedures  . Uric acid   No orders of the defined types were placed in this encounter.   Imaging: No results found.  PMFS History: Patient Active Problem List   Diagnosis Date Noted  . Debility 10/05/2022  . Gout attack 10/04/2022  . UTI (urinary tract infection) 10/04/2022  . Arthritis of left knee 08/21/2022  . OA (osteoarthritis) of knee 08/19/2022  . S/P TKR (total knee replacement), left 08/19/2022  . Controlled type 2 diabetes mellitus without complication, without long-term  current use of insulin ([place]) 07/31/2021  . Genetic testing 03/14/2020  . Family history of pancreatic cancer   . Family history of kidney cancer   . Family history of lung cancer    Past Medical History:  Diagnosis Date  . Chronic kidney disease   . Complication of anesthesia    "hard time waking me up after foot surgery"  . Depression   . Diabetes ([place])    type 2  . Edema   . Family history of kidney cancer   . Family history of lung cancer   . Family history of pancreatic cancer   . Osteoarthritis of both knees   . Sleep apnea    "years ago" does not wear CPAP anymmore    Family History  Problem Relation Age of Onset  . Diabetes Maternal Aunt   . CVA Daughter   . Kidney failure Son   . Pancreatic cancer Father 79  . Kidney cancer Half-Sister 70       non-smoker  . Lung cancer Half-Sister 1       hx of smoking  . Cancer Half-Sister 53       unknown primary    Past Surgical History:  Procedure Laterality Date  . ABDOMINAL HYSTERECTOMY    . FOOT SURGERY Left   . TOTAL KNEE ARTHROPLASTY Left 08/19/2022   Procedure: LEFT TOTAL KNEE ARTHROPLASTY;  Surgeon: Meredith Pel, MD;  Location: [facility];  Service: Orthopedics;  Laterality: Left;  . TUBAL LIGATION     Social History   Occupational History  . Not on file  Tobacco Use  . Smoking status: Never  . Smokeless tobacco: Never  Vaping Use  . Vaping Use: Never used  Substance and Sexual Activity  . Alcohol use: Never  . Drug use: Never  . Sexual activity: Not on file

## 2022-11-15 ENCOUNTER — Telehealth: Payer: Self-pay | Admitting: Surgical

## 2022-11-15 NOTE — Telephone Encounter (Signed)
Pt called stating she had an appt last week and PA [name] did nt say if he wanted to see her for another appt and also he was suppose to send in meds for gout. Pt states she is in pain and it is swollen again. Did not see in office notes if Lurena Joiner wanted to see her back. Please call pt about this matter. Also please send medication into pharmacy on file. Pt phone number is [phone].

## 2022-11-16 DIAGNOSIS — E119 Type 2 diabetes mellitus without complications: Secondary | ICD-10-CM | POA: Diagnosis not present

## 2022-11-16 DIAGNOSIS — Z471 Aftercare following joint replacement surgery: Secondary | ICD-10-CM | POA: Diagnosis not present

## 2022-11-16 DIAGNOSIS — F32A Depression, unspecified: Secondary | ICD-10-CM | POA: Diagnosis not present

## 2022-11-16 DIAGNOSIS — I10 Essential (primary) hypertension: Secondary | ICD-10-CM | POA: Diagnosis not present

## 2022-11-16 DIAGNOSIS — M10071 Idiopathic gout, right ankle and foot: Secondary | ICD-10-CM | POA: Diagnosis not present

## 2022-11-16 DIAGNOSIS — M1711 Unilateral primary osteoarthritis, right knee: Secondary | ICD-10-CM | POA: Diagnosis not present

## 2022-11-16 DIAGNOSIS — E785 Hyperlipidemia, unspecified: Secondary | ICD-10-CM | POA: Diagnosis not present

## 2022-11-16 DIAGNOSIS — R350 Frequency of micturition: Secondary | ICD-10-CM | POA: Diagnosis not present

## 2022-11-16 DIAGNOSIS — M10061 Idiopathic gout, right knee: Secondary | ICD-10-CM | POA: Diagnosis not present

## 2022-11-17 NOTE — Telephone Encounter (Signed)
Hi Lauren, can you call patient and make her an appointment to see Dr. Sharol Given for evaluation of right ankle pain?  Best number to reach her is [phone] which is a new number for her.  If you cannot reach her at this number, the next best number is [phone]

## 2022-11-18 DIAGNOSIS — R350 Frequency of micturition: Secondary | ICD-10-CM | POA: Diagnosis not present

## 2022-11-18 DIAGNOSIS — Z471 Aftercare following joint replacement surgery: Secondary | ICD-10-CM | POA: Diagnosis not present

## 2022-11-18 DIAGNOSIS — M10071 Idiopathic gout, right ankle and foot: Secondary | ICD-10-CM | POA: Diagnosis not present

## 2022-11-18 DIAGNOSIS — M10061 Idiopathic gout, right knee: Secondary | ICD-10-CM | POA: Diagnosis not present

## 2022-11-18 DIAGNOSIS — F32A Depression, unspecified: Secondary | ICD-10-CM | POA: Diagnosis not present

## 2022-11-18 DIAGNOSIS — I10 Essential (primary) hypertension: Secondary | ICD-10-CM | POA: Diagnosis not present

## 2022-11-18 DIAGNOSIS — E785 Hyperlipidemia, unspecified: Secondary | ICD-10-CM | POA: Diagnosis not present

## 2022-11-18 DIAGNOSIS — M1711 Unilateral primary osteoarthritis, right knee: Secondary | ICD-10-CM | POA: Diagnosis not present

## 2022-11-18 DIAGNOSIS — E119 Type 2 diabetes mellitus without complications: Secondary | ICD-10-CM | POA: Diagnosis not present

## 2022-11-21 MED ORDER — LIDOCAINE HCL 1 % IJ SOLN
3.0000 mL | INTRAMUSCULAR | Status: AC | PRN
  Administered 2022-11-08: 3 mL

## 2022-11-21 MED ORDER — BUPIVACAINE HCL 0.5 % IJ SOLN
1.0000 mL | INTRAMUSCULAR | Status: AC | PRN
  Administered 2022-11-08: 1 mL via INTRA_ARTICULAR

## 2022-11-22 DIAGNOSIS — I10 Essential (primary) hypertension: Secondary | ICD-10-CM | POA: Diagnosis not present

## 2022-11-22 DIAGNOSIS — R350 Frequency of micturition: Secondary | ICD-10-CM | POA: Diagnosis not present

## 2022-11-22 DIAGNOSIS — F32A Depression, unspecified: Secondary | ICD-10-CM | POA: Diagnosis not present

## 2022-11-22 DIAGNOSIS — E785 Hyperlipidemia, unspecified: Secondary | ICD-10-CM | POA: Diagnosis not present

## 2022-11-22 DIAGNOSIS — E119 Type 2 diabetes mellitus without complications: Secondary | ICD-10-CM | POA: Diagnosis not present

## 2022-11-22 DIAGNOSIS — M1711 Unilateral primary osteoarthritis, right knee: Secondary | ICD-10-CM | POA: Diagnosis not present

## 2022-11-22 DIAGNOSIS — M10061 Idiopathic gout, right knee: Secondary | ICD-10-CM | POA: Diagnosis not present

## 2022-11-22 DIAGNOSIS — M10071 Idiopathic gout, right ankle and foot: Secondary | ICD-10-CM | POA: Diagnosis not present

## 2022-11-22 DIAGNOSIS — Z471 Aftercare following joint replacement surgery: Secondary | ICD-10-CM | POA: Diagnosis not present

## 2022-11-23 DIAGNOSIS — Z471 Aftercare following joint replacement surgery: Secondary | ICD-10-CM | POA: Diagnosis not present

## 2022-11-23 DIAGNOSIS — I10 Essential (primary) hypertension: Secondary | ICD-10-CM | POA: Diagnosis not present

## 2022-11-23 DIAGNOSIS — E119 Type 2 diabetes mellitus without complications: Secondary | ICD-10-CM | POA: Diagnosis not present

## 2022-11-23 DIAGNOSIS — M10071 Idiopathic gout, right ankle and foot: Secondary | ICD-10-CM | POA: Diagnosis not present

## 2022-11-23 DIAGNOSIS — F32A Depression, unspecified: Secondary | ICD-10-CM | POA: Diagnosis not present

## 2022-11-23 DIAGNOSIS — E785 Hyperlipidemia, unspecified: Secondary | ICD-10-CM | POA: Diagnosis not present

## 2022-11-23 DIAGNOSIS — M10061 Idiopathic gout, right knee: Secondary | ICD-10-CM | POA: Diagnosis not present

## 2022-11-23 DIAGNOSIS — M1711 Unilateral primary osteoarthritis, right knee: Secondary | ICD-10-CM | POA: Diagnosis not present

## 2022-11-23 DIAGNOSIS — R350 Frequency of micturition: Secondary | ICD-10-CM | POA: Diagnosis not present

## 2022-11-25 DIAGNOSIS — M10071 Idiopathic gout, right ankle and foot: Secondary | ICD-10-CM | POA: Diagnosis not present

## 2022-11-25 DIAGNOSIS — I129 Hypertensive chronic kidney disease with stage 1 through stage 4 chronic kidney disease, or unspecified chronic kidney disease: Secondary | ICD-10-CM | POA: Diagnosis not present

## 2022-11-25 DIAGNOSIS — E1122 Type 2 diabetes mellitus with diabetic chronic kidney disease: Secondary | ICD-10-CM | POA: Diagnosis not present

## 2022-11-25 DIAGNOSIS — E8809 Other disorders of plasma-protein metabolism, not elsewhere classified: Secondary | ICD-10-CM | POA: Diagnosis not present

## 2022-11-25 DIAGNOSIS — M10061 Idiopathic gout, right knee: Secondary | ICD-10-CM | POA: Diagnosis not present

## 2022-11-25 DIAGNOSIS — N182 Chronic kidney disease, stage 2 (mild): Secondary | ICD-10-CM | POA: Diagnosis not present

## 2022-11-25 DIAGNOSIS — M1711 Unilateral primary osteoarthritis, right knee: Secondary | ICD-10-CM | POA: Diagnosis not present

## 2022-11-25 DIAGNOSIS — M25461 Effusion, right knee: Secondary | ICD-10-CM | POA: Diagnosis not present

## 2022-11-29 ENCOUNTER — Ambulatory Visit (INDEPENDENT_AMBULATORY_CARE_PROVIDER_SITE_OTHER): Payer: Medicare HMO | Admitting: Orthopedic Surgery

## 2022-11-29 ENCOUNTER — Encounter: Payer: Self-pay | Admitting: Orthopedic Surgery

## 2022-11-29 DIAGNOSIS — M19071 Primary osteoarthritis, right ankle and foot: Secondary | ICD-10-CM | POA: Diagnosis not present

## 2022-11-29 DIAGNOSIS — Z8739 Personal history of other diseases of the musculoskeletal system and connective tissue: Secondary | ICD-10-CM | POA: Diagnosis not present

## 2022-11-29 MED ORDER — ALLOPURINOL 100 MG PO TABS: 100.0000 mg | ORAL_TABLET | Freq: Two times a day (BID) | ORAL | 3 refills | Status: DC

## 2022-11-29 NOTE — Progress Notes (Signed)
Office Visit Note   Patient: Mckenzie Rogers           Date of Birth: 08-21-1941           MRN: [id number] Visit Date: 11/29/2022              Requested by: Haywood Pao, MD [address] [place],  [place] [address] PCP: Osborne Casco, Fransico Him, MD  Chief Complaint  Patient presents with   Right Ankle - Pain      HPI: Patient is a 82 year old woman is seen for initial evaluation for right ankle pain.  Patient states she does have a history of gout involving multiple joints in her hands and feet.  She states she is not on colchicine or allopurinol.  Assessment & Plan: Visit Diagnoses:  1. History of gout   2. Arthritis of right ankle     Plan: Will start allopurinol 100 mg twice a day.  Repeat uric acid at follow-up in 4 weeks.  Patient was provided a prescription for [facility] discount medical for compression sock size extra-large.  Follow-Up Instructions: Return in about 4 weeks (around 12/27/2022).   Ortho Exam  Patient is alert, oriented, no adenopathy, well-dressed, normal affect, normal respiratory effort. Examination patient's radiograph shows a large dorsal spur over the distal tibia anteriorly and severe spurring at the talonavicular joint.  Patient does not have palpable pulses possibly secondary to swelling.  She has tense pitting edema right lower extremity calf measures 43 cm in circumference.  The ankle and foot are not tender to palpation.  Patient has no claudication symptoms.  She currently ambulates with a walker.  Imaging: No results found. No images are attached to the encounter.  Labs: Lab Results  Component Value Date   HGBA1C 6.3 (H) 10/04/2022   HGBA1C 7.5 (H) 08/09/2022   ESRSEDRATE 59 (H) 10/06/2022   CRP 4.1 (H) 10/06/2022   LABURIC 5.7 11/08/2022   LABURIC 4.0 10/06/2022   LABURIC 5.7 10/05/2022   REPTSTATUS 10/11/2022 FINAL 10/08/2022   REPTSTATUS 10/13/2022 FINAL 10/08/2022   GRAMSTAIN  10/08/2022    FEW WBC PRESENT, PREDOMINANTLY  MONONUCLEAR NO ORGANISMS SEEN    GRAMSTAIN  10/08/2022    FEW WBC PRESENT, PREDOMINANTLY MONONUCLEAR NO ORGANISMS SEEN    CULT  10/08/2022    NO GROWTH 3 DAYS Performed at [facility], [place] [address], [place], [place] [address]    CULT  10/08/2022    NO ANAEROBES ISOLATED Performed at [facility] [address], [place],  [address]    LABORGA ESCHERICHIA COLI (A) 10/04/2022     Lab Results  Component Value Date   ALBUMIN 3.2 (L) 10/05/2022   ALBUMIN 3.4 (L) 10/04/2022   ALBUMIN 3.1 (L) 09/13/2022   PREALBUMIN 9 (L) 10/05/2022    Lab Results  Component Value Date   MG 2.0 10/05/2022   No results found for: "VD25OH"  Lab Results  Component Value Date   PREALBUMIN 9 (L) 10/05/2022      Latest Ref Rng & Units 10/05/2022    1:20 PM 10/04/2022    6:51 PM 09/13/2022    5:18 PM  CBC EXTENDED  WBC 4.0 - 10.5 K/uL 3.2  4.3  4.8   RBC 3.87 - 5.11 MIL/uL 4.29  4.05  3.79   Hemoglobin 12.0 - 15.0 g/dL 12.8  12.1  11.4   HCT 36.0 - 46.0 % 40.9  38.6  36.0   Platelets 150 - 400 K/uL 205  217  264   NEUT# 1.7 - 7.7 K/uL  1.6  1.2   Lymph# 0.7 - 4.0 K/uL  2.0  2.6      There is no height or weight on file to calculate BMI.  Orders:  No orders of the defined types were placed in this encounter.  Meds ordered this encounter  Medications   allopurinol (ZYLOPRIM) 100 MG tablet    Sig: Take 1 tablet (100 mg total) by mouth 2 (two) times daily.    Dispense:  60 tablet    Refill:  3     Procedures: No procedures performed  Clinical Data: No additional findings.  ROS:  All other systems negative, except as noted in the HPI. Review of Systems  Objective: Vital Signs: LMP  (LMP Unknown)   Specialty Comments:  No specialty comments available.  PMFS History: Patient Active Problem List   Diagnosis Date Noted   Debility 10/05/2022   Gout attack 10/04/2022   UTI (urinary tract infection) 10/04/2022   Arthritis of left knee 08/21/2022   OA  (osteoarthritis) of knee 08/19/2022   S/P TKR (total knee replacement), left 08/19/2022   Controlled type 2 diabetes mellitus without complication, without long-term current use of insulin ([place]) 07/31/2021   Genetic testing 03/14/2020   Family history of pancreatic cancer    Family history of kidney cancer    Family history of lung cancer    Past Medical History:  Diagnosis Date   Chronic kidney disease    Complication of anesthesia    "hard time waking me up after foot surgery"   Depression    Diabetes ([place])    type 2   Edema    Family history of kidney cancer    Family history of lung cancer    Family history of pancreatic cancer    Osteoarthritis of both knees    Sleep apnea    "years ago" does not wear CPAP anymmore    Family History  Problem Relation Age of Onset   Diabetes Maternal Aunt    CVA Daughter    Kidney failure Son    Pancreatic cancer Father 5   Kidney cancer Half-Sister 36       non-smoker   Lung cancer Half-Sister 18       hx of smoking   Cancer Half-Sister 34       unknown primary    Past Surgical History:  Procedure Laterality Date   ABDOMINAL HYSTERECTOMY     FOOT SURGERY Left    TOTAL KNEE ARTHROPLASTY Left 08/19/2022   Procedure: LEFT TOTAL KNEE ARTHROPLASTY;  Surgeon: Meredith Pel, MD;  Location: [facility];  Service: Orthopedics;  Laterality: Left;   TUBAL LIGATION     Social History   Occupational History   Not on file  Tobacco Use   Smoking status: Never   Smokeless tobacco: Never  Vaping Use   Vaping Use: Never used  Substance and Sexual Activity   Alcohol use: Never   Drug use: Never   Sexual activity: Not on file

## 2022-12-01 DIAGNOSIS — M10061 Idiopathic gout, right knee: Secondary | ICD-10-CM | POA: Diagnosis not present

## 2022-12-01 DIAGNOSIS — N182 Chronic kidney disease, stage 2 (mild): Secondary | ICD-10-CM | POA: Diagnosis not present

## 2022-12-01 DIAGNOSIS — E8809 Other disorders of plasma-protein metabolism, not elsewhere classified: Secondary | ICD-10-CM | POA: Diagnosis not present

## 2022-12-01 DIAGNOSIS — M10071 Idiopathic gout, right ankle and foot: Secondary | ICD-10-CM | POA: Diagnosis not present

## 2022-12-01 DIAGNOSIS — E1122 Type 2 diabetes mellitus with diabetic chronic kidney disease: Secondary | ICD-10-CM | POA: Diagnosis not present

## 2022-12-01 DIAGNOSIS — M25461 Effusion, right knee: Secondary | ICD-10-CM | POA: Diagnosis not present

## 2022-12-01 DIAGNOSIS — M1711 Unilateral primary osteoarthritis, right knee: Secondary | ICD-10-CM | POA: Diagnosis not present

## 2022-12-01 DIAGNOSIS — I129 Hypertensive chronic kidney disease with stage 1 through stage 4 chronic kidney disease, or unspecified chronic kidney disease: Secondary | ICD-10-CM | POA: Diagnosis not present

## 2022-12-02 DIAGNOSIS — M25461 Effusion, right knee: Secondary | ICD-10-CM | POA: Diagnosis not present

## 2022-12-02 DIAGNOSIS — M10061 Idiopathic gout, right knee: Secondary | ICD-10-CM | POA: Diagnosis not present

## 2022-12-02 DIAGNOSIS — I129 Hypertensive chronic kidney disease with stage 1 through stage 4 chronic kidney disease, or unspecified chronic kidney disease: Secondary | ICD-10-CM | POA: Diagnosis not present

## 2022-12-02 DIAGNOSIS — M10071 Idiopathic gout, right ankle and foot: Secondary | ICD-10-CM | POA: Diagnosis not present

## 2022-12-02 DIAGNOSIS — M1711 Unilateral primary osteoarthritis, right knee: Secondary | ICD-10-CM | POA: Diagnosis not present

## 2022-12-02 DIAGNOSIS — E8809 Other disorders of plasma-protein metabolism, not elsewhere classified: Secondary | ICD-10-CM | POA: Diagnosis not present

## 2022-12-02 DIAGNOSIS — N182 Chronic kidney disease, stage 2 (mild): Secondary | ICD-10-CM | POA: Diagnosis not present

## 2022-12-02 DIAGNOSIS — E1122 Type 2 diabetes mellitus with diabetic chronic kidney disease: Secondary | ICD-10-CM | POA: Diagnosis not present

## 2022-12-07 DIAGNOSIS — M1711 Unilateral primary osteoarthritis, right knee: Secondary | ICD-10-CM | POA: Diagnosis not present

## 2022-12-07 DIAGNOSIS — M25461 Effusion, right knee: Secondary | ICD-10-CM | POA: Diagnosis not present

## 2022-12-07 DIAGNOSIS — E8809 Other disorders of plasma-protein metabolism, not elsewhere classified: Secondary | ICD-10-CM | POA: Diagnosis not present

## 2022-12-07 DIAGNOSIS — N182 Chronic kidney disease, stage 2 (mild): Secondary | ICD-10-CM | POA: Diagnosis not present

## 2022-12-07 DIAGNOSIS — M10061 Idiopathic gout, right knee: Secondary | ICD-10-CM | POA: Diagnosis not present

## 2022-12-07 DIAGNOSIS — M10071 Idiopathic gout, right ankle and foot: Secondary | ICD-10-CM | POA: Diagnosis not present

## 2022-12-07 DIAGNOSIS — I129 Hypertensive chronic kidney disease with stage 1 through stage 4 chronic kidney disease, or unspecified chronic kidney disease: Secondary | ICD-10-CM | POA: Diagnosis not present

## 2022-12-07 DIAGNOSIS — E1122 Type 2 diabetes mellitus with diabetic chronic kidney disease: Secondary | ICD-10-CM | POA: Diagnosis not present

## 2022-12-08 DIAGNOSIS — M10061 Idiopathic gout, right knee: Secondary | ICD-10-CM | POA: Diagnosis not present

## 2022-12-08 DIAGNOSIS — M25461 Effusion, right knee: Secondary | ICD-10-CM | POA: Diagnosis not present

## 2022-12-08 DIAGNOSIS — M1711 Unilateral primary osteoarthritis, right knee: Secondary | ICD-10-CM | POA: Diagnosis not present

## 2022-12-08 DIAGNOSIS — E1122 Type 2 diabetes mellitus with diabetic chronic kidney disease: Secondary | ICD-10-CM | POA: Diagnosis not present

## 2022-12-08 DIAGNOSIS — M10071 Idiopathic gout, right ankle and foot: Secondary | ICD-10-CM | POA: Diagnosis not present

## 2022-12-08 DIAGNOSIS — I129 Hypertensive chronic kidney disease with stage 1 through stage 4 chronic kidney disease, or unspecified chronic kidney disease: Secondary | ICD-10-CM | POA: Diagnosis not present

## 2022-12-08 DIAGNOSIS — E8809 Other disorders of plasma-protein metabolism, not elsewhere classified: Secondary | ICD-10-CM | POA: Diagnosis not present

## 2022-12-08 DIAGNOSIS — N182 Chronic kidney disease, stage 2 (mild): Secondary | ICD-10-CM | POA: Diagnosis not present

## 2022-12-09 DIAGNOSIS — N182 Chronic kidney disease, stage 2 (mild): Secondary | ICD-10-CM | POA: Diagnosis not present

## 2022-12-09 DIAGNOSIS — E669 Obesity, unspecified: Secondary | ICD-10-CM | POA: Diagnosis not present

## 2022-12-09 DIAGNOSIS — M170 Bilateral primary osteoarthritis of knee: Secondary | ICD-10-CM | POA: Diagnosis not present

## 2022-12-09 DIAGNOSIS — R6889 Other general symptoms and signs: Secondary | ICD-10-CM | POA: Diagnosis not present

## 2022-12-09 DIAGNOSIS — I129 Hypertensive chronic kidney disease with stage 1 through stage 4 chronic kidney disease, or unspecified chronic kidney disease: Secondary | ICD-10-CM | POA: Diagnosis not present

## 2022-12-09 DIAGNOSIS — Z794 Long term (current) use of insulin: Secondary | ICD-10-CM | POA: Diagnosis not present

## 2022-12-09 DIAGNOSIS — F321 Major depressive disorder, single episode, moderate: Secondary | ICD-10-CM | POA: Diagnosis not present

## 2022-12-09 DIAGNOSIS — E1122 Type 2 diabetes mellitus with diabetic chronic kidney disease: Secondary | ICD-10-CM | POA: Diagnosis not present

## 2022-12-09 DIAGNOSIS — E559 Vitamin D deficiency, unspecified: Secondary | ICD-10-CM | POA: Diagnosis not present

## 2022-12-14 DIAGNOSIS — N182 Chronic kidney disease, stage 2 (mild): Secondary | ICD-10-CM | POA: Diagnosis not present

## 2022-12-14 DIAGNOSIS — E1122 Type 2 diabetes mellitus with diabetic chronic kidney disease: Secondary | ICD-10-CM | POA: Diagnosis not present

## 2022-12-14 DIAGNOSIS — M1711 Unilateral primary osteoarthritis, right knee: Secondary | ICD-10-CM | POA: Diagnosis not present

## 2022-12-14 DIAGNOSIS — I129 Hypertensive chronic kidney disease with stage 1 through stage 4 chronic kidney disease, or unspecified chronic kidney disease: Secondary | ICD-10-CM | POA: Diagnosis not present

## 2022-12-14 DIAGNOSIS — M10071 Idiopathic gout, right ankle and foot: Secondary | ICD-10-CM | POA: Diagnosis not present

## 2022-12-14 DIAGNOSIS — M10061 Idiopathic gout, right knee: Secondary | ICD-10-CM | POA: Diagnosis not present

## 2022-12-14 DIAGNOSIS — M25461 Effusion, right knee: Secondary | ICD-10-CM | POA: Diagnosis not present

## 2022-12-14 DIAGNOSIS — E8809 Other disorders of plasma-protein metabolism, not elsewhere classified: Secondary | ICD-10-CM | POA: Diagnosis not present

## 2022-12-15 DIAGNOSIS — E1122 Type 2 diabetes mellitus with diabetic chronic kidney disease: Secondary | ICD-10-CM | POA: Diagnosis not present

## 2022-12-15 DIAGNOSIS — M25461 Effusion, right knee: Secondary | ICD-10-CM | POA: Diagnosis not present

## 2022-12-15 DIAGNOSIS — N182 Chronic kidney disease, stage 2 (mild): Secondary | ICD-10-CM | POA: Diagnosis not present

## 2022-12-15 DIAGNOSIS — M10061 Idiopathic gout, right knee: Secondary | ICD-10-CM | POA: Diagnosis not present

## 2022-12-15 DIAGNOSIS — I129 Hypertensive chronic kidney disease with stage 1 through stage 4 chronic kidney disease, or unspecified chronic kidney disease: Secondary | ICD-10-CM | POA: Diagnosis not present

## 2022-12-15 DIAGNOSIS — M1711 Unilateral primary osteoarthritis, right knee: Secondary | ICD-10-CM | POA: Diagnosis not present

## 2022-12-15 DIAGNOSIS — M10071 Idiopathic gout, right ankle and foot: Secondary | ICD-10-CM | POA: Diagnosis not present

## 2022-12-15 DIAGNOSIS — E8809 Other disorders of plasma-protein metabolism, not elsewhere classified: Secondary | ICD-10-CM | POA: Diagnosis not present

## 2022-12-20 DIAGNOSIS — E1122 Type 2 diabetes mellitus with diabetic chronic kidney disease: Secondary | ICD-10-CM | POA: Diagnosis not present

## 2022-12-20 DIAGNOSIS — N182 Chronic kidney disease, stage 2 (mild): Secondary | ICD-10-CM | POA: Diagnosis not present

## 2022-12-20 DIAGNOSIS — M1711 Unilateral primary osteoarthritis, right knee: Secondary | ICD-10-CM | POA: Diagnosis not present

## 2022-12-20 DIAGNOSIS — M10061 Idiopathic gout, right knee: Secondary | ICD-10-CM | POA: Diagnosis not present

## 2022-12-20 DIAGNOSIS — M10071 Idiopathic gout, right ankle and foot: Secondary | ICD-10-CM | POA: Diagnosis not present

## 2022-12-20 DIAGNOSIS — E8809 Other disorders of plasma-protein metabolism, not elsewhere classified: Secondary | ICD-10-CM | POA: Diagnosis not present

## 2022-12-20 DIAGNOSIS — M25461 Effusion, right knee: Secondary | ICD-10-CM | POA: Diagnosis not present

## 2022-12-20 DIAGNOSIS — I129 Hypertensive chronic kidney disease with stage 1 through stage 4 chronic kidney disease, or unspecified chronic kidney disease: Secondary | ICD-10-CM | POA: Diagnosis not present

## 2022-12-21 DIAGNOSIS — M10061 Idiopathic gout, right knee: Secondary | ICD-10-CM | POA: Diagnosis not present

## 2022-12-21 DIAGNOSIS — M25461 Effusion, right knee: Secondary | ICD-10-CM | POA: Diagnosis not present

## 2022-12-21 DIAGNOSIS — E1122 Type 2 diabetes mellitus with diabetic chronic kidney disease: Secondary | ICD-10-CM | POA: Diagnosis not present

## 2022-12-21 DIAGNOSIS — M10071 Idiopathic gout, right ankle and foot: Secondary | ICD-10-CM | POA: Diagnosis not present

## 2022-12-21 DIAGNOSIS — E8809 Other disorders of plasma-protein metabolism, not elsewhere classified: Secondary | ICD-10-CM | POA: Diagnosis not present

## 2022-12-21 DIAGNOSIS — M1711 Unilateral primary osteoarthritis, right knee: Secondary | ICD-10-CM | POA: Diagnosis not present

## 2022-12-21 DIAGNOSIS — N182 Chronic kidney disease, stage 2 (mild): Secondary | ICD-10-CM | POA: Diagnosis not present

## 2022-12-21 DIAGNOSIS — I129 Hypertensive chronic kidney disease with stage 1 through stage 4 chronic kidney disease, or unspecified chronic kidney disease: Secondary | ICD-10-CM | POA: Diagnosis not present

## 2022-12-27 ENCOUNTER — Ambulatory Visit (INDEPENDENT_AMBULATORY_CARE_PROVIDER_SITE_OTHER): Payer: Medicare HMO | Admitting: Orthopedic Surgery

## 2022-12-27 DIAGNOSIS — Z8739 Personal history of other diseases of the musculoskeletal system and connective tissue: Secondary | ICD-10-CM

## 2022-12-27 DIAGNOSIS — M19071 Primary osteoarthritis, right ankle and foot: Secondary | ICD-10-CM

## 2022-12-28 DIAGNOSIS — M25461 Effusion, right knee: Secondary | ICD-10-CM | POA: Diagnosis not present

## 2022-12-28 DIAGNOSIS — I129 Hypertensive chronic kidney disease with stage 1 through stage 4 chronic kidney disease, or unspecified chronic kidney disease: Secondary | ICD-10-CM | POA: Diagnosis not present

## 2022-12-28 DIAGNOSIS — E1122 Type 2 diabetes mellitus with diabetic chronic kidney disease: Secondary | ICD-10-CM | POA: Diagnosis not present

## 2022-12-28 DIAGNOSIS — M10071 Idiopathic gout, right ankle and foot: Secondary | ICD-10-CM | POA: Diagnosis not present

## 2022-12-28 DIAGNOSIS — M10061 Idiopathic gout, right knee: Secondary | ICD-10-CM | POA: Diagnosis not present

## 2022-12-28 DIAGNOSIS — N182 Chronic kidney disease, stage 2 (mild): Secondary | ICD-10-CM | POA: Diagnosis not present

## 2022-12-28 DIAGNOSIS — E8809 Other disorders of plasma-protein metabolism, not elsewhere classified: Secondary | ICD-10-CM | POA: Diagnosis not present

## 2022-12-28 DIAGNOSIS — M1711 Unilateral primary osteoarthritis, right knee: Secondary | ICD-10-CM | POA: Diagnosis not present

## 2023-01-02 ENCOUNTER — Encounter: Payer: Self-pay | Admitting: Orthopedic Surgery

## 2023-01-02 DIAGNOSIS — M19071 Primary osteoarthritis, right ankle and foot: Secondary | ICD-10-CM

## 2023-01-02 DIAGNOSIS — Z8739 Personal history of other diseases of the musculoskeletal system and connective tissue: Secondary | ICD-10-CM | POA: Diagnosis not present

## 2023-01-02 MED ORDER — LIDOCAINE HCL 1 % IJ SOLN
2.0000 mL | INTRAMUSCULAR | Status: AC | PRN
  Administered 2023-01-02: 2 mL

## 2023-01-02 MED ORDER — METHYLPREDNISOLONE ACETATE 40 MG/ML IJ SUSP
40.0000 mg | INTRAMUSCULAR | Status: AC | PRN
  Administered 2023-01-02: 40 mg via INTRA_ARTICULAR

## 2023-01-02 NOTE — Progress Notes (Signed)
Office Visit Note   Patient: Mckenzie Rogers           Date of Birth: 08-09-1941           MRN: [id number] Visit Date: 12/27/2022              Requested by: Gaspar Garbe, MD [address] [place],  [place] [address] PCP: Wylene Simmer, [name], MD  Chief Complaint  Patient presents with   Right Ankle - Follow-up      HPI: Patient is a 82 year old woman with history of gout and arthritis right ankle.  Patient is currently on allopurinol 100 mg twice a day.  Most recent uric acid was 5.7 on February 26.  Patient complains of pain over the lateral joint line pain with weightbearing pain with wearing compression socks.  Assessment & Plan: Visit Diagnoses:  1. History of gout   2. Arthritis of right ankle     Plan: Right ankle was injected she tolerated this well.  Follow-Up Instructions: Return in about 4 weeks (around 01/24/2023).   Ortho Exam  Patient is alert, oriented, no adenopathy, well-dressed, normal affect, normal respiratory effort. Examination there is swelling and tenderness to palpation around the right ankle there is no warmth no redness.  Imaging: No results found. No images are attached to the encounter.  Labs: Lab Results  Component Value Date   HGBA1C 6.3 (H) 10/04/2022   HGBA1C 7.5 (H) 08/09/2022   ESRSEDRATE 59 (H) 10/06/2022   CRP 4.1 (H) 10/06/2022   LABURIC 5.7 11/08/2022   LABURIC 4.0 10/06/2022   LABURIC 5.7 10/05/2022   REPTSTATUS 10/11/2022 FINAL 10/08/2022   REPTSTATUS 10/13/2022 FINAL 10/08/2022   GRAMSTAIN  10/08/2022    FEW WBC PRESENT, PREDOMINANTLY MONONUCLEAR NO ORGANISMS SEEN    GRAMSTAIN  10/08/2022    FEW WBC PRESENT, PREDOMINANTLY MONONUCLEAR NO ORGANISMS SEEN    CULT  10/08/2022    NO GROWTH 3 DAYS Performed at [facility], [address] [place], [place] [address]    CULT  10/08/2022    NO ANAEROBES ISOLATED Performed at [facility], [address] [place], [place] [address]    LABORGA  ESCHERICHIA COLI (A) 10/04/2022     Lab Results  Component Value Date   ALBUMIN 3.2 (L) 10/05/2022   ALBUMIN 3.4 (L) 10/04/2022   ALBUMIN 3.1 (L) 09/13/2022   PREALBUMIN 9 (L) 10/05/2022    Lab Results  Component Value Date   MG 2.0 10/05/2022   No results found for: "VD25OH"  Lab Results  Component Value Date   PREALBUMIN 9 (L) 10/05/2022      Latest Ref Rng & Units 10/05/2022    1:20 PM 10/04/2022    6:51 PM 09/13/2022    5:18 PM  CBC EXTENDED  WBC 4.0 - 10.5 K/uL 3.2  4.3  4.8   RBC 3.87 - 5.11 MIL/uL 4.29  4.05  3.79   Hemoglobin 12.0 - 15.0 g/dL 46.9  62.9  52.8   HCT 36.0 - 46.0 % 40.9  38.6  36.0   Platelets 150 - 400 K/uL 205  217  264   NEUT# 1.7 - 7.7 K/uL  1.6  1.2   Lymph# 0.7 - 4.0 K/uL  2.0  2.6      There is no height or weight on file to calculate BMI.  Orders:  No orders of the defined types were placed in this encounter.  No orders of the defined types were placed  in this encounter.    Procedures: Medium Joint Inj on 01/02/2023 10:20 AM Indications: pain and diagnostic evaluation Details: 22 G 1.5 in needle, anteromedial approach Medications: 2 mL lidocaine 1 %; 40 mg methylPREDNISolone acetate 40 MG/ML Outcome: tolerated well, no immediate complications Procedure, treatment alternatives, risks and benefits explained, specific risks discussed. Consent was given by the patient. Immediately prior to procedure a time out was called to verify the correct patient, procedure, equipment, support staff and site/side marked as required. Patient was prepped and draped in the usual sterile fashion.      Clinical Data: No additional findings.  ROS:  All other systems negative, except as noted in the HPI. Review of Systems  Objective: Vital Signs: LMP  (LMP Unknown)   Specialty Comments:  No specialty comments available.  PMFS History: Patient Active Problem List   Diagnosis Date Noted   Debility 10/05/2022   Gout attack 10/04/2022   UTI  (urinary tract infection) 10/04/2022   Arthritis of left knee 08/21/2022   OA (osteoarthritis) of knee 08/19/2022   S/P TKR (total knee replacement), left 08/19/2022   Controlled type 2 diabetes mellitus without complication, without long-term current use of insulin 07/31/2021   Genetic testing 03/14/2020   Family history of pancreatic cancer    Family history of kidney cancer    Family history of lung cancer    Past Medical History:  Diagnosis Date   Chronic kidney disease    Complication of anesthesia    "hard time waking me up after foot surgery"   Depression    Diabetes    type 2   Edema    Family history of kidney cancer    Family history of lung cancer    Family history of pancreatic cancer    Osteoarthritis of both knees    Sleep apnea    "years ago" does not wear CPAP anymmore    Family History  Problem Relation Age of Onset   Diabetes Maternal Aunt    CVA Daughter    Kidney failure Son    Pancreatic cancer Father 35   Kidney cancer Half-Sister 69       non-smoker   Lung cancer Half-Sister 108       hx of smoking   Cancer Half-Sister 48       unknown primary    Past Surgical History:  Procedure Laterality Date   ABDOMINAL HYSTERECTOMY     FOOT SURGERY Left    TOTAL KNEE ARTHROPLASTY Left 08/19/2022   Procedure: LEFT TOTAL KNEE ARTHROPLASTY;  Surgeon: Cammy Copa, MD;  Location: [facility];  Service: Orthopedics;  Laterality: Left;   TUBAL LIGATION     Social History   Occupational History   Not on file  Tobacco Use   Smoking status: Never   Smokeless tobacco: Never  Vaping Use   Vaping Use: Never used  Substance and Sexual Activity   Alcohol use: Never   Drug use: Never   Sexual activity: Not on file

## 2023-01-04 DIAGNOSIS — I129 Hypertensive chronic kidney disease with stage 1 through stage 4 chronic kidney disease, or unspecified chronic kidney disease: Secondary | ICD-10-CM | POA: Diagnosis not present

## 2023-01-04 DIAGNOSIS — M1711 Unilateral primary osteoarthritis, right knee: Secondary | ICD-10-CM | POA: Diagnosis not present

## 2023-01-04 DIAGNOSIS — M10071 Idiopathic gout, right ankle and foot: Secondary | ICD-10-CM | POA: Diagnosis not present

## 2023-01-04 DIAGNOSIS — E8809 Other disorders of plasma-protein metabolism, not elsewhere classified: Secondary | ICD-10-CM | POA: Diagnosis not present

## 2023-01-04 DIAGNOSIS — E1122 Type 2 diabetes mellitus with diabetic chronic kidney disease: Secondary | ICD-10-CM | POA: Diagnosis not present

## 2023-01-04 DIAGNOSIS — N182 Chronic kidney disease, stage 2 (mild): Secondary | ICD-10-CM | POA: Diagnosis not present

## 2023-01-04 DIAGNOSIS — M10061 Idiopathic gout, right knee: Secondary | ICD-10-CM | POA: Diagnosis not present

## 2023-01-04 DIAGNOSIS — M25461 Effusion, right knee: Secondary | ICD-10-CM | POA: Diagnosis not present

## 2023-01-06 DIAGNOSIS — M1711 Unilateral primary osteoarthritis, right knee: Secondary | ICD-10-CM | POA: Diagnosis not present

## 2023-01-06 DIAGNOSIS — N182 Chronic kidney disease, stage 2 (mild): Secondary | ICD-10-CM | POA: Diagnosis not present

## 2023-01-06 DIAGNOSIS — M10061 Idiopathic gout, right knee: Secondary | ICD-10-CM | POA: Diagnosis not present

## 2023-01-06 DIAGNOSIS — I129 Hypertensive chronic kidney disease with stage 1 through stage 4 chronic kidney disease, or unspecified chronic kidney disease: Secondary | ICD-10-CM | POA: Diagnosis not present

## 2023-01-06 DIAGNOSIS — M10071 Idiopathic gout, right ankle and foot: Secondary | ICD-10-CM | POA: Diagnosis not present

## 2023-01-06 DIAGNOSIS — M25461 Effusion, right knee: Secondary | ICD-10-CM | POA: Diagnosis not present

## 2023-01-06 DIAGNOSIS — E8809 Other disorders of plasma-protein metabolism, not elsewhere classified: Secondary | ICD-10-CM | POA: Diagnosis not present

## 2023-01-06 DIAGNOSIS — E1122 Type 2 diabetes mellitus with diabetic chronic kidney disease: Secondary | ICD-10-CM | POA: Diagnosis not present

## 2023-01-10 ENCOUNTER — Encounter: Payer: Self-pay | Admitting: Podiatry

## 2023-01-10 ENCOUNTER — Ambulatory Visit (INDEPENDENT_AMBULATORY_CARE_PROVIDER_SITE_OTHER): Payer: Medicare HMO | Admitting: Podiatry

## 2023-01-10 VITALS — BP 135/72

## 2023-01-10 DIAGNOSIS — M79675 Pain in left toe(s): Secondary | ICD-10-CM | POA: Diagnosis not present

## 2023-01-10 DIAGNOSIS — R6889 Other general symptoms and signs: Secondary | ICD-10-CM | POA: Diagnosis not present

## 2023-01-10 DIAGNOSIS — B351 Tinea unguium: Secondary | ICD-10-CM | POA: Diagnosis not present

## 2023-01-10 DIAGNOSIS — M79674 Pain in right toe(s): Secondary | ICD-10-CM

## 2023-01-10 DIAGNOSIS — M170 Bilateral primary osteoarthritis of knee: Secondary | ICD-10-CM | POA: Insufficient documentation

## 2023-01-10 DIAGNOSIS — M2141 Flat foot [pes planus] (acquired), right foot: Secondary | ICD-10-CM

## 2023-01-10 DIAGNOSIS — Z6833 Body mass index (BMI) 33.0-33.9, adult: Secondary | ICD-10-CM | POA: Insufficient documentation

## 2023-01-10 DIAGNOSIS — M2142 Flat foot [pes planus] (acquired), left foot: Secondary | ICD-10-CM | POA: Diagnosis not present

## 2023-01-10 DIAGNOSIS — E119 Type 2 diabetes mellitus without complications: Secondary | ICD-10-CM

## 2023-01-10 DIAGNOSIS — F321 Major depressive disorder, single episode, moderate: Secondary | ICD-10-CM | POA: Insufficient documentation

## 2023-01-10 NOTE — Progress Notes (Unsigned)
ANNUAL DIABETIC FOOT EXAM  Subjective: 82 Mckenzie Rogers presents today {jgcomplaint:23593}.  Chief Complaint  Patient presents with   Nail Problem    [facility] BS-127 A1C-6.2 PCP-Rogers PCP VST-11/2022    Patient confirms h/o diabetes.  Patient relates 82 year h/o diabetes diabetes.  Patient denies any h/o foot wounds.  Patient has h/o foot ulcer of {jgPodToeLocator:23637}, which healed via help of ***.  Patient has h/o amputation(s): {jgamp:23617}.  Patient endorses symptoms of foot numbness.   Patient endorses symptoms of foot tingling.  Patient endorses symptoms of burning in feet.  Patient endorses symptoms of pins/needles sensation in feet.  Patient denies any numbness, tingling, burning, or pins/needle sensation in feet.  Patient has been diagnosed with neuropathy.  Risk factors: {jgriskfactors:24044}.  Rogers, Mckenzie Koh, MD is patient's PCP.  Past Medical History:  Diagnosis Date   Chronic kidney disease    Complication of anesthesia    "hard time waking me up after foot surgery"   Depression    Diabetes ([facility])    type 2   Edema    Family history of kidney cancer    Family history of lung cancer    Family history of pancreatic cancer    Osteoarthritis of both knees    Sleep apnea    "years ago" does not wear CPAP anymmore   Patient Active Problem List   Diagnosis Date Noted   Current moderate episode of major depressive disorder without prior episode ([facility]) 01/10/2023   BMI 33.0-33.9,adult 01/10/2023   Primary osteoarthritis of both knees 01/10/2023   Debility 10/05/2022   Gout attack 10/04/2022   UTI (urinary tract infection) 10/04/2022   Arthritis of left knee 08/21/2022   OA (osteoarthritis) of knee 08/19/2022   S/P TKR (total knee replacement), left 08/19/2022   Controlled type 2 diabetes mellitus without complication, without long-term current use of insulin ([facility]) 07/31/2021   Genetic testing 03/14/2020   Family history of  pancreatic cancer    Family history of kidney cancer    Family history of lung cancer    Long term (current) use of insulin ([facility]) 11/01/2018   Chronic kidney disease due to hypertension 08/24/2018   Non-thrombocytopenic purpura (HCC) 01/14/2016   Overactive bladder 01/14/2016   Obstructive sleep apnea (adult) (pediatric) 12/10/2009   Diabetic renal disease ([facility]CC) 05/10/2009   Disorder of bone 05/10/2009   Gastro-esophageal reflux disease without esophagitis 05/10/2009   Past Surgical History:  Procedure Laterality Date   ABDOMINAL HYSTERECTOMY     FOOT SURGERY Left    TOTAL KNEE ARTHROPLASTY Left 08/19/2022   Procedure: LEFT TOTAL KNEE ARTHROPLASTY;  Surgeon: Cammy Copa, MD;  Location: [facility];  Service: Orthopedics;  Laterality: Left;   TUBAL LIGATION     Current Outpatient Medications on File Prior to Visit  Medication Sig Dispense Refill   FARXIGA 10 MG TABS tablet Take 10 mg by mouth daily.     acetaminophen (TYLENOL) 650 MG CR tablet Take 650 mg by mouth every 8 (eight) hours as needed for pain.     albuterol (PROVENTIL HFA;VENTOLIN HFA) 108 (90 Base) MCG/ACT inhaler Inhale 1-2 puffs into the lungs every 6 (six) hours as needed for wheezing or shortness of breath. (Patient not taking: Reported on 10/06/2022) 1 Inhaler 0   allopurinol (ZYLOPRIM) 100 MG tablet Take 1 tablet (100 mg total) by mouth 2 (two) times daily. 60 tablet 3   atorvastatin (LIPITOR) 80 MG tablet Take 80 mg by mouth at bedtime.     bisacodyl (  DULCOLAX) 5 MG EC tablet Take 2 tablets (10 mg total) by mouth daily as needed for moderate constipation. 30 tablet 0   colchicine 0.6 MG tablet Take 1 tablet (0.6 mg total) by mouth 2 (two) times daily for 5 days. During acute gout attack 10 tablet 0   docusate sodium (COLACE) 100 MG capsule Take 1 capsule (100 mg total) by mouth 2 (two) times daily. (Patient taking differently: Take 100 mg by mouth 2 (two) times daily as needed for mild constipation.) 10 capsule 0    escitalopram (LEXAPRO) 20 MG tablet Take 20 mg by mouth daily.     HYDROmorphone (DILAUDID) 2 MG tablet Take 0.5 tablets (1 mg total) by mouth every 6 (six) hours as needed for severe pain. 10 tablet 0   insulin aspart (NOVOLOG) 100 UNIT/ML injection Inject 0-9 Units into the skin 3 (three) times daily with meals. Sliding scale CBG 70 - 120: 0 units CBG 121 - 150: 1 unit,  CBG 151 - 200: 2 units,  CBG 201 - 250: 3 units,  CBG 251 - 300: 5 units,  CBG 301 - 350: 7 units,  CBG 351 - 400: 9 units   CBG > 400: 9 units and notify your MD 10 mL 11   Insulin Glargine (BASAGLAR KWIKPEN) 100 UNIT/ML Inject 16 Units into the skin daily.     methocarbamol (ROBAXIN) 500 MG tablet Take 1 tablet (500 mg total) by mouth every 8 (eight) hours as needed for muscle spasms. 30 tablet 0   mirtazapine (REMERON) 7.5 MG tablet Take 1 tablet (7.5 mg total) by mouth at bedtime.     MYRBETRIQ 25 MG TB24 tablet Take 25 mg by mouth daily.     polyethylene glycol (MIRALAX / GLYCOLAX) 17 g packet Take 17 g by mouth daily as needed. 14 each 0   predniSONE (DELTASONE) 10 MG tablet Take 30mg  PO daily for 1 day, then 20mg  daily x 3 days, then 10mg  daily for 3 days, then OFF 9 tablet 0   TOVIAZ 4 MG TB24 tablet Take 4 mg by mouth daily.     No current facility-administered medications on file prior to visit.    Allergies  Allergen Reactions   Aspirin Other (See Comments)    Pt unsure of reaction   Clorazepate Dipotassium Other (See Comments)    "Can't function"   Ketoprofen Other (See Comments)    Pt unsure of reaction   Oxycodone-Aspirin Swelling and Other (See Comments)    Tongue swells     Social History   Occupational History   Not on file  Tobacco Use   Smoking status: Never   Smokeless tobacco: Never  Vaping Use   Vaping Use: Never used  Substance and Sexual Activity   Alcohol use: Never   Drug use: Never   Sexual activity: Not on file   Family History  Problem Relation Age of Onset   Diabetes Maternal  Aunt    CVA Daughter    Kidney failure Son    Pancreatic cancer Father 58   Kidney cancer Half-Sister 27       non-smoker   Lung cancer Half-Sister 17       hx of smoking   Cancer Half-Sister 48       unknown primary    There is no immunization history on file for this patient.   Review of Systems: Negative except as noted in the HPI.   Objective: Vitals:   01/10/23 0920  BP: 135/72  Yaretzi A Kuipers is a pleasant 82 y.o. female in NAD. AAO X 3.  Vascular Examination: {jgvascular:23595}  Dermatological Examination: {jgderm:23598}  Neurological Examination: {jgneuro:23601::"Protective sensation intact 5/5 intact bilaterally with 10g monofilament b/l.","Vibratory sensation intact b/l.","Proprioception intact bilaterally."}  Musculoskeletal Examination: {jgmsk:23600}  Footwear Assessment: Does the patient wear appropriate shoes? {Yes,No}. Does the patient need inserts/orthotics? {Yes,No}.  Lab Results  Component Value Date   HGBA1C 6.3 (H) 10/04/2022   No results found. ADA Risk Categorization: Low Risk :  Patient has all of the following: Intact protective sensation No prior foot ulcer  No severe deformity Pedal pulses present  High Risk  Patient has one or more of the following: Loss of protective sensation Absent pedal pulses Severe Foot deformity History of foot ulcer  Assessment: No diagnosis found.   Plan: {jgplan:23602::"-Patient/POA to call should there be question/concern in the interim."} Return in about 3 months (around 04/11/2023).  Freddie Breech, DPM

## 2023-01-18 DIAGNOSIS — M1711 Unilateral primary osteoarthritis, right knee: Secondary | ICD-10-CM | POA: Diagnosis not present

## 2023-01-18 DIAGNOSIS — M10061 Idiopathic gout, right knee: Secondary | ICD-10-CM | POA: Diagnosis not present

## 2023-01-18 DIAGNOSIS — M10071 Idiopathic gout, right ankle and foot: Secondary | ICD-10-CM | POA: Diagnosis not present

## 2023-01-18 DIAGNOSIS — M25461 Effusion, right knee: Secondary | ICD-10-CM | POA: Diagnosis not present

## 2023-01-18 DIAGNOSIS — I129 Hypertensive chronic kidney disease with stage 1 through stage 4 chronic kidney disease, or unspecified chronic kidney disease: Secondary | ICD-10-CM | POA: Diagnosis not present

## 2023-01-18 DIAGNOSIS — E8809 Other disorders of plasma-protein metabolism, not elsewhere classified: Secondary | ICD-10-CM | POA: Diagnosis not present

## 2023-01-18 DIAGNOSIS — E1122 Type 2 diabetes mellitus with diabetic chronic kidney disease: Secondary | ICD-10-CM | POA: Diagnosis not present

## 2023-01-18 DIAGNOSIS — N182 Chronic kidney disease, stage 2 (mild): Secondary | ICD-10-CM | POA: Diagnosis not present

## 2023-01-19 DIAGNOSIS — R6889 Other general symptoms and signs: Secondary | ICD-10-CM | POA: Diagnosis not present

## 2023-01-27 ENCOUNTER — Ambulatory Visit: Payer: Medicare HMO | Admitting: Orthopedic Surgery

## 2023-02-01 DIAGNOSIS — R6889 Other general symptoms and signs: Secondary | ICD-10-CM | POA: Diagnosis not present

## 2023-02-08 ENCOUNTER — Encounter: Payer: Self-pay | Admitting: Orthopedic Surgery

## 2023-02-08 ENCOUNTER — Ambulatory Visit (INDEPENDENT_AMBULATORY_CARE_PROVIDER_SITE_OTHER): Payer: Medicare HMO | Admitting: Orthopedic Surgery

## 2023-02-08 DIAGNOSIS — M19071 Primary osteoarthritis, right ankle and foot: Secondary | ICD-10-CM | POA: Diagnosis not present

## 2023-02-08 DIAGNOSIS — M76821 Posterior tibial tendinitis, right leg: Secondary | ICD-10-CM | POA: Diagnosis not present

## 2023-02-08 DIAGNOSIS — R6889 Other general symptoms and signs: Secondary | ICD-10-CM | POA: Diagnosis not present

## 2023-02-08 NOTE — Progress Notes (Signed)
Office Visit Note   Patient: Mckenzie Rogers           Date of Birth: 02/07/1941           MRN: [id number] Visit Date: 02/08/2023              Requested by: Gaspar Garbe, MD [address] [place],  [place] [address] PCP: Wylene Simmer, [name], MD  Chief Complaint  Patient presents with   Right Ankle - Follow-up      HPI: Patient is a 82 year old woman who presents in follow-up for her right ankle.  She is status post a steroid injection which significantly improved her ankle symptoms.  Patient states she still has pain and swelling over the posterior medial and lateral aspect of her ankle as well as over the sinus Tarsi.  Assessment & Plan: Visit Diagnoses:  1. Arthritis of right ankle   2. Insufficiency of right posterior tibial tendon     Plan: Will place her in an ASO to see if this will help provide support for her posterior tibial tendon insufficiency.  Patient states she is not interested in surgery.  Follow-Up Instructions: Return if symptoms worsen or fail to improve.   Ortho Exam  Patient is alert, oriented, no adenopathy, well-dressed, normal affect, normal respiratory effort. Examination patient has a fixed valgus deformity of the hindfoot.  She has a pronated valgus forefoot.  She is tender to palpation over the sinus Tarsi as well as over the posterior tibial tendon and the peroneal tendons.  Patient cannot do a toe raise.  Imaging: No results found. No images are attached to the encounter.  Labs: Lab Results  Component Value Date   HGBA1C 6.3 (H) 10/04/2022   HGBA1C 7.5 (H) 08/09/2022   ESRSEDRATE 59 (H) 10/06/2022   CRP 4.1 (H) 10/06/2022   LABURIC 5.7 11/08/2022   LABURIC 4.0 10/06/2022   LABURIC 5.7 10/05/2022   REPTSTATUS 10/11/2022 FINAL 10/08/2022   REPTSTATUS 10/13/2022 FINAL 10/08/2022   GRAMSTAIN  10/08/2022    FEW WBC PRESENT, PREDOMINANTLY MONONUCLEAR NO ORGANISMS SEEN    GRAMSTAIN  10/08/2022    FEW WBC PRESENT, PREDOMINANTLY  MONONUCLEAR NO ORGANISMS SEEN    CULT  10/08/2022    NO GROWTH 3 DAYS Performed at [facility], [address], [place], [place] [address]    CULT  10/08/2022    NO ANAEROBES ISOLATED Performed at [facility], [address], [place], [place] [address]    LABORGA ESCHERICHIA COLI (A) 10/04/2022     Lab Results  Component Value Date   ALBUMIN 3.2 (L) 10/05/2022   ALBUMIN 3.4 (L) 10/04/2022   ALBUMIN 3.1 (L) 09/13/2022   PREALBUMIN 9 (L) 10/05/2022    Lab Results  Component Value Date   MG 2.0 10/05/2022   No results found for: "VD25OH"  Lab Results  Component Value Date   PREALBUMIN 9 (L) 10/05/2022      Latest Ref Rng & Units 10/05/2022    1:20 PM 10/04/2022    6:51 PM 09/13/2022    5:18 PM  CBC EXTENDED  WBC 4.0 - 10.5 K/uL 3.2  4.3  4.8   RBC 3.87 - 5.11 MIL/uL 4.29  4.05  3.79   Hemoglobin 12.0 - 15.0 g/dL 13.2  44.0  10.2   HCT 36.0 - 46.0 % 40.9  38.6  36.0   Platelets 150 - 400 K/uL 205  217  264   NEUT# 1.7 - 7.7 K/uL  1.6  1.2   Lymph# 0.7 - 4.0 K/uL  2.0  2.6      There is no height or weight on file to calculate BMI.  Orders:  No orders of the defined types were placed in this encounter.  No orders of the defined types were placed in this encounter.    Procedures: No procedures performed  Clinical Data: No additional findings.  ROS:  All other systems negative, except as noted in the HPI. Review of Systems  Objective: Vital Signs: LMP  (LMP Unknown)   Specialty Comments:  No specialty comments available.  PMFS History: Patient Active Problem List   Diagnosis Date Noted   Current moderate episode of major depressive disorder without prior episode (HCC) 01/10/2023   BMI 33.0-33.9,adult 01/10/2023   Primary osteoarthritis of both knees 01/10/2023   Debility 10/05/2022   Gout attack 10/04/2022   UTI (urinary tract infection) 10/04/2022   Arthritis of left knee 08/21/2022   OA (osteoarthritis) of knee 08/19/2022   S/P  TKR (total knee replacement), left 08/19/2022   Controlled type 2 diabetes mellitus without complication, without long-term current use of insulin (HCC) 07/31/2021   Genetic testing 03/14/2020   Family history of pancreatic cancer    Family history of kidney cancer    Family history of lung cancer    Long term (current) use of insulin (HCC) 11/01/2018   Chronic kidney disease due to hypertension 08/24/2018   Non-thrombocytopenic purpura (HCC) 01/14/2016   Overactive bladder 01/14/2016   Obstructive sleep apnea (adult) (pediatric) 12/10/2009   Diabetic renal disease (HCC) 05/10/2009   Disorder of bone 05/10/2009   Gastro-esophageal reflux disease without esophagitis 05/10/2009   Past Medical History:  Diagnosis Date   Chronic kidney disease    Complication of anesthesia    "hard time waking me up after foot surgery"   Depression    Diabetes (HCC)    type 2   Edema    Family history of kidney cancer    Family history of lung cancer    Family history of pancreatic cancer    Osteoarthritis of both knees    Sleep apnea    "years ago" does not wear CPAP anymmore    Family History  Problem Relation Age of Onset   Diabetes Maternal Aunt    CVA Daughter    Kidney failure Son    Pancreatic cancer Father 62   Kidney cancer Half-Sister 66       non-smoker   Lung cancer Half-Sister 70       hx of smoking   Cancer Half-Sister 76       unknown primary    Past Surgical History:  Procedure Laterality Date   ABDOMINAL HYSTERECTOMY     FOOT SURGERY Left    TOTAL KNEE ARTHROPLASTY Left 08/19/2022   Procedure: LEFT TOTAL KNEE ARTHROPLASTY;  Surgeon: Cammy Copa, MD;  Location: [facility] OR;  Service: Orthopedics;  Laterality: Left;   TUBAL LIGATION     Social History   Occupational History   Not on file  Tobacco Use   Smoking status: Never   Smokeless tobacco: Never  Vaping Use   Vaping Use: Never used  Substance and Sexual Activity   Alcohol use: Never   Drug use: Never    Sexual activity: Not on file

## 2023-02-10 ENCOUNTER — Other Ambulatory Visit: Payer: Self-pay | Admitting: Orthopedic Surgery

## 2023-02-10 ENCOUNTER — Telehealth: Payer: Self-pay | Admitting: Orthopedic Surgery

## 2023-02-10 DIAGNOSIS — M19071 Primary osteoarthritis, right ankle and foot: Secondary | ICD-10-CM

## 2023-02-10 DIAGNOSIS — M76821 Posterior tibial tendinitis, right leg: Secondary | ICD-10-CM

## 2023-02-10 NOTE — Telephone Encounter (Signed)
Patient asking if she needs an RX for outpatient P/T. Please advise

## 2023-02-10 NOTE — Telephone Encounter (Signed)
You saw this pt on 02/08/2023 for PTTI and she was given an ASO she is not interested in surgery at this time. Pt is questioning if she needs PT. Please advise.

## 2023-02-10 NOTE — Telephone Encounter (Signed)
Pt wants to have PT upstairs. I will put in referral.

## 2023-02-22 ENCOUNTER — Encounter: Payer: Self-pay | Admitting: Physical Therapy

## 2023-02-22 ENCOUNTER — Ambulatory Visit (INDEPENDENT_AMBULATORY_CARE_PROVIDER_SITE_OTHER): Payer: Medicare HMO | Admitting: Physical Therapy

## 2023-02-22 DIAGNOSIS — R600 Localized edema: Secondary | ICD-10-CM

## 2023-02-22 DIAGNOSIS — M25571 Pain in right ankle and joints of right foot: Secondary | ICD-10-CM

## 2023-02-22 DIAGNOSIS — M6281 Muscle weakness (generalized): Secondary | ICD-10-CM

## 2023-02-22 DIAGNOSIS — M25671 Stiffness of right ankle, not elsewhere classified: Secondary | ICD-10-CM

## 2023-02-22 DIAGNOSIS — R262 Difficulty in walking, not elsewhere classified: Secondary | ICD-10-CM | POA: Diagnosis not present

## 2023-02-22 DIAGNOSIS — R6889 Other general symptoms and signs: Secondary | ICD-10-CM | POA: Diagnosis not present

## 2023-02-22 NOTE — Therapy (Addendum)
OUTPATIENT PHYSICAL THERAPY LOWER EXTREMITY EVALUATION Referring diagnosis? M19.071 Treatment diagnosis? (if different than referring diagnosis) M25.571 What was this (referring dx) caused by? []  Surgery []  Fall [x]  Ongoing issue [x]  Arthritis []  Other: ____________  Laterality: [x]  Rt []  Lt []  Both  Check all possible CPT codes:  *CHOOSE 10 OR LESS*    [x]  97110 (Therapeutic Exercise)  []  92507 (SLP Treatment)  [x]  97112 (Neuro Re-ed)   []  92526 (Swallowing Treatment)   [x]  97116 (Gait Training)   []  [id number] (Cognitive Training, 1st 15 minutes) [x]  97140 (Manual Therapy)   []  97130 (Cognitive Training, each add'l 15 minutes)  []  97164 (Re-evaluation)                              []  Other, List CPT Code ____________  [x]  97530 (Therapeutic Activities)     []  97535 (Self Care)   []  All codes above (97110 - 97535)  []  97012 (Mechanical Traction)  [x]  97014 (E-stim Unattended)  []  97032 (E-stim manual)  [x]  97033 (Ionto)  [x]  97035 (Ultrasound) []  97750 (Physical Performance Training) []  [id number] (Aquatic Therapy) []  97016 (Vasopneumatic Device) []  [id number] (Paraffin) []  97034 (Contrast Bath) []  97597 (Wound Care 1st 20 sq cm) []  97598 (Wound Care each add'l 20 sq cm) []  97760 (Orthotic Fabrication, Fitting, Training Initial) []  [id number] (Prosthetic Management and Training Initial) []  [id number] (Orthotic or Prosthetic Training/ Modification Subsequent)    Patient Name: Mckenzie Rogers MRN: [id number] DOB:Sep 06, 1941, 82 y.o., female Today's Date: 02/22/2023  END OF SESSION:  PT End of Session - 02/22/23 1008     Visit Number 1    Number of Visits 12    Date for PT Re-Evaluation 04/19/23    Authorization Type [employer]    PT Start Time 0930    PT Stop Time 1015    PT Time Calculation (min) 45 min    Activity Tolerance Patient tolerated treatment well    Behavior During Therapy WFL for tasks assessed/performed             Past Medical History:  Diagnosis Date   Chronic  kidney disease    Complication of anesthesia    "hard time waking me up after foot surgery"   Depression    Diabetes (HCC)    type 2   Edema    Family history of kidney cancer    Family history of lung cancer    Family history of pancreatic cancer    Osteoarthritis of both knees    Sleep apnea    "years ago" does not wear CPAP anymmore   Past Surgical History:  Procedure Laterality Date   ABDOMINAL HYSTERECTOMY     FOOT SURGERY Left    TOTAL KNEE ARTHROPLASTY Left 08/19/2022   Procedure: LEFT TOTAL KNEE ARTHROPLASTY;  Surgeon: Cammy Copa, MD;  Location: [facility] OR;  Service: Orthopedics;  Laterality: Left;   TUBAL LIGATION     Patient Active Problem List   Diagnosis Date Noted   Current moderate episode of major depressive disorder without prior episode (HCC) 01/10/2023   BMI 33.0-33.9,adult 01/10/2023   Primary osteoarthritis of both knees 01/10/2023   Debility 10/05/2022   Gout attack 10/04/2022   UTI (urinary tract infection) 10/04/2022   Arthritis of left knee 08/21/2022   OA (osteoarthritis) of knee 08/19/2022   S/P TKR (total knee replacement), left 08/19/2022   Controlled type 2 diabetes mellitus without complication, without long-term current use of  insulin (HCC) 07/31/2021   Genetic testing 03/14/2020   Family history of pancreatic cancer    Family history of kidney cancer    Family history of lung cancer    Long term (current) use of insulin (HCC) 11/01/2018   Chronic kidney disease due to hypertension 08/24/2018   Non-thrombocytopenic purpura (HCC) 01/14/2016   Overactive bladder 01/14/2016   Obstructive sleep apnea (adult) (pediatric) 12/10/2009   Diabetic renal disease (HCC) 05/10/2009   Disorder of bone 05/10/2009   Gastro-esophageal reflux disease without esophagitis 05/10/2009    PCP: Gaspar Garbe, MD   REFERRING PROVIDER: Nadara Mustard, MD  REFERRING DIAG: [phone] (ICD-10-CM) - Arthritis of right ankle [phone] (ICD-10-CM) - Insufficiency  of right posterior tibial tendon  THERAPY DIAG:  Pain in right ankle and joints of right foot  Stiffness of right ankle, not elsewhere classified  Muscle weakness (generalized)  Difficulty in walking, not elsewhere classified  Localized edema  Rationale for Evaluation and Treatment: Rehabilitation  ONSET DATE: Rt ankle pain since December 2023  SUBJECTIVE:   SUBJECTIVE STATEMENT: She is having pain in Rt ankle, she did have injection there and was recommended to use ASO brace (she states she can not get this on by herself so she arrives with compression sleeve on her Rt ankle). She does express she has gout in her knee, ankle, toe. She did have Lt TKA in 2023 but relays it is doing good and does not bother her. She relays her ankle will swell and get irritated. She is using RW for ambulation due to her ankle pain and she feels off balance. She relays her son has recently passed away so grief is incorporated into all of this as well.  PERTINENT HISTORY:PMH:Dep, OA,S/P TKR Lt 08/2022, DM, Gout  PAIN:  Are you having pain? Yes: NPRS scale: 7/10 Pain location: Rt medial ankle, some Rt knee pain as well Pain description: Swelling, tingles, shocking pain Aggravating factors: hurts at night when she lays down, prolonged standing Relieving factors: ankle pumps  PRECAUTIONS: None  WEIGHT BEARING RESTRICTIONS: No  FALLS:  Has patient fallen in last 6 months? 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 No acute abnormality seen.  PATIENT SURVEYS:  Eval: FOTO 48% functional intake, goal is 57%  COGNITION: Eval: Overall cognitive status: Within functional limits for tasks assessed       EDEMA:  Moderate edema in Rt ankle and foot present at  eval  MUSCLE LENGTH: Eval: Tight heelcords Rt  POSTURE: Rt ankle valgus and R knee valgus noted at eval  PALPATION: Tender to palpation in medial Rt ankle, ankle hypomobility into EV/INV  LOWER EXTREMITY ROM:  Active ROM/PROM Right eval   Hip flexion    Hip extension    Hip abduction    Hip adduction    Hip internal rotation    Hip external rotation    Knee flexion    Knee extension    Ankle dorsiflexion 5/   Ankle plantarflexion 38/   Ankle inversion 0/5   Ankle eversion 5/10    (Blank rows = not tested)  LOWER EXTREMITY MMT:  MMT in supine Right eval Left eval  Hip flexion    Hip extension    Hip abduction  Hip adduction    Hip internal rotation    Hip external rotation    Knee flexion    Knee extension    Ankle dorsiflexion 5   Ankle plantarflexion 4+   Ankle inversion 3   Ankle eversion 4+    (Blank rows = not tested)  LOWER EXTREMITY SPECIAL TESTS:    FUNCTIONAL TESTS:  Eval: Timed up and go (TUG): 20.9 sec with RW  Tandem balance test 4 seconds with Rt leg back, 20 seconds with Lt leg back.  GAIT: Eval Comments: antalgic gait on Rt, uses RW for now but would like to wean off of this as PLOF was community ambulation without AD   TODAY'S TREATMENT:  Eval HEP creation and review with demonstration and trial set preformed, see below for details -Vasopnuematic device X 10 min, medium compression, 34 deg to Rt ankle     PATIENT EDUCATION: Education details: HEP, PT plan of care Person educated: Patient Education method: Explanation, Demonstration, Verbal cues, and Handouts Education comprehension: verbalized understanding and needs further education   HOME EXERCISE PROGRAM: Access Code: [email or web address] URL: [email or web address] Date: 02/22/2023 Prepared by: Ivery Quale  Exercises - Seated Ankle Alphabet  - 2 x daily - 6 x weekly - 1 sets - 1-2 reps - Seated Ankle Circles  - 2 x daily - 6 x weekly - 1 sets - 15 reps -  Isometric Ankle Inversion  - 2 x daily - 6 x weekly - 2 sets - 10 reps - 5 sec hold - Standing Tandem Balance with Counter Support  - 2 x daily - 6 x weekly - 1 sets - 3 reps - 30 hold - Side Stepping with Counter Support  - 2 x daily - 6 x weekly - 1 sets - 3-5 reps - Heel Toe Raises with Counter Support  - 2 x daily - 6 x weekly - 2 sets - 10 reps  ASSESSMENT:  CLINICAL IMPRESSION: Patient referred to PT for Rt ankle pain with posterior tibial tendon insufficiency. She has ankle hypomobiliy, ankle INV weakness, ankle valgus deformity, ankle/foot edema, and difficulty walking secondary to ankle pain and decreased balance. Patient will benefit from skilled PT to address below impairments, limitations and improve overall function.  OBJECTIVE IMPAIRMENTS: decreased activity tolerance, difficulty walking, decreased balance, decreased endurance, decreased mobility, decreased ROM, decreased strength, impaired flexibility, LE use, postural dysfunction, and pain.  ACTIVITY LIMITATIONS: bending, lifting, carry, locomotion, cleaning, community activity, driving  PERSONAL FACTORS: PMH:Dep, OA,S/P TKR Lt 08/2022, DM, gout are also affecting patient's functional outcome.  REHAB POTENTIAL: Fair    CLINICAL DECISION MAKING: Evolving/moderate complexity  EVALUATION COMPLEXITY: Moderate    GOALS: Short term PT Goals Target date: 03/22/2023   Pt will be I and compliant with HEP. Baseline:  Goal status: New Pt will decrease pain by 25% overall Baseline:7 Goal status: New  Long term PT goals Target date:04/19/2023   Pt will improve Rt ankle AROM to WFL (10 deg DF and EV) to improve functional mobility Baseline: Goal status: New Pt will improve Rt ankle strength to at least 4+/5 in supine MMT to improve functional strength Baseline: Goal status: New Pt will improve FOTO to at least 57% functional to show improved function Baseline: Goal status: New Pt will reduce pain by overall 50% (3.5 or  less out of 10) overall with usual activity Baseline:7 Goal status: New Pt will reduce pain to overall less than 2-3/10 with usual activity and work activity. Baseline: Goal status: New Pt  will be able to ambulate community distances at least 750 ft with LRAD Baseline: Goal status: New 7. Patient will improve TUG time to less than 14 seconds to show improved gait speed and balance Goal status: new  PLAN: PT FREQUENCY: 1-2 times per week   PT DURATION: 6-8 weeks  PLANNED INTERVENTIONS (unless contraindicated): aquatic PT, Canalith repositioning, cryotherapy, Electrical stimulation, Iontophoresis with 4 mg/ml dexamethasome, Moist heat, traction, Ultrasound, gait training, Therapeutic exercise, balance training, neuromuscular re-education, patient/family education, prosthetic training, manual techniques, passive ROM, dry needling, taping, vasopnuematic device, vestibular, spinal manipulations, joint manipulations  PLAN FOR NEXT SESSION: review HEP, needs ankle strength, ankle ROM, balance and gait progressions as tolerated. Vaso for swelling.     April Manson, PT,DPT 02/22/2023, 10:37 AM

## 2023-02-25 ENCOUNTER — Encounter: Payer: Medicare HMO | Admitting: Rehabilitative and Restorative Service Providers"

## 2023-02-28 ENCOUNTER — Ambulatory Visit (INDEPENDENT_AMBULATORY_CARE_PROVIDER_SITE_OTHER): Payer: Medicare HMO | Admitting: Physical Therapy

## 2023-02-28 ENCOUNTER — Encounter: Payer: Self-pay | Admitting: Physical Therapy

## 2023-02-28 DIAGNOSIS — R6889 Other general symptoms and signs: Secondary | ICD-10-CM | POA: Diagnosis not present

## 2023-02-28 DIAGNOSIS — R600 Localized edema: Secondary | ICD-10-CM | POA: Diagnosis not present

## 2023-02-28 DIAGNOSIS — R262 Difficulty in walking, not elsewhere classified: Secondary | ICD-10-CM

## 2023-02-28 DIAGNOSIS — M6281 Muscle weakness (generalized): Secondary | ICD-10-CM | POA: Diagnosis not present

## 2023-02-28 DIAGNOSIS — M25671 Stiffness of right ankle, not elsewhere classified: Secondary | ICD-10-CM | POA: Diagnosis not present

## 2023-02-28 DIAGNOSIS — M25571 Pain in right ankle and joints of right foot: Secondary | ICD-10-CM

## 2023-02-28 NOTE — Therapy (Signed)
OUTPATIENT PHYSICAL THERAPY TREATMENT  Referring diagnosis? M19.071 Treatment diagnosis? (if different than referring diagnosis) M25.571 What was this (referring dx) caused by? []  Surgery []  Fall [x]  Ongoing issue [x]  Arthritis []  Other: ____________  Laterality: [x]  Rt []  Lt []  Both  Check all possible CPT codes:  *CHOOSE 10 OR LESS*    [x]  97110 (Therapeutic Exercise)  []  92507 (SLP Treatment)  [x]  97112 (Neuro Re-ed)   []  92526 (Swallowing Treatment)   [x]  97116 (Gait Training)   []  [phone] (Cognitive Training, 1st 15 minutes) [x]  97140 (Manual Therapy)   []  97130 (Cognitive Training, each add'l 15 minutes)  []  97164 (Re-evaluation)                              []  Other, List CPT Code ____________  [x]  97530 (Therapeutic Activities)     []  97535 (Self Care)   []  All codes above (97110 - 97535)  []  97012 (Mechanical Traction)  [x]  97014 (E-stim Unattended)  []  97032 (E-stim manual)  [x]  97033 (Ionto)  [x]  97035 (Ultrasound) []  97750 (Physical Performance Training) []  [id number] (Aquatic Therapy) []  97016 (Vasopneumatic Device) []  [id number] (Paraffin) []  97034 (Contrast Bath) []  97597 (Wound Care 1st 20 sq cm) []  97598 (Wound Care each add'l 20 sq cm) []  97760 (Orthotic Fabrication, Fitting, Training Initial) []  [id number] (Prosthetic Management and Training Initial) []  [id number] (Orthotic or Prosthetic Training/ Modification Subsequent)    Patient Name: Mckenzie Rogers MRN: [id number] DOB:18-Jan-1941, 82 y.o., female Today's Date: 02/28/2023  END OF SESSION:  PT End of Session - 02/28/23 1215     Visit Number 2    Number of Visits 12    Date for PT Re-Evaluation 04/19/23    Authorization Type [employer]    PT Start Time 1155   arrives late due to transportation   PT Stop Time 1230    PT Time Calculation (min) 35 min    Activity Tolerance Patient tolerated treatment well    Behavior During Therapy WFL for tasks assessed/performed              Past Medical History:   Diagnosis Date   Chronic kidney disease    Complication of anesthesia    "hard time waking me up after foot surgery"   Depression    Diabetes (HCC)    type 2   Edema    Family history of kidney cancer    Family history of lung cancer    Family history of pancreatic cancer    Osteoarthritis of both knees    Sleep apnea    "years ago" does not wear CPAP anymmore   Past Surgical History:  Procedure Laterality Date   ABDOMINAL HYSTERECTOMY     FOOT SURGERY Left    TOTAL KNEE ARTHROPLASTY Left 08/19/2022   Procedure: LEFT TOTAL KNEE ARTHROPLASTY;  Surgeon: Cammy Copa, MD;  Location: [facility] OR;  Service: Orthopedics;  Laterality: Left;   TUBAL LIGATION     Patient Active Problem List   Diagnosis Date Noted   Current moderate episode of major depressive disorder without prior episode (HCC) 01/10/2023   BMI 33.0-33.9,adult 01/10/2023   Primary osteoarthritis of both knees 01/10/2023   Debility 10/05/2022   Gout attack 10/04/2022   UTI (urinary tract infection) 10/04/2022   Arthritis of left knee 08/21/2022   OA (osteoarthritis) of knee 08/19/2022   S/P TKR (total knee replacement), left 08/19/2022   Controlled type 2 diabetes mellitus without  complication, without long-term current use of insulin (HCC) 07/31/2021   Genetic testing 03/14/2020   Family history of pancreatic cancer    Family history of kidney cancer    Family history of lung cancer    Long term (current) use of insulin (HCC) 11/01/2018   Chronic kidney disease due to hypertension 08/24/2018   Non-thrombocytopenic purpura (HCC) 01/14/2016   Overactive bladder 01/14/2016   Obstructive sleep apnea (adult) (pediatric) 12/10/2009   Diabetic renal disease (HCC) 05/10/2009   Disorder of bone 05/10/2009   Gastro-esophageal reflux disease without esophagitis 05/10/2009    PCP: Gaspar Garbe, MD   REFERRING PROVIDER: Nadara Mustard, MD  REFERRING DIAG: [phone] (ICD-10-CM) - Arthritis of right ankle [phone]  (ICD-10-CM) - Insufficiency of right posterior tibial tendon  THERAPY DIAG:  Pain in right ankle and joints of right foot  Stiffness of right ankle, not elsewhere classified  Muscle weakness (generalized)  Difficulty in walking, not elsewhere classified  Localized edema  Rationale for Evaluation and Treatment: Rehabilitation  ONSET DATE: Rt ankle pain since December 2023  SUBJECTIVE:   SUBJECTIVE STATEMENT: She relays her ankle feels better overall, is having more knee pain after she has been trying to walk more PERTINENT HISTORY:PMH:Dep, OA,S/P TKR Lt 08/2022, DM, Gout  PAIN:  Are you having pain? Yes: NPRS scale: 2 in ankle, 6 in knee today/10 Pain location: Rt medial ankle, some Rt knee pain as well Pain description: Swelling, tingles, shocking pain Aggravating factors: hurts at night when she lays down, prolonged standing Relieving factors: ankle pumps  PRECAUTIONS: None  WEIGHT BEARING RESTRICTIONS: No  FALLS:  Has patient fallen in last 6 months? 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 No acute abnormality seen.  PATIENT SURVEYS:  Eval: FOTO 48% functional intake, goal is 57%  COGNITION: Eval: Overall cognitive status: Within functional limits for tasks assessed       EDEMA:  Moderate edema in Rt ankle and foot present at eval  MUSCLE LENGTH: Eval: Tight heelcords Rt  POSTURE: Rt ankle valgus and R knee valgus noted at eval  PALPATION: Tender to palpation in medial Rt ankle, ankle hypomobility into EV/INV  LOWER EXTREMITY ROM:  Active ROM/PROM Right eval   Hip flexion    Hip extension    Hip abduction    Hip adduction    Hip internal rotation    Hip external rotation    Knee flexion    Knee extension     Ankle dorsiflexion 5/   Ankle plantarflexion 38/   Ankle inversion 0/5   Ankle eversion 5/10    (Blank rows = not tested)  LOWER EXTREMITY MMT:  MMT in supine Right eval Left eval  Hip flexion    Hip extension    Hip abduction    Hip adduction    Hip internal rotation    Hip external rotation    Knee flexion    Knee extension    Ankle dorsiflexion 5   Ankle plantarflexion 4+   Ankle inversion 3   Ankle eversion 4+    (Blank rows = not tested)  LOWER EXTREMITY SPECIAL TESTS:    FUNCTIONAL TESTS:  Eval: Timed up and go (TUG): 20.9 sec with RW  Tandem balance test 4 seconds  with Rt leg back, 20 seconds with Lt leg back.  GAIT: Eval Comments: antalgic gait on Rt, uses RW for now but would like to wean off of this as PLOF was community ambulation without AD   TODAY'S TREATMENT:  02/28/23 Reviewed how to don/doff her Rt ankle ASO brace and she needs verbal cues and demo Rt ankle alphabet X 1 Rt ankle circles X 15 CW and CCW Rt ankle inversion isometrics 5 sec X 10 Rt ankle 4 way with red X 10 each plane Tandem balance 30 sec X 2 bilat Heel and toe raises X 10 with UE support  Eval HEP creation and review with demonstration and trial set preformed, see below for details    PATIENT EDUCATION: Education details: HEP, PT plan of care Person educated: Patient Education method: Explanation, Demonstration, Verbal cues, and Handouts Education comprehension: verbalized understanding and needs further education   HOME EXERCISE PROGRAM: Access Code: [email or web address] URL: [email or web address] Date: 02/22/2023 Prepared by: Ivery Quale  Exercises - Seated Ankle Alphabet  - 2 x daily - 6 x weekly - 1 sets - 1-2 reps - Seated Ankle Circles  - 2 x daily - 6 x weekly - 1 sets - 15 reps - Isometric Ankle Inversion  - 2 x daily - 6 x weekly - 2 sets - 10 reps - 5 sec hold - Standing Tandem Balance with Counter Support  - 2 x daily - 6 x weekly - 1 sets - 3 reps -  30 hold - Side Stepping with Counter Support  - 2 x daily - 6 x weekly - 1 sets - 3-5 reps - Heel Toe Raises with Counter Support  - 2 x daily - 6 x weekly - 2 sets - 10 reps  ASSESSMENT:  CLINICAL IMPRESSION: She does appear to have better ankle Inversion activation since eval however this is still weak and will need continued strength work. I did show her how to don on/off her ankle ASO brace today as well.   OBJECTIVE IMPAIRMENTS: decreased activity tolerance, difficulty walking, decreased balance, decreased endurance, decreased mobility, decreased ROM, decreased strength, impaired flexibility, LE use, postural dysfunction, and pain.  ACTIVITY LIMITATIONS: bending, lifting, carry, locomotion, cleaning, community activity, driving  PERSONAL FACTORS: PMH:Dep, OA,S/P TKR Lt 08/2022, DM, gout are also affecting patient's functional outcome.  REHAB POTENTIAL: Fair    CLINICAL DECISION MAKING: Evolving/moderate complexity  EVALUATION COMPLEXITY: Moderate    GOALS: Short term PT Goals Target date: 03/22/2023   Pt will be I and compliant with HEP. Baseline:  Goal status: New Pt will decrease pain by 25% overall Baseline:7 Goal status: New  Long term PT goals Target date:04/19/2023   Pt will improve Rt ankle AROM to WFL (10 deg DF and EV) to improve functional mobility Baseline: Goal status: New Pt will improve Rt ankle strength to at least 4+/5 in supine MMT to improve functional strength Baseline: Goal status: New Pt will improve FOTO to at least 57% functional to show improved function Baseline: Goal status: New Pt will reduce pain by overall 50% (3.5 or less out of 10) overall with usual activity Baseline:7 Goal status: New Pt will reduce pain to overall less than 2-3/10 with usual activity and work activity. Baseline: Goal status: New Pt will be able to ambulate community distances at least 750 ft with LRAD Baseline: Goal status: New 7. Patient will improve TUG time  to less than 14 seconds to show improved gait speed and balance Goal status: new  PLAN:  PT FREQUENCY: 1-2 times per week   PT DURATION: 6-8 weeks  PLANNED INTERVENTIONS (unless contraindicated): aquatic PT, Canalith repositioning, cryotherapy, Electrical stimulation, Iontophoresis with 4 mg/ml dexamethasome, Moist heat, traction, Ultrasound, gait training, Therapeutic exercise, balance training, neuromuscular re-education, patient/family education, prosthetic training, manual techniques, passive ROM, dry needling, taping, vasopnuematic device, vestibular, spinal manipulations, joint manipulations  PLAN FOR NEXT SESSION:  needs ankle strength paticularly INV/EV, ankle ROM, balance and gait progressions as tolerated. Vaso if desire.      April Manson, PT,DPT 02/28/2023, 12:16 PM

## 2023-03-04 ENCOUNTER — Encounter: Payer: Self-pay | Admitting: Rehabilitative and Restorative Service Providers"

## 2023-03-04 ENCOUNTER — Ambulatory Visit (INDEPENDENT_AMBULATORY_CARE_PROVIDER_SITE_OTHER): Payer: Medicare HMO | Admitting: Rehabilitative and Restorative Service Providers"

## 2023-03-04 DIAGNOSIS — M6281 Muscle weakness (generalized): Secondary | ICD-10-CM

## 2023-03-04 DIAGNOSIS — R600 Localized edema: Secondary | ICD-10-CM

## 2023-03-04 DIAGNOSIS — R6889 Other general symptoms and signs: Secondary | ICD-10-CM | POA: Diagnosis not present

## 2023-03-04 DIAGNOSIS — M25671 Stiffness of right ankle, not elsewhere classified: Secondary | ICD-10-CM | POA: Diagnosis not present

## 2023-03-04 DIAGNOSIS — R262 Difficulty in walking, not elsewhere classified: Secondary | ICD-10-CM

## 2023-03-04 DIAGNOSIS — M25571 Pain in right ankle and joints of right foot: Secondary | ICD-10-CM | POA: Diagnosis not present

## 2023-03-04 NOTE — Therapy (Signed)
OUTPATIENT PHYSICAL THERAPY TREATMENT  Referring diagnosis? M19.071 Treatment diagnosis? (if different than referring diagnosis) M25.571 What was this (referring dx) caused by? []  Surgery []  Fall [x]  Ongoing issue [x]  Arthritis []  Other: ____________  Laterality: [x]  Rt []  Lt []  Both  Check all possible CPT codes:  *CHOOSE 10 OR LESS*    [x]  97110 (Therapeutic Exercise)  []  92507 (SLP Treatment)  [x]  97112 (Neuro Re-ed)   []  41660 (Swallowing Treatment)   [x]  97116 (Gait Training)   []  [id number] (Cognitive Training, 1st 15 minutes) [x]  97140 (Manual Therapy)   []  97130 (Cognitive Training, each add'l 15 minutes)  []  97164 (Re-evaluation)                              []  Other, List CPT Code ____________  [x]  97530 (Therapeutic Activities)     []  97535 (Self Care)   []  All codes above (97110 - 97535)  []  97012 (Mechanical Traction)  [x]  97014 (E-stim Unattended)  []  97032 (E-stim manual)  [x]  97033 (Ionto)  [x]  97035 (Ultrasound) []  97750 (Physical Performance Training) []  [id number] (Aquatic Therapy) []  97016 (Vasopneumatic Device) []  [id number] (Paraffin) []  97034 (Contrast Bath) []  97597 (Wound Care 1st 20 sq cm) []  97598 (Wound Care each add'l 20 sq cm) []  97760 (Orthotic Fabrication, Fitting, Training Initial) []  [id number] (Prosthetic Management and Training Initial) []  [id number] (Orthotic or Prosthetic Training/ Modification Subsequent)    Patient Name: Mckenzie Rogers MRN: [id number] DOB:12-May-1941, 82 y.o., female Today's Date: 03/04/2023  END OF SESSION:  PT End of Session - 03/04/23 1426     Visit Number 3    Number of Visits 12    Date for PT Re-Evaluation 04/19/23    Authorization Type [employer]    Authorization - Visit Number 3    Progress Note Due on Visit 10    PT Start Time 1345    PT Stop Time 1427    PT Time Calculation (min) 42 min    Activity Tolerance Patient tolerated treatment well;No increased pain    Behavior During Therapy WFL for tasks assessed/performed                Past Medical History:  Diagnosis Date   Chronic kidney disease    Complication of anesthesia    "hard time waking me up after foot surgery"   Depression    Diabetes (HCC)    type 2   Edema    Family history of kidney cancer    Family history of lung cancer    Family history of pancreatic cancer    Osteoarthritis of both knees    Sleep apnea    "years ago" does not wear CPAP anymmore   Past Surgical History:  Procedure Laterality Date   ABDOMINAL HYSTERECTOMY     FOOT SURGERY Left    TOTAL KNEE ARTHROPLASTY Left 08/19/2022   Procedure: LEFT TOTAL KNEE ARTHROPLASTY;  Surgeon: Cammy Copa, MD;  Location: [facility] OR;  Service: Orthopedics;  Laterality: Left;   TUBAL LIGATION     Patient Active Problem List   Diagnosis Date Noted   Current moderate episode of major depressive disorder without prior episode (HCC) 01/10/2023   BMI 33.0-33.9,adult 01/10/2023   Primary osteoarthritis of both knees 01/10/2023   Debility 10/05/2022   Gout attack 10/04/2022   UTI (urinary tract infection) 10/04/2022   Arthritis of left knee 08/21/2022   OA (osteoarthritis) of knee 08/19/2022   S/P  TKR (total knee replacement), left 08/19/2022   Controlled type 2 diabetes mellitus without complication, without long-term current use of insulin (HCC) 07/31/2021   Genetic testing 03/14/2020   Family history of pancreatic cancer    Family history of kidney cancer    Family history of lung cancer    Long term (current) use of insulin (HCC) 11/01/2018   Chronic kidney disease due to hypertension 08/24/2018   Non-thrombocytopenic purpura (HCC) 01/14/2016   Overactive bladder 01/14/2016   Obstructive sleep apnea (adult) (pediatric) 12/10/2009   Diabetic renal disease (HCC) 05/10/2009   Disorder of bone 05/10/2009   Gastro-esophageal reflux disease without esophagitis 05/10/2009    PCP: Gaspar Garbe, MD   REFERRING PROVIDER: Nadara Mustard, MD  REFERRING DIAG: [phone]  (ICD-10-CM) - Arthritis of right ankle [phone] (ICD-10-CM) - Insufficiency of right posterior tibial tendon  THERAPY DIAG:  Pain in right ankle and joints of right foot  Stiffness of right ankle, not elsewhere classified  Muscle weakness (generalized)  Difficulty in walking, not elsewhere classified  Localized edema  Rationale for Evaluation and Treatment: Rehabilitation  ONSET DATE: Rt ankle pain since December 2023  SUBJECTIVE:   SUBJECTIVE STATEMENT: Mckenzie Rogers notes good HEP compliance, particularly with the Thera-Band.  PERTINENT HISTORY:PMH:Dep, OA,S/P TKR Lt 08/2022, DM, Gout  PAIN:  Are you having pain? Yes: NPRS scale: 2-6/10 for ankle and knee/10 Pain location: Rt medial ankle, some Rt knee pain as well Pain description: Swelling, tingles, shocking pain Aggravating factors: hurts at night when she lays down, prolonged standing Relieving factors: ankle pumps  PRECAUTIONS: None  WEIGHT BEARING RESTRICTIONS: No  FALLS:  Has patient fallen in last 6 months? 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 No acute abnormality seen.  PATIENT SURVEYS:  Eval: FOTO 48% functional intake, goal is 57%  COGNITION: Eval: Overall cognitive status: Within functional limits for tasks assessed       EDEMA:  Moderate edema in Rt ankle and foot present at eval  MUSCLE LENGTH: Eval: Tight heelcords Rt  POSTURE: Rt ankle valgus and R knee valgus noted at eval  PALPATION: Tender to palpation in medial Rt ankle, ankle hypomobility into EV/INV  LOWER EXTREMITY ROM:  Active ROM/PROM Right eval   Hip flexion    Hip extension    Hip abduction    Hip adduction    Hip internal rotation    Hip external rotation    Knee flexion    Knee  extension    Ankle dorsiflexion 5/   Ankle plantarflexion 38/   Ankle inversion 0/5   Ankle eversion 5/10    (Blank rows = not tested)  LOWER EXTREMITY MMT:  MMT in supine Right eval Left eval  Hip flexion    Hip extension    Hip abduction    Hip adduction    Hip internal rotation    Hip external rotation    Knee flexion    Knee extension    Ankle dorsiflexion 5   Ankle plantarflexion 4+   Ankle inversion 3   Ankle eversion 4+    (Blank rows = not tested)  LOWER EXTREMITY SPECIAL TESTS:    FUNCTIONAL TESTS:  Eval: Timed up and go (TUG): 20.9 sec with RW  Tandem balance test  4 seconds with Rt leg back, 20 seconds with Lt leg back.  GAIT: Eval Comments: antalgic gait on Rt, uses RW for now but would like to wean off of this as PLOF was community ambulation without AD   TODAY'S TREATMENT:  03/04/2023 Thera-Band 4-way Red 2 sets of 10 in each direction (inversion and eversion particularly weak) Heel to toe raises 2 sets of 10 for 3 seconds Ankle Inversion Isometrics 10 x 5 seconds  Tandem balance 5 x 20 seconds   02/28/23 Reviewed how to don/doff her Rt ankle ASO brace and she needs verbal cues and demo Rt ankle alphabet X 1 Rt ankle circles X 15 CW and CCW Rt ankle inversion isometrics 5 sec X 10 Rt ankle 4 way with red X 10 each plane Tandem balance 30 sec X 2 bilat Heel and toe raises X 10 with UE support  Eval HEP creation and review with demonstration and trial set preformed, see below for details    PATIENT EDUCATION: Education details: HEP, PT plan of care Person educated: Patient Education method: Explanation, Demonstration, Verbal cues, and Handouts Education comprehension: verbalized understanding and needs further education   HOME EXERCISE PROGRAM: Access Code: [email or web address] URL: [email or web address] Date: 02/22/2023 Prepared by: Ivery Quale  Exercises - Seated Ankle Alphabet  - 2 x daily - 6 x weekly - 1 sets - 1-2 reps -  Seated Ankle Circles  - 2 x daily - 6 x weekly - 1 sets - 15 reps - Isometric Ankle Inversion  - 2 x daily - 6 x weekly - 2 sets - 10 reps - 5 sec hold - Standing Tandem Balance with Counter Support  - 2 x daily - 6 x weekly - 1 sets - 3 reps - 30 hold - Side Stepping with Counter Support  - 2 x daily - 6 x weekly - 1 sets - 3-5 reps - Heel Toe Raises with Counter Support  - 2 x daily - 6 x weekly - 2 sets - 10 reps  ASSESSMENT:  CLINICAL IMPRESSION: Nakya is giving excellent effort with her home and clinic exercises.  Particular weakness is noted on inversion.  This contributes to her flat foot and toe-out gait.  We worked on overall foot and ankle strength, straightening out her foot with weight-bearing and gait and HEP independence.  Dao will continue to benefit from the recommended plan of care.  OBJECTIVE IMPAIRMENTS: decreased activity tolerance, difficulty walking, decreased balance, decreased endurance, decreased mobility, decreased ROM, decreased strength, impaired flexibility, LE use, postural dysfunction, and pain.  ACTIVITY LIMITATIONS: bending, lifting, carry, locomotion, cleaning, community activity, driving  PERSONAL FACTORS: PMH:Dep, OA,S/P TKR Lt 08/2022, DM, gout are also affecting patient's functional outcome.  REHAB POTENTIAL: Fair    CLINICAL DECISION MAKING: Evolving/moderate complexity  EVALUATION COMPLEXITY: Moderate    GOALS: Short term PT Goals Target date: 03/22/2023   Pt will be I and compliant with HEP. Baseline:  Goal status: Met 03/04/2023 Pt will decrease pain by 25% overall Baseline:7 Goal status: On Going 03/04/2023  Long term PT goals Target date:04/19/2023   Pt will improve Rt ankle AROM to WFL (10 deg DF and EV) to improve functional mobility Baseline: Goal status: New Pt will improve Rt ankle strength to at least 4+/5 in supine MMT to improve functional strength Baseline: Goal status: New Pt will improve FOTO to at least 57% functional  to show improved function Baseline: Goal status: New Pt will reduce pain by overall 50% (3.5 or less out  of 10) overall with usual activity Baseline:7 Goal status: New Pt will reduce pain to overall less than 2-3/10 with usual activity and work activity. Baseline: Goal status: New Pt will be able to ambulate community distances at least 750 ft with LRAD Baseline: Goal status: New 7. Patient will improve TUG time to less than 14 seconds to show improved gait speed and balance Goal status: new  PLAN: PT FREQUENCY: 1-2 times per week   PT DURATION: 6-8 weeks  PLANNED INTERVENTIONS (unless contraindicated): aquatic PT, Canalith repositioning, cryotherapy, Electrical stimulation, Iontophoresis with 4 mg/ml dexamethasome, Moist heat, traction, Ultrasound, gait training, Therapeutic exercise, balance training, neuromuscular re-education, patient/family education, prosthetic training, manual techniques, passive ROM, dry needling, taping, vasopnuematic device, vestibular, spinal manipulations, joint manipulations  PLAN FOR NEXT SESSION:  Ankle strength paticularly INV/EV, ankle ROM, balance and gait progressions as tolerated. Vaso if desire.      Cherlyn Cushing, PT, MPT 03/04/2023, 3:24 PM

## 2023-03-08 ENCOUNTER — Ambulatory Visit (INDEPENDENT_AMBULATORY_CARE_PROVIDER_SITE_OTHER): Payer: Medicare HMO | Admitting: Physical Therapy

## 2023-03-08 ENCOUNTER — Encounter: Payer: Self-pay | Admitting: Physical Therapy

## 2023-03-08 DIAGNOSIS — M6281 Muscle weakness (generalized): Secondary | ICD-10-CM

## 2023-03-08 DIAGNOSIS — R6889 Other general symptoms and signs: Secondary | ICD-10-CM | POA: Diagnosis not present

## 2023-03-08 DIAGNOSIS — R262 Difficulty in walking, not elsewhere classified: Secondary | ICD-10-CM | POA: Diagnosis not present

## 2023-03-08 DIAGNOSIS — R600 Localized edema: Secondary | ICD-10-CM

## 2023-03-08 DIAGNOSIS — M25671 Stiffness of right ankle, not elsewhere classified: Secondary | ICD-10-CM

## 2023-03-08 DIAGNOSIS — M25571 Pain in right ankle and joints of right foot: Secondary | ICD-10-CM | POA: Diagnosis not present

## 2023-03-08 NOTE — Therapy (Signed)
OUTPATIENT PHYSICAL THERAPY TREATMENT  Referring diagnosis? M19.071 Treatment diagnosis? (if different than referring diagnosis) M25.571 What was this (referring dx) caused by? []  Surgery []  Fall [x]  Ongoing issue [x]  Arthritis []  Other: ____________  Laterality: [x]  Rt []  Lt []  Both  Check all possible CPT codes:  *CHOOSE 10 OR LESS*    [x]  97110 (Therapeutic Exercise)  []  92507 (SLP Treatment)  [x]  16109 (Neuro Re-ed)   []  92526 (Swallowing Treatment)   [x]  97116 (Gait Training)   []  [id number] (Cognitive Training, 1st 15 minutes) [x]  97140 (Manual Therapy)   []  97130 (Cognitive Training, each add'l 15 minutes)  []  97164 (Re-evaluation)                              []  Other, List CPT Code ____________  [x]  97530 (Therapeutic Activities)     []  97535 (Self Care)   []  All codes above (97110 - 97535)  []  97012 (Mechanical Traction)  [x]  97014 (E-stim Unattended)  []  97032 (E-stim manual)  [x]  97033 (Ionto)  [x]  97035 (Ultrasound) []  97750 (Physical Performance Training) []  [id number] (Aquatic Therapy) []  97016 (Vasopneumatic Device) []  [id number] (Paraffin) []  97034 (Contrast Bath) []  97597 (Wound Care 1st 20 sq cm) []  97598 (Wound Care each add'l 20 sq cm) []  97760 (Orthotic Fabrication, Fitting, Training Initial) []  [id number] (Prosthetic Management and Training Initial) []  [id number] (Orthotic or Prosthetic Training/ Modification Subsequent)    Patient Name: LAVONE BARRIENTES MRN: [id number] DOB:12/01/1940, 82 y.o., female Today's Date: 03/08/2023  END OF SESSION:  PT End of Session - 03/08/23 1141     Visit Number 4    Number of Visits 12    Date for PT Re-Evaluation 04/19/23    Authorization Type [employer]    Authorization - Visit Number 4    Progress Note Due on Visit 10    PT Start Time 1141    PT Stop Time 1223    PT Time Calculation (min) 42 min    Activity Tolerance Patient tolerated treatment well;No increased pain    Behavior During Therapy WFL for tasks assessed/performed                Past Medical History:  Diagnosis Date   Chronic kidney disease    Complication of anesthesia    "hard time waking me up after foot surgery"   Depression    Diabetes (HCC)    type 2   Edema    Family history of kidney cancer    Family history of lung cancer    Family history of pancreatic cancer    Osteoarthritis of both knees    Sleep apnea    "years ago" does not wear CPAP anymmore   Past Surgical History:  Procedure Laterality Date   ABDOMINAL HYSTERECTOMY     FOOT SURGERY Left    TOTAL KNEE ARTHROPLASTY Left 08/19/2022   Procedure: LEFT TOTAL KNEE ARTHROPLASTY;  Surgeon: Cammy Copa, MD;  Location: [facility] OR;  Service: Orthopedics;  Laterality: Left;   TUBAL LIGATION     Patient Active Problem List   Diagnosis Date Noted   Current moderate episode of major depressive disorder without prior episode (HCC) 01/10/2023   BMI 33.0-33.9,adult 01/10/2023   Primary osteoarthritis of both knees 01/10/2023   Debility 10/05/2022   Gout attack 10/04/2022   UTI (urinary tract infection) 10/04/2022   Arthritis of left knee 08/21/2022   OA (osteoarthritis) of knee 08/19/2022   S/P  TKR (total knee replacement), left 08/19/2022   Controlled type 2 diabetes mellitus without complication, without long-term current use of insulin (HCC) 07/31/2021   Genetic testing 03/14/2020   Family history of pancreatic cancer    Family history of kidney cancer    Family history of lung cancer    Long term (current) use of insulin (HCC) 11/01/2018   Chronic kidney disease due to hypertension 08/24/2018   Non-thrombocytopenic purpura (HCC) 01/14/2016   Overactive bladder 01/14/2016   Obstructive sleep apnea (adult) (pediatric) 12/10/2009   Diabetic renal disease (HCC) 05/10/2009   Disorder of bone 05/10/2009   Gastro-esophageal reflux disease without esophagitis 05/10/2009    PCP: Gaspar Garbe, MD   REFERRING PROVIDER: Nadara Mustard, MD  REFERRING DIAG: [phone]  (ICD-10-CM) - Arthritis of right ankle [phone] (ICD-10-CM) - Insufficiency of right posterior tibial tendon  THERAPY DIAG:  Pain in right ankle and joints of right foot  Stiffness of right ankle, not elsewhere classified  Muscle weakness (generalized)  Difficulty in walking, not elsewhere classified  Localized edema  Rationale for Evaluation and Treatment: Rehabilitation  ONSET DATE: Rt ankle pain since December 2023  SUBJECTIVE:   SUBJECTIVE STATEMENT:She notes the pain is a little more overall today. She wears the ASO brace at home with her bedroom slippers but does not have any sneakers wide enough to accommodate room for the brace so outside the house she wears her sneakers and compression sleeve around her Rt ankle  PERTINENT HISTORY:PMH:Dep, OA,S/P TKR Lt 08/2022, DM, Gout  PAIN:  Are you having pain? Yes: NPRS scale: 6/10 for ankle and knee/10 Pain location: Rt medial ankle, some Rt knee pain as well Pain description: Swelling, tingles, shocking pain Aggravating factors: hurts at night when she lays down, prolonged standing Relieving factors: ankle pumps  PRECAUTIONS: None  WEIGHT BEARING RESTRICTIONS: No  FALLS:  Has patient fallen in last 6 months? 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 No acute abnormality seen.  PATIENT SURVEYS:  Eval: FOTO 48% functional intake, goal is 57%  COGNITION: Eval: Overall cognitive status: Within functional limits for tasks assessed       EDEMA:  Moderate edema in Rt ankle and foot present at eval  MUSCLE LENGTH: Eval: Tight heelcords Rt  POSTURE: Rt ankle valgus and R knee valgus noted at eval  PALPATION: Tender to palpation in medial Rt ankle, ankle hypomobility into  EV/INV  LOWER EXTREMITY ROM:  Active ROM/PROM Right eval   Hip flexion    Hip extension    Hip abduction    Hip adduction    Hip internal rotation    Hip external rotation    Knee flexion    Knee extension    Ankle dorsiflexion 5/   Ankle plantarflexion 38/   Ankle inversion 0/5   Ankle eversion 5/10    (Blank rows = not tested)  LOWER EXTREMITY MMT:  MMT in supine Right eval Left eval  Hip flexion    Hip extension    Hip abduction    Hip adduction    Hip internal rotation    Hip external rotation    Knee flexion    Knee extension    Ankle dorsiflexion 5   Ankle plantarflexion 4+   Ankle  inversion 3   Ankle eversion 4+    (Blank rows = not tested)  LOWER EXTREMITY SPECIAL TESTS:    FUNCTIONAL TESTS:  Eval: Timed up and go (TUG): 20.9 sec with RW  Tandem balance test 4 seconds with Rt leg back, 20 seconds with Lt leg back.  GAIT: Eval Comments: antalgic gait on Rt, uses RW for now but would like to wean off of this as PLOF was community ambulation without AD   TODAY'S TREATMENT:  03/08/23 Seated ankle circular rocker board X 20 AROM DF/PF, SUP/PRO, circles CW and CCW Seated gastroc stretch with strap 20 sec X 4 Supine with leg elevated Rt ankle alphabet  with 2#X 1 Supine with leg elevated Rt ankle circles with 2# X 15 CW and CCW Supine with leg elevated bilat ankle inversion isometrics 5 sec X 15 ball squeze Supine with leg elevated Rt ankle INV and EV with red 2 X 10 each plane Supine with leg elevated Rt ankle DF with green 2 X 10  Supine with leg elevated Rt ankle PF with blue 2 X 10 Tandem balance 20 sec X 3 bilat Heel and toe raises 2 X 10 with UE support Nu step L4 LE only, seat #10 X 6 min  03/04/2023 Thera-Band 4-way Red 2 sets of 10 in each direction (inversion and eversion particularly weak) Heel to toe raises 2 sets of 10 for 3 seconds Ankle Inversion Isometrics 10 x 5 seconds  Tandem balance 5 x 20 seconds   PATIENT  EDUCATION: Education details: HEP, PT plan of care Person educated: Patient Education method: Explanation, Demonstration, Verbal cues, and Handouts Education comprehension: verbalized understanding and needs further education   HOME EXERCISE PROGRAM: Access Code: [email or web address] URL: [email or web address] Date: 02/22/2023 Prepared by: Ivery Quale  Exercises - Seated Ankle Alphabet  - 2 x daily - 6 x weekly - 1 sets - 1-2 reps - Seated Ankle Circles  - 2 x daily - 6 x weekly - 1 sets - 15 reps - Isometric Ankle Inversion  - 2 x daily - 6 x weekly - 2 sets - 10 reps - 5 sec hold - Standing Tandem Balance with Counter Support  - 2 x daily - 6 x weekly - 1 sets - 3 reps - 30 hold - Side Stepping with Counter Support  - 2 x daily - 6 x weekly - 1 sets - 3-5 reps - Heel Toe Raises with Counter Support  - 2 x daily - 6 x weekly - 2 sets - 10 reps  ASSESSMENT:  CLINICAL IMPRESSION: We progressed her overall activity tolerance today in PT and she had good overall tolerance to this. She continues to have functional deficits and INV weakness.  Jamela will continue to benefit from the recommended plan of care.  OBJECTIVE IMPAIRMENTS: decreased activity tolerance, difficulty walking, decreased balance, decreased endurance, decreased mobility, decreased ROM, decreased strength, impaired flexibility, LE use, postural dysfunction, and pain.  ACTIVITY LIMITATIONS: bending, lifting, carry, locomotion, cleaning, community activity, driving  PERSONAL FACTORS: PMH:Dep, OA,S/P TKR Lt 08/2022, DM, gout are also affecting patient's functional outcome.  REHAB POTENTIAL: Fair    CLINICAL DECISION MAKING: Evolving/moderate complexity  EVALUATION COMPLEXITY: Moderate    GOALS: Short term PT Goals Target date: 03/22/2023   Pt will be I and compliant with HEP. Baseline:  Goal status: Met 03/04/2023 Pt will decrease pain by 25% overall Baseline:7 Goal status: On Going 03/04/2023  Long term PT  goals Target date:04/19/2023   Pt will improve  Rt ankle AROM to WFL (10 deg DF and EV) to improve functional mobility Baseline: Goal status: New Pt will improve Rt ankle strength to at least 4+/5 in supine MMT to improve functional strength Baseline: Goal status: New Pt will improve FOTO to at least 57% functional to show improved function Baseline: Goal status: New Pt will reduce pain by overall 50% (3.5 or less out of 10) overall with usual activity Baseline:7 Goal status: New Pt will reduce pain to overall less than 2-3/10 with usual activity and work activity. Baseline: Goal status: New Pt will be able to ambulate community distances at least 750 ft with LRAD Baseline: Goal status: New 7. Patient will improve TUG time to less than 14 seconds to show improved gait speed and balance Goal status: new  PLAN: PT FREQUENCY: 1-2 times per week   PT DURATION: 6-8 weeks  PLANNED INTERVENTIONS (unless contraindicated): aquatic PT, Canalith repositioning, cryotherapy, Electrical stimulation, Iontophoresis with 4 mg/ml dexamethasome, Moist heat, traction, Ultrasound, gait training, Therapeutic exercise, balance training, neuromuscular re-education, patient/family education, prosthetic training, manual techniques, passive ROM, dry needling, taping, vasopnuematic device, vestibular, spinal manipulations, joint manipulations  PLAN FOR NEXT SESSION:  Ankle strength paticularly INV/EV, ankle ROM, balance and gait progressions as tolerated    April Manson, PT, DPT 03/08/2023, 11:42 AM

## 2023-03-10 ENCOUNTER — Telehealth: Payer: Self-pay | Admitting: Orthopedic Surgery

## 2023-03-10 ENCOUNTER — Encounter: Payer: Self-pay | Admitting: Rehabilitative and Restorative Service Providers"

## 2023-03-10 ENCOUNTER — Other Ambulatory Visit: Payer: Self-pay | Admitting: Orthopedic Surgery

## 2023-03-10 ENCOUNTER — Ambulatory Visit (INDEPENDENT_AMBULATORY_CARE_PROVIDER_SITE_OTHER): Payer: Medicare HMO | Admitting: Rehabilitative and Restorative Service Providers"

## 2023-03-10 DIAGNOSIS — M6281 Muscle weakness (generalized): Secondary | ICD-10-CM

## 2023-03-10 DIAGNOSIS — M25671 Stiffness of right ankle, not elsewhere classified: Secondary | ICD-10-CM | POA: Diagnosis not present

## 2023-03-10 DIAGNOSIS — R262 Difficulty in walking, not elsewhere classified: Secondary | ICD-10-CM | POA: Diagnosis not present

## 2023-03-10 DIAGNOSIS — M25571 Pain in right ankle and joints of right foot: Secondary | ICD-10-CM | POA: Diagnosis not present

## 2023-03-10 DIAGNOSIS — R600 Localized edema: Secondary | ICD-10-CM

## 2023-03-10 DIAGNOSIS — R6889 Other general symptoms and signs: Secondary | ICD-10-CM | POA: Diagnosis not present

## 2023-03-10 MED ORDER — ALLOPURINOL 100 MG PO TABS: 100.0000 mg | ORAL_TABLET | Freq: Two times a day (BID) | ORAL | 3 refills | Status: DC

## 2023-03-10 NOTE — Therapy (Signed)
OUTPATIENT PHYSICAL THERAPY TREATMENT  Referring diagnosis? M19.071 Treatment diagnosis? (if different than referring diagnosis) M25.571 What was this (referring dx) caused by? []  Surgery []  Fall [x]  Ongoing issue [x]  Arthritis []  Other: ____________  Laterality: [x]  Rt []  Lt []  Both  Check all possible CPT codes:  *CHOOSE 10 OR LESS*    [x]  97110 (Therapeutic Exercise)  []  92507 (SLP Treatment)  [x]  97112 (Neuro Re-ed)   []  92526 (Swallowing Treatment)   [x]  97116 (Gait Training)   []  [id number] (Cognitive Training, 1st 15 minutes) [x]  97140 (Manual Therapy)   []  97130 (Cognitive Training, each add'l 15 minutes)  []  97164 (Re-evaluation)                              []  Other, List CPT Code ____________  [x]  97530 (Therapeutic Activities)     []  97535 (Self Care)   []  All codes above (97110 - 97535)  []  72536 (Mechanical Traction)  [x]  97014 (E-stim Unattended)  []  97032 (E-stim manual)  [x]  97033 (Ionto)  [x]  97035 (Ultrasound) []  97750 (Physical Performance Training) []  [id number] (Aquatic Therapy) []  97016 (Vasopneumatic Device) []  [id number] (Paraffin) []  97034 (Contrast Bath) []  97597 (Wound Care 1st 20 sq cm) []  97598 (Wound Care each add'l 20 sq cm) []  97760 (Orthotic Fabrication, Fitting, Training Initial) []  [id number] (Prosthetic Management and Training Initial) []  [id number] (Orthotic or Prosthetic Training/ Modification Subsequent)    Patient Name: SAKI LEGORE MRN: [id number] DOB:01/12/1941, 82 y.o., female Today's Date: 03/10/2023  END OF SESSION:  PT End of Session - 03/10/23 1145     Visit Number 5    Number of Visits 12    Date for PT Re-Evaluation 04/19/23    Authorization Type [employer]    Authorization - Visit Number 5    Progress Note Due on Visit 10    PT Start Time 1101    PT Stop Time 1141    PT Time Calculation (min) 40 min    Activity Tolerance Patient tolerated treatment well;No increased pain    Behavior During Therapy WFL for tasks assessed/performed               Past Medical History:  Diagnosis Date   Chronic kidney disease    Complication of anesthesia    "hard time waking me up after foot surgery"   Depression    Diabetes (HCC)    type 2   Edema    Family history of kidney cancer    Family history of lung cancer    Family history of pancreatic cancer    Osteoarthritis of both knees    Sleep apnea    "years ago" does not wear CPAP anymmore   Past Surgical History:  Procedure Laterality Date   ABDOMINAL HYSTERECTOMY     FOOT SURGERY Left    TOTAL KNEE ARTHROPLASTY Left 08/19/2022   Procedure: LEFT TOTAL KNEE ARTHROPLASTY;  Surgeon: Cammy Copa, MD;  Location: [facility] OR;  Service: Orthopedics;  Laterality: Left;   TUBAL LIGATION     Patient Active Problem List   Diagnosis Date Noted   Current moderate episode of major depressive disorder without prior episode (HCC) 01/10/2023   BMI 33.0-33.9,adult 01/10/2023   Primary osteoarthritis of both knees 01/10/2023   Debility 10/05/2022   Gout attack 10/04/2022   UTI (urinary tract infection) 10/04/2022   Arthritis of left knee 08/21/2022   OA (osteoarthritis) of knee 08/19/2022   S/P TKR (  total knee replacement), left 08/19/2022   Controlled type 2 diabetes mellitus without complication, without long-term current use of insulin (HCC) 07/31/2021   Genetic testing 03/14/2020   Family history of pancreatic cancer    Family history of kidney cancer    Family history of lung cancer    Long term (current) use of insulin (HCC) 11/01/2018   Chronic kidney disease due to hypertension 08/24/2018   Non-thrombocytopenic purpura (HCC) 01/14/2016   Overactive bladder 01/14/2016   Obstructive sleep apnea (adult) (pediatric) 12/10/2009   Diabetic renal disease (HCC) 05/10/2009   Disorder of bone 05/10/2009   Gastro-esophageal reflux disease without esophagitis 05/10/2009    PCP: Gaspar Garbe, MD   REFERRING PROVIDER: Nadara Mustard, MD  REFERRING DIAG: [phone]  (ICD-10-CM) - Arthritis of right ankle [phone] (ICD-10-CM) - Insufficiency of right posterior tibial tendon  THERAPY DIAG:  Pain in right ankle and joints of right foot  Stiffness of right ankle, not elsewhere classified  Muscle weakness (generalized)  Difficulty in walking, not elsewhere classified  Localized edema  Rationale for Evaluation and Treatment: Rehabilitation  ONSET DATE: Rt ankle pain since December 2023  SUBJECTIVE:   SUBJECTIVE STATEMENT: Melissia notes she has been doing the exercises at home.  We discussed focusing on activities that strengthen the tibialis posterior, calf muscles and improve balance.  PERTINENT HISTORY:PMH:Dep, OA,S/P TKR Lt 08/2022, DM, Gout  PAIN:  Are you having pain? Yes: NPRS scale: 3-6/10 for ankle and knee/10 Pain location: Rt medial ankle, some Rt knee pain as well Pain description: Swelling, tingles, shocking pain Aggravating factors: hurts at night when she lays down, prolonged standing Relieving factors: ankle pumps  PRECAUTIONS: None  WEIGHT BEARING RESTRICTIONS: No  FALLS:  Has patient fallen in last 6 months? 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 No acute abnormality seen.  PATIENT SURVEYS:  Eval: FOTO 48% functional intake, goal is 57%  COGNITION: Eval: Overall cognitive status: Within functional limits for tasks assessed       EDEMA:  Moderate edema in Rt ankle and foot present at eval  MUSCLE LENGTH: Eval: Tight heelcords Rt  POSTURE: Rt ankle valgus and R knee valgus noted at eval  PALPATION: Tender to palpation in medial Rt ankle, ankle hypomobility into EV/INV  LOWER EXTREMITY ROM:  Active ROM/PROM Right eval   Hip flexion    Hip extension    Hip abduction     Hip adduction    Hip internal rotation    Hip external rotation    Knee flexion    Knee extension    Ankle dorsiflexion 5/   Ankle plantarflexion 38/   Ankle inversion 0/5   Ankle eversion 5/10    (Blank rows = not tested)  LOWER EXTREMITY MMT:  MMT in supine Right eval Left eval  Hip flexion    Hip extension    Hip abduction    Hip adduction    Hip internal rotation    Hip external rotation    Knee flexion    Knee extension    Ankle dorsiflexion 5   Ankle plantarflexion 4+   Ankle inversion 3   Ankle eversion 4+    (Blank rows = not tested)  LOWER EXTREMITY SPECIAL TESTS:    FUNCTIONAL  TESTS:  Eval: Timed up and go (TUG): 20.9 sec with RW  Tandem balance test 4 seconds with Rt leg back, 20 seconds with Lt leg back.  GAIT: Eval Comments: antalgic gait on Rt, uses RW for now but would like to wean off of this as PLOF was community ambulation without AD   TODAY'S TREATMENT:  03/10/2023 Thera-Band Inversion/Eversion Red 2 sets of 10 in each direction (inversion particularly weak) Heel to toe raises 2 sets of 10 for 3 seconds Ankle Inversion Isometrics 2 sets of 10 x 5 seconds  Tandem balance 5 x 20 seconds (needs help with foot placement, about 1-2 inches width between feet)   03/08/23 Seated ankle circular rocker board X 20 AROM DF/PF, SUP/PRO, circles CW and CCW Seated gastroc stretch with strap 20 sec X 4 Supine with leg elevated Rt ankle alphabet  with 2#X 1 Supine with leg elevated Rt ankle circles with 2# X 15 CW and CCW Supine with leg elevated bilat ankle inversion isometrics 5 sec X 15 ball squeze Supine with leg elevated Rt ankle INV and EV with red 2 X 10 each plane Supine with leg elevated Rt ankle DF with green 2 X 10  Supine with leg elevated Rt ankle PF with blue 2 X 10 Tandem balance 20 sec X 3 bilat Heel and toe raises 2 X 10 with UE support Nu step L4 LE only, seat #10 X 6 min   03/04/2023 Thera-Band 4-way Red 2 sets of 10 in each  direction (inversion and eversion particularly weak) Heel to toe raises 2 sets of 10 for 3 seconds Ankle Inversion Isometrics 10 x 5 seconds  Tandem balance 5 x 20 seconds   PATIENT EDUCATION: Education details: HEP, PT plan of care Person educated: Patient Education method: Explanation, Demonstration, Verbal cues, and Handouts Education comprehension: verbalized understanding and needs further education   HOME EXERCISE PROGRAM: Access Code: [email or web address] URL: [email or web address] Date: 02/22/2023 Prepared by: Ivery Quale  Exercises - Seated Ankle Alphabet  - 2 x daily - 6 x weekly - 1 sets - 1-2 reps - Seated Ankle Circles  - 2 x daily - 6 x weekly - 1 sets - 15 reps - Isometric Ankle Inversion  - 2 x daily - 6 x weekly - 2 sets - 10 reps - 5 sec hold - Standing Tandem Balance with Counter Support  - 2 x daily - 6 x weekly - 1 sets - 3 reps - 30 hold - Side Stepping with Counter Support  - 2 x daily - 6 x weekly - 1 sets - 3-5 reps - Heel Toe Raises with Counter Support  - 2 x daily - 6 x weekly - 2 sets - 10 reps  ASSESSMENT:  CLINICAL IMPRESSION: 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  We also discussed the importance of trying to step straight with her toes forward vs stepping in a 30-45 degree toe-out posture to decrease strain on the medial ankle.  I reinforced 4 of her current exercises for her HEP (Thera-band inversion, inversion isometrics, heel to toe raises and heel to toe balance).  Continue balance, strength and gait work to meet long-term goals.  OBJECTIVE IMPAIRMENTS: decreased activity tolerance, difficulty walking, decreased balance, decreased endurance, decreased mobility, decreased ROM, decreased strength, impaired flexibility, LE use, postural dysfunction, and pain.  ACTIVITY LIMITATIONS: bending, lifting, carry, locomotion, cleaning, community activity, driving  PERSONAL  FACTORS: PMH:Dep, OA,S/P TKR Lt 08/2022,  DM, gout are also affecting patient's functional outcome.  REHAB POTENTIAL: Fair    CLINICAL DECISION MAKING: Evolving/moderate complexity  EVALUATION COMPLEXITY: Moderate    GOALS: Short term PT Goals Target date: 03/22/2023   Pt will be I and compliant with HEP. Baseline:  Goal status: Met 03/04/2023 Pt will decrease pain by 25% overall Baseline:7 Goal status: On Going 03/10/2023  Long term PT goals Target date:04/19/2023   Pt will improve Rt ankle AROM to WFL (10 deg DF and EV) to improve functional mobility Baseline: Goal status: New Pt will improve Rt ankle strength to at least 4+/5 in supine MMT to improve functional strength Baseline: Goal status: New Pt will improve FOTO to at least 57% functional to show improved function Baseline: Goal status: New Pt will reduce pain by overall 50% (3.5 or less out of 10) overall with usual activity Baseline:7 Goal status: New Pt will reduce pain to overall less than 2-3/10 with usual activity and work activity. Baseline: Goal status: New Pt will be able to ambulate community distances at least 750 ft with LRAD Baseline: Goal status: New 7. Patient will improve TUG time to less than 14 seconds to show improved gait speed and balance Goal status: new  PLAN: PT FREQUENCY: 1-2 times per week   PT DURATION: 6-8 weeks  PLANNED INTERVENTIONS (unless contraindicated): aquatic PT, Canalith repositioning, cryotherapy, Electrical stimulation, Iontophoresis with 4 mg/ml dexamethasome, Moist heat, traction, Ultrasound, gait training, Therapeutic exercise, balance training, neuromuscular re-education, patient/family education, prosthetic training, manual techniques, passive ROM, dry needling, taping, vasopnuematic device, vestibular, spinal manipulations, joint manipulations  PLAN FOR NEXT SESSION:  Ankle strength paticularly INV, ankle ROM, balance and gait progressions as appropriate.  Get an  objective measure to update goals.  How did the 4 recommended exercises at home go (see assessment)?    Cherlyn Cushing, PT, MPT 03/10/2023, 11:51 AM

## 2023-03-10 NOTE — Telephone Encounter (Signed)
Allopurinol sent to pharmacy.

## 2023-03-10 NOTE — Telephone Encounter (Signed)
Pt's insurance company called requesting a refill of allopurinol. Pt last visit 5/24. Please send to pharmacy on file. Pt phone number is [phone].

## 2023-03-14 ENCOUNTER — Encounter: Payer: Self-pay | Admitting: Physical Therapy

## 2023-03-14 ENCOUNTER — Ambulatory Visit (INDEPENDENT_AMBULATORY_CARE_PROVIDER_SITE_OTHER): Payer: Medicare HMO | Admitting: Physical Therapy

## 2023-03-14 DIAGNOSIS — M25571 Pain in right ankle and joints of right foot: Secondary | ICD-10-CM

## 2023-03-14 DIAGNOSIS — M6281 Muscle weakness (generalized): Secondary | ICD-10-CM | POA: Diagnosis not present

## 2023-03-14 DIAGNOSIS — M25671 Stiffness of right ankle, not elsewhere classified: Secondary | ICD-10-CM

## 2023-03-14 DIAGNOSIS — R600 Localized edema: Secondary | ICD-10-CM | POA: Diagnosis not present

## 2023-03-14 DIAGNOSIS — R262 Difficulty in walking, not elsewhere classified: Secondary | ICD-10-CM

## 2023-03-14 DIAGNOSIS — R6889 Other general symptoms and signs: Secondary | ICD-10-CM | POA: Diagnosis not present

## 2023-03-14 NOTE — Therapy (Signed)
OUTPATIENT PHYSICAL THERAPY TREATMENT  Referring diagnosis? M19.071 Treatment diagnosis? (if different than referring diagnosis) M25.571 What was this (referring dx) caused by? []  Surgery []  Fall [x]  Ongoing issue [x]  Arthritis []  Other: ____________  Laterality: [x]  Rt []  Lt []  Both  Check all possible CPT codes:  *CHOOSE 10 OR LESS*    [x]  97110 (Therapeutic Exercise)  []  92507 (SLP Treatment)  [x]  97112 (Neuro Re-ed)   []  92526 (Swallowing Treatment)   [x]  78295 (Gait Training)   []  [id number] (Cognitive Training, 1st 15 minutes) [x]  97140 (Manual Therapy)   []  97130 (Cognitive Training, each add'l 15 minutes)  []  97164 (Re-evaluation)                              []  Other, List CPT Code ____________  [x]  97530 (Therapeutic Activities)     []  97535 (Self Care)   []  All codes above (97110 - 97535)  []  97012 (Mechanical Traction)  [x]  97014 (E-stim Unattended)  []  62130 (E-stim manual)  [x]  97033 (Ionto)  [x]  97035 (Ultrasound) []  97750 (Physical Performance Training) []  [id number] (Aquatic Therapy) []  97016 (Vasopneumatic Device) []  [id number] (Paraffin) []  97034 (Contrast Bath) []  97597 (Wound Care 1st 20 sq cm) []  97598 (Wound Care each add'l 20 sq cm) []  97760 (Orthotic Fabrication, Fitting, Training Initial) []  [id number] (Prosthetic Management and Training Initial) []  [id number] (Orthotic or Prosthetic Training/ Modification Subsequent)    Patient Name: Mckenzie Rogers MRN: [id number] DOB:1940/10/14, 82 y.o., female Today's Date: 03/14/2023  END OF SESSION:  PT End of Session - 03/14/23 1150     Visit Number 6    Number of Visits 12    Date for PT Re-Evaluation 04/19/23    Authorization Type [employer]    Progress Note Due on Visit 10    PT Start Time 1140    PT Stop Time 1220    PT Time Calculation (min) 40 min    Activity Tolerance Patient tolerated treatment well;No increased pain    Behavior During Therapy WFL for tasks assessed/performed              Past Medical  History:  Diagnosis Date   Chronic kidney disease    Complication of anesthesia    "hard time waking me up after foot surgery"   Depression    Diabetes (HCC)    type 2   Edema    Family history of kidney cancer    Family history of lung cancer    Family history of pancreatic cancer    Osteoarthritis of both knees    Sleep apnea    "years ago" does not wear CPAP anymmore   Past Surgical History:  Procedure Laterality Date   ABDOMINAL HYSTERECTOMY     FOOT SURGERY Left    TOTAL KNEE ARTHROPLASTY Left 08/19/2022   Procedure: LEFT TOTAL KNEE ARTHROPLASTY;  Surgeon: Cammy Copa, MD;  Location: [facility] OR;  Service: Orthopedics;  Laterality: Left;   TUBAL LIGATION     Patient Active Problem List   Diagnosis Date Noted   Current moderate episode of major depressive disorder without prior episode (HCC) 01/10/2023   BMI 33.0-33.9,adult 01/10/2023   Primary osteoarthritis of both knees 01/10/2023   Debility 10/05/2022   Gout attack 10/04/2022   UTI (urinary tract infection) 10/04/2022   Arthritis of left knee 08/21/2022   OA (osteoarthritis) of knee 08/19/2022   S/P TKR (total knee replacement), left 08/19/2022   Controlled  type 2 diabetes mellitus without complication, without long-term current use of insulin (HCC) 07/31/2021   Genetic testing 03/14/2020   Family history of pancreatic cancer    Family history of kidney cancer    Family history of lung cancer    Long term (current) use of insulin (HCC) 11/01/2018   Chronic kidney disease due to hypertension 08/24/2018   Non-thrombocytopenic purpura (HCC) 01/14/2016   Overactive bladder 01/14/2016   Obstructive sleep apnea (adult) (pediatric) 12/10/2009   Diabetic renal disease (HCC) 05/10/2009   Disorder of bone 05/10/2009   Gastro-esophageal reflux disease without esophagitis 05/10/2009    PCP: Gaspar Garbe, MD   REFERRING PROVIDER: Nadara Mustard, MD  REFERRING DIAG: [phone] (ICD-10-CM) - Arthritis of right  ankle [phone] (ICD-10-CM) - Insufficiency of right posterior tibial tendon  THERAPY DIAG:  Pain in right ankle and joints of right foot  Stiffness of right ankle, not elsewhere classified  Muscle weakness (generalized)  Difficulty in walking, not elsewhere classified  Localized edema  Rationale for Evaluation and Treatment: Rehabilitation  ONSET DATE: Rt ankle pain since December 2023  SUBJECTIVE:   SUBJECTIVE STATEMENT: She notes about 5/10 ankle pain today. She again arrives in compression sleeve vs ASO brace as she says she can't get her shoes on if she has the ASO brace on.   PERTINENT HISTORY:PMH:Dep, OA,S/P TKR Lt 08/2022, DM, Gout  PAIN:  Are you having pain? Yes: NPRS scale: 3-6/10 for ankle and knee/10 Pain location: Rt medial ankle, some Rt knee pain as well Pain description: Swelling, tingles, shocking pain Aggravating factors: hurts at night when she lays down, prolonged standing Relieving factors: ankle pumps  PRECAUTIONS: None  WEIGHT BEARING RESTRICTIONS: No  FALLS:  Has patient fallen in last 6 months? 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 No acute abnormality seen.  PATIENT SURVEYS:  Eval: FOTO 48% functional intake, goal is 57%  COGNITION: Eval: Overall cognitive status: Within functional limits for tasks assessed       EDEMA:  Moderate edema in Rt ankle and foot present at eval  MUSCLE LENGTH: Eval: Tight heelcords Rt  POSTURE: Rt ankle valgus and R knee valgus noted at eval  PALPATION: Tender to palpation in medial Rt ankle, ankle hypomobility into EV/INV  LOWER EXTREMITY ROM:  Active ROM/PROM Right eval Right 03/14/23  Hip flexion    Hip extension    Hip abduction    Hip adduction    Hip  internal rotation    Hip external rotation    Knee flexion    Knee extension    Ankle dorsiflexion 5/ 7/  Ankle plantarflexion 38/   Ankle inversion 0/5 5/  Ankle eversion 5/10 20/   (Blank rows = not tested)  LOWER EXTREMITY MMT:  MMT in supine Right eval Left eval  Hip flexion    Hip extension    Hip abduction    Hip adduction    Hip internal rotation    Hip external rotation    Knee flexion    Knee extension    Ankle dorsiflexion 5   Ankle plantarflexion 4+   Ankle inversion 3   Ankle eversion 4+    (Blank rows = not tested)  LOWER EXTREMITY SPECIAL TESTS:    FUNCTIONAL  TESTS:  Eval: Timed up and go (TUG): 20.9 sec with RW  Tandem balance test 4 seconds with Rt leg back, 20 seconds with Lt leg back.  GAIT: Eval Comments: antalgic gait on Rt, uses RW for now but would like to wean off of this as PLOF was community ambulation without AD   TODAY'S TREATMENT:  03/14/23 Seated ankle circular rocker board X 20 AROM DF/PF, SUP/PRO, circles X10 CW and CCW Supine with leg elevated Rt ankle alphabet  with 2.5 #X 1 Supine with leg elevated Rt ankle circles with 2.5# X 15 CW and CCW Supine with leg elevated bilat ankle inversion isometrics 5 sec 2X10 ball squeze Supine with leg elevated Rt ankle INV and EV with red 2 X 10 each plane (more weakness noted with INV) Supine with leg elevated Rt ankle DF with red 2 X 10  Tandem balance 20 sec X 3 bilat Heel and toe raises 2 X 10 with UE support Nu step L5 LE only, seat #11 X 6 min  03/10/2023 Thera-Band Inversion/Eversion Red 2 sets of 10 in each direction (inversion particularly weak) Heel to toe raises 2 sets of 10 for 3 seconds Ankle Inversion Isometrics 2 sets of 10 x 5 seconds  Tandem balance 5 x 20 seconds (needs help with foot placement, about 1-2 inches width between feet)   03/08/23 Seated ankle circular rocker board X 20 AROM DF/PF, SUP/PRO, circles CW and CCW Seated gastroc stretch with strap 20 sec X 4 Supine  with leg elevated Rt ankle alphabet  with 2#X 1 Supine with leg elevated Rt ankle circles with 2# X 15 CW and CCW Supine with leg elevated bilat ankle inversion isometrics 5 sec X 15 ball squeze Supine with leg elevated Rt ankle INV and EV with red 2 X 10 each plane Supine with leg elevated Rt ankle DF with green 2 X 10  Supine with leg elevated Rt ankle PF with blue 2 X 10 Tandem balance 20 sec X 3 bilat Heel and toe raises 2 X 10 with UE support Nu step L4 LE only, seat #10 X 6 min    PATIENT EDUCATION: Education details: HEP, PT plan of care Person educated: Patient Education method: Explanation, Demonstration, Verbal cues, and Handouts Education comprehension: verbalized understanding and needs further education   HOME EXERCISE PROGRAM: Access Code: [email or web address] URL: [email or web address] Date: 02/22/2023 Prepared by: Ivery Quale  Exercises - Seated Ankle Alphabet  - 2 x daily - 6 x weekly - 1 sets - 1-2 reps - Seated Ankle Circles  - 2 x daily - 6 x weekly - 1 sets - 15 reps - Isometric Ankle Inversion  - 2 x daily - 6 x weekly - 2 sets - 10 reps - 5 sec hold - Standing Tandem Balance with Counter Support  - 2 x daily - 6 x weekly - 1 sets - 3 reps - 30 hold - Side Stepping with Counter Support  - 2 x daily - 6 x weekly - 1 sets - 3-5 reps - Heel Toe Raises with Counter Support  - 2 x daily - 6 x weekly - 2 sets - 10 reps  ASSESSMENT:  CLINICAL IMPRESSION: She showed improved overall ankle AROM measurements and she has done a good job with her HEP. I progressed her Rt ankle strengthening program today a little with good tolerance. She will continue to benefit from balance, strength and gait work to meet long-term goals.  OBJECTIVE IMPAIRMENTS: decreased activity tolerance, difficulty walking,  decreased balance, decreased endurance, decreased mobility, decreased ROM, decreased strength, impaired flexibility, LE use, postural dysfunction, and pain.  ACTIVITY  LIMITATIONS: bending, lifting, carry, locomotion, cleaning, community activity, driving  PERSONAL FACTORS: PMH:Dep, OA,S/P TKR Lt 08/2022, DM, gout are also affecting patient's functional outcome.  REHAB POTENTIAL: Fair    CLINICAL DECISION MAKING: Evolving/moderate complexity  EVALUATION COMPLEXITY: Moderate    GOALS: Short term PT Goals Target date: 03/22/2023   Pt will be I and compliant with HEP. Baseline:  Goal status: Met 03/04/2023 Pt will decrease pain by 25% overall Baseline:7 Goal status: On Going 03/10/2023  Long term PT goals Target date:04/19/2023   Pt will improve Rt ankle AROM to WFL (10 deg DF and EV) to improve functional mobility Baseline: Goal status: New Pt will improve Rt ankle strength to at least 4+/5 in supine MMT to improve functional strength Baseline: Goal status: New Pt will improve FOTO to at least 57% functional to show improved function Baseline: Goal status: New Pt will reduce pain by overall 50% (3.5 or less out of 10) overall with usual activity Baseline:7 Goal status: New Pt will reduce pain to overall less than 2-3/10 with usual activity and work activity. Baseline: Goal status: New Pt will be able to ambulate community distances at least 750 ft with LRAD Baseline: Goal status: New 7. Patient will improve TUG time to less than 14 seconds to show improved gait speed and balance Goal status: new  PLAN: PT FREQUENCY: 1-2 times per week   PT DURATION: 6-8 weeks  PLANNED INTERVENTIONS (unless contraindicated): aquatic PT, Canalith repositioning, cryotherapy, Electrical stimulation, Iontophoresis with 4 mg/ml dexamethasome, Moist heat, traction, Ultrasound, gait training, Therapeutic exercise, balance training, neuromuscular re-education, patient/family education, prosthetic training, manual techniques, passive ROM, dry needling, taping, vasopnuematic device, vestibular, spinal manipulations, joint manipulations  PLAN FOR NEXT SESSION:   Ankle strength paticularly INV, ankle ROM, balance and gait progressions as appropriate.  Get an objective measure to update goals.  How did the 4 recommended exercises at home go (see assessment)?    April Manson, PT, DPT 03/14/2023, 11:51 AM

## 2023-03-16 ENCOUNTER — Ambulatory Visit (INDEPENDENT_AMBULATORY_CARE_PROVIDER_SITE_OTHER): Payer: Medicare HMO | Admitting: Physical Therapy

## 2023-03-16 ENCOUNTER — Encounter: Payer: Self-pay | Admitting: Physical Therapy

## 2023-03-16 DIAGNOSIS — M6281 Muscle weakness (generalized): Secondary | ICD-10-CM | POA: Diagnosis not present

## 2023-03-16 DIAGNOSIS — M25571 Pain in right ankle and joints of right foot: Secondary | ICD-10-CM

## 2023-03-16 DIAGNOSIS — M25671 Stiffness of right ankle, not elsewhere classified: Secondary | ICD-10-CM

## 2023-03-16 DIAGNOSIS — R262 Difficulty in walking, not elsewhere classified: Secondary | ICD-10-CM

## 2023-03-16 DIAGNOSIS — R600 Localized edema: Secondary | ICD-10-CM | POA: Diagnosis not present

## 2023-03-16 DIAGNOSIS — R6889 Other general symptoms and signs: Secondary | ICD-10-CM | POA: Diagnosis not present

## 2023-03-16 NOTE — Therapy (Signed)
OUTPATIENT PHYSICAL THERAPY TREATMENT  Referring diagnosis? M19.071 Treatment diagnosis? (if different than referring diagnosis) M25.571 What was this (referring dx) caused by? []  Surgery []  Fall [x]  Ongoing issue [x]  Arthritis []  Other: ____________  Laterality: [x]  Rt []  Lt []  Both  Check all possible CPT codes:  *CHOOSE 10 OR LESS*    [x]  97110 (Therapeutic Exercise)  []  92507 (SLP Treatment)  [x]  97112 (Neuro Re-ed)   []  92526 (Swallowing Treatment)   [x]  97116 (Gait Training)   []  [id number] (Cognitive Training, 1st 15 minutes) [x]  97140 (Manual Therapy)   []  97130 (Cognitive Training, each add'l 15 minutes)  []  97164 (Re-evaluation)                              []  Other, List CPT Code ____________  [x]  97530 (Therapeutic Activities)     []  97535 (Self Care)   []  All codes above (97110 - 97535)  []  97012 (Mechanical Traction)  [x]  97014 (E-stim Unattended)  []  16109 (E-stim manual)  [x]  97033 (Ionto)  [x]  97035 (Ultrasound) []  97750 (Physical Performance Training) []  [id number] (Aquatic Therapy) []  97016 (Vasopneumatic Device) []  [id number] (Paraffin) []  97034 (Contrast Bath) []  97597 (Wound Care 1st 20 sq cm) []  97598 (Wound Care each add'l 20 sq cm) []  97760 (Orthotic Fabrication, Fitting, Training Initial) []  [id number] (Prosthetic Management and Training Initial) []  [id number] (Orthotic or Prosthetic Training/ Modification Subsequent)    Patient Name: Mckenzie Rogers MRN: [id number] DOB:1940/10/09, 82 y.o., female Today's Date: 03/16/2023  END OF SESSION:  PT End of Session - 03/16/23 1145     Visit Number 7    Number of Visits 12    Date for PT Re-Evaluation 04/19/23    Authorization Type [employer]    Progress Note Due on Visit 10    PT Start Time 1145    PT Stop Time 1223    PT Time Calculation (min) 38 min    Activity Tolerance Patient tolerated treatment well;No increased pain    Behavior During Therapy WFL for tasks assessed/performed              Past Medical  History:  Diagnosis Date   Chronic kidney disease    Complication of anesthesia    "hard time waking me up after foot surgery"   Depression    Diabetes (HCC)    type 2   Edema    Family history of kidney cancer    Family history of lung cancer    Family history of pancreatic cancer    Osteoarthritis of both knees    Sleep apnea    "years ago" does not wear CPAP anymmore   Past Surgical History:  Procedure Laterality Date   ABDOMINAL HYSTERECTOMY     FOOT SURGERY Left    TOTAL KNEE ARTHROPLASTY Left 08/19/2022   Procedure: LEFT TOTAL KNEE ARTHROPLASTY;  Surgeon: Cammy Copa, MD;  Location: [facility] OR;  Service: Orthopedics;  Laterality: Left;   TUBAL LIGATION     Patient Active Problem List   Diagnosis Date Noted   Current moderate episode of major depressive disorder without prior episode (HCC) 01/10/2023   BMI 33.0-33.9,adult 01/10/2023   Primary osteoarthritis of both knees 01/10/2023   Debility 10/05/2022   Gout attack 10/04/2022   UTI (urinary tract infection) 10/04/2022   Arthritis of left knee 08/21/2022   OA (osteoarthritis) of knee 08/19/2022   S/P TKR (total knee replacement), left 08/19/2022   Controlled  type 2 diabetes mellitus without complication, without long-term current use of insulin (HCC) 07/31/2021   Genetic testing 03/14/2020   Family history of pancreatic cancer    Family history of kidney cancer    Family history of lung cancer    Long term (current) use of insulin (HCC) 11/01/2018   Chronic kidney disease due to hypertension 08/24/2018   Non-thrombocytopenic purpura (HCC) 01/14/2016   Overactive bladder 01/14/2016   Obstructive sleep apnea (adult) (pediatric) 12/10/2009   Diabetic renal disease (HCC) 05/10/2009   Disorder of bone 05/10/2009   Gastro-esophageal reflux disease without esophagitis 05/10/2009    PCP: Gaspar Garbe, MD   REFERRING PROVIDER: Nadara Mustard, MD  REFERRING DIAG: [phone] (ICD-10-CM) - Arthritis of right  ankle [phone] (ICD-10-CM) - Insufficiency of right posterior tibial tendon  THERAPY DIAG:  Pain in right ankle and joints of right foot  Stiffness of right ankle, not elsewhere classified  Muscle weakness (generalized)  Difficulty in walking, not elsewhere classified  Localized edema  Rationale for Evaluation and Treatment: Rehabilitation  ONSET DATE: Rt ankle pain since December 2023  SUBJECTIVE:   SUBJECTIVE STATEMENT: She notes about 7/10 ankle pain upon  PERTINENT HISTORY:PMH:Dep, OA,S/P TKR Lt 08/2022, DM, Gout  PAIN:  Are you having pain? Yes: NPRS scale: 710 for ankle and knee/10 Pain location: Rt medial ankle, some Rt knee pain as well Pain description: Swelling, tingles, shocking pain Aggravating factors: hurts at night when she lays down, prolonged standing Relieving factors: ankle pumps  PRECAUTIONS: None  WEIGHT BEARING RESTRICTIONS: No  FALLS:  Has patient fallen in last 6 months? 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 No acute abnormality seen.  PATIENT SURVEYS:  Eval: FOTO 48% functional intake, goal is 57%  COGNITION: Eval: Overall cognitive status: Within functional limits for tasks assessed       EDEMA:  Moderate edema in Rt ankle and foot present at eval  MUSCLE LENGTH: Eval: Tight heelcords Rt  POSTURE: Rt ankle valgus and R knee valgus noted at eval  PALPATION: Tender to palpation in medial Rt ankle, ankle hypomobility into EV/INV  LOWER EXTREMITY ROM:  Active ROM/PROM Right eval Right 03/14/23  Hip flexion    Hip extension    Hip abduction    Hip adduction    Hip internal rotation    Hip external rotation    Knee flexion    Knee extension    Ankle dorsiflexion 5/ 7/  Ankle  plantarflexion 38/   Ankle inversion 0/5 5/  Ankle eversion 5/10 20/   (Blank rows = not tested)  LOWER EXTREMITY MMT:  MMT in supine Right eval Left eval  Hip flexion    Hip extension    Hip abduction    Hip adduction    Hip internal rotation    Hip external rotation    Knee flexion    Knee extension    Ankle dorsiflexion 5   Ankle plantarflexion 4+   Ankle inversion 3   Ankle eversion 4+    (Blank rows = not tested)  LOWER EXTREMITY SPECIAL TESTS:    FUNCTIONAL TESTS:  Eval: Timed up and go (TUG): 20.9 sec with RW  Tandem balance test 4 seconds with Rt leg back, 20 seconds with Lt  leg back.  GAIT: Eval Comments: antalgic gait on Rt, uses RW for now but would like to wean off of this as PLOF was community ambulation without AD   TODAY'S TREATMENT:  03/16/23 Nu step L5 LE only, seat #11 X 6 min Tandem balance 20 sec X 3 bilat Heel and toe raises 2 X 10 with UE support Seated ankle circular rocker board X 20 AROM DF/PF, SUP/PRO, circles X10 CW and CCW Seated bilat ankle inversion isometrics 5 sec 2X10 ball squeze Standing gastroc stretch on slantboard 30 sec X 3 Standing marches on foam pad with one UE support X 10 reps each bilat Seated Rt ankle INV,DF,EV, PF with red 2 X 10 each plane (more weakness noted with INV, doubled up band for PF)  03/14/23 Seated ankle circular rocker board X 20 AROM DF/PF, SUP/PRO, circles X10 CW and CCW Supine with leg elevated Rt ankle alphabet  with 2.5 #X 1 Supine with leg elevated Rt ankle circles with 2.5# X 15 CW and CCW Supine with leg elevated bilat ankle inversion isometrics 5 sec 2X10 ball squeze Supine with leg elevated Rt ankle INV and EV with red 2 X 10 each plane (more weakness noted with INV) Supine with leg elevated Rt ankle DF with red 2 X 10  Tandem balance 20 sec X 3 bilat Heel and toe raises 2 X 10 with UE support Nu step L5 LE only, seat #11 X 6 min   PATIENT EDUCATION: Education details: HEP, PT plan of  care Person educated: Patient Education method: Explanation, Demonstration, Verbal cues, and Handouts Education comprehension: verbalized understanding and needs further education   HOME EXERCISE PROGRAM: Access Code: [email or web address] URL: [email or web address] Date: 02/22/2023 Prepared by: Ivery Quale  Exercises - Seated Ankle Alphabet  - 2 x daily - 6 x weekly - 1 sets - 1-2 reps - Seated Ankle Circles  - 2 x daily - 6 x weekly - 1 sets - 15 reps - Isometric Ankle Inversion  - 2 x daily - 6 x weekly - 2 sets - 10 reps - 5 sec hold - Standing Tandem Balance with Counter Support  - 2 x daily - 6 x weekly - 1 sets - 3 reps - 30 hold - Side Stepping with Counter Support  - 2 x daily - 6 x weekly - 1 sets - 3-5 reps - Heel Toe Raises with Counter Support  - 2 x daily - 6 x weekly - 2 sets - 10 reps  ASSESSMENT:  CLINICAL IMPRESSION: She was able to progress so more standing activity but she does get fatigued easily and needs sitting rest breaks.  She will continue to benefit from balance, ankle INV strength focus and gait to meet long-term goals.  OBJECTIVE IMPAIRMENTS: decreased activity tolerance, difficulty walking, decreased balance, decreased endurance, decreased mobility, decreased ROM, decreased strength, impaired flexibility, LE use, postural dysfunction, and pain.  ACTIVITY LIMITATIONS: bending, lifting, carry, locomotion, cleaning, community activity, driving  PERSONAL FACTORS: PMH:Dep, OA,S/P TKR Lt 08/2022, DM, gout are also affecting patient's functional outcome.  REHAB POTENTIAL: Fair    CLINICAL DECISION MAKING: Evolving/moderate complexity  EVALUATION COMPLEXITY: Moderate    GOALS: Short term PT Goals Target date: 03/22/2023   Pt will be I and compliant with HEP. Baseline:  Goal status: Met 03/04/2023 Pt will decrease pain by 25% overall Baseline:7 Goal status: On Going 03/10/2023  Long term PT goals Target date:04/19/2023   Pt will improve Rt ankle  AROM to WFL (10 deg DF  and EV) to improve functional mobility Baseline: Goal status: New Pt will improve Rt ankle strength to at least 4+/5 in supine MMT to improve functional strength Baseline: Goal status: New Pt will improve FOTO to at least 57% functional to show improved function Baseline: Goal status: New Pt will reduce pain by overall 50% (3.5 or less out of 10) overall with usual activity Baseline:7 Goal status: New Pt will reduce pain to overall less than 2-3/10 with usual activity and work activity. Baseline: Goal status: New Pt will be able to ambulate community distances at least 750 ft with LRAD Baseline: Goal status: New 7. Patient will improve TUG time to less than 14 seconds to show improved gait speed and balance Goal status: new  PLAN: PT FREQUENCY: 1-2 times per week   PT DURATION: 6-8 weeks  PLANNED INTERVENTIONS (unless contraindicated): aquatic PT, Canalith repositioning, cryotherapy, Electrical stimulation, Iontophoresis with 4 mg/ml dexamethasome, Moist heat, traction, Ultrasound, gait training, Therapeutic exercise, balance training, neuromuscular re-education, patient/family education, prosthetic training, manual techniques, passive ROM, dry needling, taping, vasopnuematic device, vestibular, spinal manipulations, joint manipulations  PLAN FOR NEXT SESSION:  Ankle strength paticularly INV, ankle ROM, balance and gait progressions as appropriate.     April Manson, PT, DPT 03/16/2023, 11:46 AM

## 2023-03-21 ENCOUNTER — Encounter: Payer: Self-pay | Admitting: Physical Therapy

## 2023-03-21 ENCOUNTER — Ambulatory Visit (INDEPENDENT_AMBULATORY_CARE_PROVIDER_SITE_OTHER): Payer: Medicare HMO | Admitting: Physical Therapy

## 2023-03-21 DIAGNOSIS — R6889 Other general symptoms and signs: Secondary | ICD-10-CM | POA: Diagnosis not present

## 2023-03-21 DIAGNOSIS — R262 Difficulty in walking, not elsewhere classified: Secondary | ICD-10-CM | POA: Diagnosis not present

## 2023-03-21 DIAGNOSIS — R600 Localized edema: Secondary | ICD-10-CM | POA: Diagnosis not present

## 2023-03-21 DIAGNOSIS — M25671 Stiffness of right ankle, not elsewhere classified: Secondary | ICD-10-CM | POA: Diagnosis not present

## 2023-03-21 DIAGNOSIS — M25571 Pain in right ankle and joints of right foot: Secondary | ICD-10-CM | POA: Diagnosis not present

## 2023-03-21 DIAGNOSIS — M6281 Muscle weakness (generalized): Secondary | ICD-10-CM | POA: Diagnosis not present

## 2023-03-21 NOTE — Therapy (Signed)
OUTPATIENT PHYSICAL THERAPY TREATMENT  Referring diagnosis? M19.071 Treatment diagnosis? (if different than referring diagnosis) M25.571 What was this (referring dx) caused by? []  Surgery []  Fall [x]  Ongoing issue [x]  Arthritis []  Other: ____________  Laterality: [x]  Rt []  Lt []  Both  Check all possible CPT codes:  *CHOOSE 10 OR LESS*    [x]  97110 (Therapeutic Exercise)  []  92507 (SLP Treatment)  [x]  97112 (Neuro Re-ed)   []  92526 (Swallowing Treatment)   [x]  97116 (Gait Training)   []  16109 (Cognitive Training, 1st 15 minutes) [x]  97140 (Manual Therapy)   []  97130 (Cognitive Training, each add'l 15 minutes)  []  97164 (Re-evaluation)                              []  Other, List CPT Code ____________  [x]  97530 (Therapeutic Activities)     []  97535 (Self Care)   []  All codes above (97110 - 97535)  []  97012 (Mechanical Traction)  [x]  97014 (E-stim Unattended)  []  97032 (E-stim manual)  [x]  97033 (Ionto)  [x]  97035 (Ultrasound) []  97750 (Physical Performance Training) []  [id number] (Aquatic Therapy) []  97016 (Vasopneumatic Device) []  [id number] (Paraffin) []  97034 (Contrast Bath) []  97597 (Wound Care 1st 20 sq cm) []  97598 (Wound Care each add'l 20 sq cm) []  97760 (Orthotic Fabrication, Fitting, Training Initial) []  [id number] (Prosthetic Management and Training Initial) []  [id number] (Orthotic or Prosthetic Training/ Modification Subsequent)    Patient Name: Mckenzie Rogers MRN: [id number] DOB:[birth year], 82 y.o., female Today's Date: 03/21/2023  END OF SESSION:  PT End of Session - 03/21/23 1120     Visit Number 8    Number of Visits 12    Date for PT Re-Evaluation 04/19/23    Authorization Type [employer]    Authorization - Visit Number 6    Authorization - Number of Visits 12    Progress Note Due on Visit 10    PT Start Time 1055    PT Stop Time 1140    PT Time Calculation (min) 45 min    Activity Tolerance Patient tolerated treatment well;No increased pain    Behavior During  Therapy WFL for tasks assessed/performed              Past Medical History:  Diagnosis Date   Chronic kidney disease    Complication of anesthesia    "hard time waking me up after foot surgery"   Depression    Diabetes (HCC)    type 2   Edema    Family history of kidney cancer    Family history of lung cancer    Family history of pancreatic cancer    Osteoarthritis of both knees    Sleep apnea    "years ago" does not wear CPAP anymmore   Past Surgical History:  Procedure Laterality Date   ABDOMINAL HYSTERECTOMY     FOOT SURGERY Left    TOTAL KNEE ARTHROPLASTY Left 08/19/2022   Procedure: LEFT TOTAL KNEE ARTHROPLASTY;  Surgeon: Cammy Copa, MD;  Location: [facility] OR;  Service: Orthopedics;  Laterality: Left;   TUBAL LIGATION     Patient Active Problem List   Diagnosis Date Noted   Current moderate episode of major depressive disorder without prior episode (HCC) 01/10/2023   BMI 33.0-33.9,adult 01/10/2023   Primary osteoarthritis of both knees 01/10/2023   Debility 10/05/2022   Gout attack 10/04/2022   UTI (urinary tract infection) 10/04/2022   Arthritis of left knee 08/21/2022  OA (osteoarthritis) of knee 08/19/2022   S/P TKR (total knee replacement), left 08/19/2022   Controlled type 2 diabetes mellitus without complication, without long-term current use of insulin (HCC) 07/31/2021   Genetic testing 03/14/2020   Family history of pancreatic cancer    Family history of kidney cancer    Family history of lung cancer    Long term (current) use of insulin (HCC) 11/01/2018   Chronic kidney disease due to hypertension 08/24/2018   Non-thrombocytopenic purpura (HCC) 01/14/2016   Overactive bladder 01/14/2016   Obstructive sleep apnea (adult) (pediatric) 12/10/2009   Diabetic renal disease (HCC) 05/10/2009   Disorder of bone 05/10/2009   Gastro-esophageal reflux disease without esophagitis 05/10/2009    PCP: Gaspar Garbe, MD   REFERRING PROVIDER: Nadara Mustard, MD  REFERRING DIAG: [phone] (ICD-10-CM) - Arthritis of right ankle [phone] (ICD-10-CM) - Insufficiency of right posterior tibial tendon  THERAPY DIAG:  Pain in right ankle and joints of right foot  Stiffness of right ankle, not elsewhere classified  Muscle weakness (generalized)  Difficulty in walking, not elsewhere classified  Localized edema  Rationale for Evaluation and Treatment: Rehabilitation  ONSET DATE: Rt ankle pain since December 2023  SUBJECTIVE:   SUBJECTIVE STATEMENT: She notes about 8/10 ankle pain upon  PERTINENT HISTORY:PMH:Dep, OA,S/P TKR Lt 08/2022, DM, Gout  PAIN:  Are you having pain? Yes: NPRS scale: 8/10 for ankle and knee/10 Pain location: Rt medial ankle, some Rt knee pain as well Pain description: Swelling, tingles, shocking pain Aggravating factors: hurts at night when she lays down, prolonged standing Relieving factors: ankle pumps  PRECAUTIONS: None  WEIGHT BEARING RESTRICTIONS: No  FALLS:  Has patient fallen in last 6 months? 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 No acute abnormality seen.  PATIENT SURVEYS:  Eval: FOTO 48% functional intake, goal is 57%  COGNITION: Eval: Overall cognitive status: Within functional limits for tasks assessed       EDEMA:  Moderate edema in Rt ankle and foot present at eval  MUSCLE LENGTH: Eval: Tight heelcords Rt  POSTURE: Rt ankle valgus and R knee valgus noted at eval  PALPATION: Tender to palpation in medial Rt ankle, ankle hypomobility into EV/INV  LOWER EXTREMITY ROM:  Active ROM/PROM Right eval Right 03/14/23  Hip flexion    Hip extension    Hip abduction    Hip adduction    Hip internal rotation    Hip external rotation    Knee  flexion    Knee extension    Ankle dorsiflexion 5/ 7/  Ankle plantarflexion 38/   Ankle inversion 0/5 5/  Ankle eversion 5/10 20/   (Blank rows = not tested)  LOWER EXTREMITY MMT:  MMT in supine Right eval Left eval  Hip flexion    Hip extension    Hip abduction    Hip adduction    Hip internal rotation    Hip external rotation    Knee flexion    Knee extension    Ankle dorsiflexion 5   Ankle plantarflexion 4+   Ankle inversion 3   Ankle eversion 4+    (Blank rows = not tested)  LOWER EXTREMITY SPECIAL TESTS:    FUNCTIONAL TESTS:  Eval: Timed up and go (TUG): 20.9  sec with RW  Tandem balance test 4 seconds with Rt leg back, 20 seconds with Lt leg back.  GAIT: Eval Comments: antalgic gait on Rt, uses RW for now but would like to wean off of this as PLOF was community ambulation without AD   TODAY'S TREATMENT:  03/21/23 Nu step L5 LE only, seat #11 X 8 min Tandem balance 20 sec X 3 bilat Heel and toe raises 2 X 10 with UE support Seated bilat ankle inversion isometrics 5 sec 2X10 ball squeze Standing gastroc stretch on slantboard 30 sec X 3 Standing marches on foam pad with one UE support X 15 reps each bilat Seated Rt ankle INV,DF,EV, PF with red 2 X 10 each plane (more weakness noted with INV, doubled up band for PF) Standing on BOSU ball 1 min with lateral weight shifts, 1 min with A-P weight shifts, both with UE support Observed her walking with custom orthotic shoe vs sketchers shoe  03/16/23 Nu step L5 LE only, seat #11 X 6 min Tandem balance 20 sec X 3 bilat Heel and toe raises 2 X 10 with UE support Seated ankle circular rocker board X 20 AROM DF/PF, SUP/PRO, circles X10 CW and CCW Seated bilat ankle inversion isometrics 5 sec 2X10 ball squeze Standing gastroc stretch on slantboard 30 sec X 3 Standing marches on foam pad with one UE support X 10 reps each bilat Seated Rt ankle INV,DF,EV, PF with red 2 X 10 each plane (more weakness noted with INV, doubled  up band for PF)  03/14/23 Seated ankle circular rocker board X 20 AROM DF/PF, SUP/PRO, circles X10 CW and CCW Supine with leg elevated Rt ankle alphabet  with 2.5 #X 1 Supine with leg elevated Rt ankle circles with 2.5# X 15 CW and CCW Supine with leg elevated bilat ankle inversion isometrics 5 sec 2X10 ball squeze Supine with leg elevated Rt ankle INV and EV with red 2 X 10 each plane (more weakness noted with INV) Supine with leg elevated Rt ankle DF with red 2 X 10  Tandem balance 20 sec X 3 bilat Heel and toe raises 2 X 10 with UE support Nu step L5 LE only, seat #11 X 6 min   PATIENT EDUCATION: Education details: HEP, PT plan of care Person educated: Patient Education method: Explanation, Demonstration, Verbal cues, and Handouts Education comprehension: verbalized understanding and needs further education   HOME EXERCISE PROGRAM: Access Code: [email or web address] URL: [email or web address] Date: 02/22/2023 Prepared by: Ivery Quale  Exercises - Seated Ankle Alphabet  - 2 x daily - 6 x weekly - 1 sets - 1-2 reps - Seated Ankle Circles  - 2 x daily - 6 x weekly - 1 sets - 15 reps - Isometric Ankle Inversion  - 2 x daily - 6 x weekly - 2 sets - 10 reps - 5 sec hold - Standing Tandem Balance with Counter Support  - 2 x daily - 6 x weekly - 1 sets - 3 reps - 30 hold - Side Stepping with Counter Support  - 2 x daily - 6 x weekly - 1 sets - 3-5 reps - Heel Toe Raises with Counter Support  - 2 x daily - 6 x weekly - 2 sets - 10 reps  ASSESSMENT:  CLINICAL IMPRESSION: She arrives with her custom orthothic shoes as she wants PT to observe this as she walks. I do feel that she has better pronation control in her custom shoes however she feels they are too wide  for her and become loose with walking. She prefers her sketchers shoe but they are too small to allow room for ASO brace plus they do not control her excessive pronation as well.  She will continue to benefit from balance, ankle  INV strength focus and gait to meet long-term goals.  OBJECTIVE IMPAIRMENTS: decreased activity tolerance, difficulty walking, decreased balance, decreased endurance, decreased mobility, decreased ROM, decreased strength, impaired flexibility, LE use, postural dysfunction, and pain.  ACTIVITY LIMITATIONS: bending, lifting, carry, locomotion, cleaning, community activity, driving  PERSONAL FACTORS: PMH:Dep, OA,S/P TKR Lt 08/2022, DM, gout are also affecting patient's functional outcome.  REHAB POTENTIAL: Fair    CLINICAL DECISION MAKING: Evolving/moderate complexity  EVALUATION COMPLEXITY: Moderate    GOALS: Short term PT Goals Target date: 03/22/2023   Pt will be I and compliant with HEP. Baseline:  Goal status: Met 03/04/2023 Pt will decrease pain by 25% overall Baseline:7 Goal status: On Going 03/10/2023  Long term PT goals Target date:04/19/2023   Pt will improve Rt ankle AROM to WFL (10 deg DF and EV) to improve functional mobility Baseline: Goal status: New Pt will improve Rt ankle strength to at least 4+/5 in supine MMT to improve functional strength Baseline: Goal status: New Pt will improve FOTO to at least 57% functional to show improved function Baseline: Goal status: New Pt will reduce pain by overall 50% (3.5 or less out of 10) overall with usual activity Baseline:7 Goal status: New Pt will reduce pain to overall less than 2-3/10 with usual activity and work activity. Baseline: Goal status: New Pt will be able to ambulate community distances at least 750 ft with LRAD Baseline: Goal status: New 7. Patient will improve TUG time to less than 14 seconds to show improved gait speed and balance Goal status: new  PLAN: PT FREQUENCY: 1-2 times per week   PT DURATION: 6-8 weeks  PLANNED INTERVENTIONS (unless contraindicated): aquatic PT, Canalith repositioning, cryotherapy, Electrical stimulation, Iontophoresis with 4 mg/ml dexamethasome, Moist heat, traction,  Ultrasound, gait training, Therapeutic exercise, balance training, neuromuscular re-education, patient/family education, prosthetic training, manual techniques, passive ROM, dry needling, taping, vasopnuematic device, vestibular, spinal manipulations, joint manipulations  PLAN FOR NEXT SESSION:  Ankle strength paticularly INV, ankle ROM, balance and gait progressions as appropriate.     April Manson, PT, DPT 03/21/2023, 11:55 AM

## 2023-03-23 ENCOUNTER — Ambulatory Visit (INDEPENDENT_AMBULATORY_CARE_PROVIDER_SITE_OTHER): Payer: Medicare HMO | Admitting: Physical Therapy

## 2023-03-23 ENCOUNTER — Other Ambulatory Visit: Payer: Self-pay | Admitting: Family

## 2023-03-23 ENCOUNTER — Encounter: Payer: Self-pay | Admitting: Physical Therapy

## 2023-03-23 DIAGNOSIS — M25571 Pain in right ankle and joints of right foot: Secondary | ICD-10-CM

## 2023-03-23 DIAGNOSIS — R262 Difficulty in walking, not elsewhere classified: Secondary | ICD-10-CM | POA: Diagnosis not present

## 2023-03-23 DIAGNOSIS — M25671 Stiffness of right ankle, not elsewhere classified: Secondary | ICD-10-CM | POA: Diagnosis not present

## 2023-03-23 DIAGNOSIS — M6281 Muscle weakness (generalized): Secondary | ICD-10-CM | POA: Diagnosis not present

## 2023-03-23 DIAGNOSIS — R6889 Other general symptoms and signs: Secondary | ICD-10-CM | POA: Diagnosis not present

## 2023-03-23 DIAGNOSIS — R600 Localized edema: Secondary | ICD-10-CM

## 2023-03-23 MED ORDER — ALLOPURINOL 100 MG PO TABS: 100.0000 mg | ORAL_TABLET | Freq: Two times a day (BID) | ORAL | 3 refills | Status: DC

## 2023-03-23 NOTE — Therapy (Signed)
OUTPATIENT PHYSICAL THERAPY TREATMENT  Referring diagnosis? M19.071 Treatment diagnosis? (if different than referring diagnosis) M25.571 What was this (referring dx) caused by? []  Surgery []  Fall [x]  Ongoing issue [x]  Arthritis []  Other: ____________  Laterality: [x]  Rt []  Lt []  Both  Check all possible CPT codes:  *CHOOSE 10 OR LESS*    [x]  97110 (Therapeutic Exercise)  []  92507 (SLP Treatment)  [x]  97112 (Neuro Re-ed)   []  92526 (Swallowing Treatment)   [x]  97116 (Gait Training)   []  19147 (Cognitive Training, 1st 15 minutes) [x]  97140 (Manual Therapy)   []  97130 (Cognitive Training, each add'l 15 minutes)  []  97164 (Re-evaluation)                              []  Other, List CPT Code ____________  [x]  97530 (Therapeutic Activities)     []  97535 (Self Care)   []  All codes above (97110 - 97535)  []  97012 (Mechanical Traction)  [x]  97014 (E-stim Unattended)  []  97032 (E-stim manual)  [x]  97033 (Ionto)  [x]  97035 (Ultrasound) []  97750 (Physical Performance Training) []  [id number] (Aquatic Therapy) []  97016 (Vasopneumatic Device) []  [id number] (Paraffin) []  97034 (Contrast Bath) []  97597 (Wound Care 1st 20 sq cm) []  97598 (Wound Care each add'l 20 sq cm) []  97760 (Orthotic Fabrication, Fitting, Training Initial) []  [id number] (Prosthetic Management and Training Initial) []  [phone] (Orthotic or Prosthetic Training/ Modification Subsequent)    Patient Name: IRAIDA CRAGIN MRN: [id number] DOB:[birth year], 82 y.o., female Today's Date: 03/23/2023  END OF SESSION:  PT End of Session - 03/23/23 1105     Visit Number 9    Number of Visits 12    Date for PT Re-Evaluation 04/19/23    Authorization Type [employer]    Authorization - Visit Number 7    Authorization - Number of Visits 12    Progress Note Due on Visit 10    PT Start Time 1055    PT Stop Time 1135    PT Time Calculation (min) 40 min    Activity Tolerance Patient tolerated treatment well;No increased pain    Behavior During  Therapy WFL for tasks assessed/performed              Past Medical History:  Diagnosis Date   Chronic kidney disease    Complication of anesthesia    "hard time waking me up after foot surgery"   Depression    Diabetes (HCC)    type 2   Edema    Family history of kidney cancer    Family history of lung cancer    Family history of pancreatic cancer    Osteoarthritis of both knees    Sleep apnea    "years ago" does not wear CPAP anymmore   Past Surgical History:  Procedure Laterality Date   ABDOMINAL HYSTERECTOMY     FOOT SURGERY Left    TOTAL KNEE ARTHROPLASTY Left 08/19/2022   Procedure: LEFT TOTAL KNEE ARTHROPLASTY;  Surgeon: Cammy Copa, MD;  Location: [facility] OR;  Service: Orthopedics;  Laterality: Left;   TUBAL LIGATION     Patient Active Problem List   Diagnosis Date Noted   Current moderate episode of major depressive disorder without prior episode (HCC) 01/10/2023   BMI 33.0-33.9,adult 01/10/2023   Primary osteoarthritis of both knees 01/10/2023   Debility 10/05/2022   Gout attack 10/04/2022   UTI (urinary tract infection) 10/04/2022   Arthritis of left knee 08/21/2022  OA (osteoarthritis) of knee 08/19/2022   S/P TKR (total knee replacement), left 08/19/2022   Controlled type 2 diabetes mellitus without complication, without long-term current use of insulin (HCC) 07/31/2021   Genetic testing 03/14/2020   Family history of pancreatic cancer    Family history of kidney cancer    Family history of lung cancer    Long term (current) use of insulin (HCC) 11/01/2018   Chronic kidney disease due to hypertension 08/24/2018   Non-thrombocytopenic purpura (HCC) 01/14/2016   Overactive bladder 01/14/2016   Obstructive sleep apnea (adult) (pediatric) 12/10/2009   Diabetic renal disease (HCC) 05/10/2009   Disorder of bone 05/10/2009   Gastro-esophageal reflux disease without esophagitis 05/10/2009    PCP: Gaspar Garbe, MD   REFERRING PROVIDER: Nadara Mustard, MD  REFERRING DIAG: [id number] (ICD-10-CM) - Arthritis of right ankle [phone] (ICD-10-CM) - Insufficiency of right posterior tibial tendon  THERAPY DIAG:  Pain in right ankle and joints of right foot  Stiffness of right ankle, not elsewhere classified  Muscle weakness (generalized)  Difficulty in walking, not elsewhere classified  Localized edema  Rationale for Evaluation and Treatment: Rehabilitation  ONSET DATE: Rt ankle pain since December 2023  SUBJECTIVE:   SUBJECTIVE STATEMENT: She notes about 8/10 ankle pain upon  PERTINENT HISTORY:PMH:Dep, OA,S/P TKR Lt 08/2022, DM, Gout  PAIN:  Are you having pain? Yes: NPRS scale: 8/10 for ankle and knee/10 Pain location: Rt medial ankle, some Rt knee pain as well Pain description: Swelling, tingles, shocking pain Aggravating factors: hurts at night when she lays down, prolonged standing Relieving factors: ankle pumps  PRECAUTIONS: None  WEIGHT BEARING RESTRICTIONS: No  FALLS:  Has patient fallen in last 6 months? 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 No acute abnormality seen.  PATIENT SURVEYS:  Eval: FOTO 48% functional intake, goal is 57%  COGNITION: Eval: Overall cognitive status: Within functional limits for tasks assessed       EDEMA:  Moderate edema in Rt ankle and foot present at eval  MUSCLE LENGTH: Eval: Tight heelcords Rt  POSTURE: Rt ankle valgus and R knee valgus noted at eval  PALPATION: Tender to palpation in medial Rt ankle, ankle hypomobility into EV/INV  LOWER EXTREMITY ROM:  Active ROM/PROM Right eval Right 03/14/23  Hip flexion    Hip extension    Hip abduction    Hip adduction    Hip internal rotation    Hip external rotation    Knee  flexion    Knee extension    Ankle dorsiflexion 5/ 7/  Ankle plantarflexion 38/   Ankle inversion 0/5 5/  Ankle eversion 5/10 20/   (Blank rows = not tested)  LOWER EXTREMITY MMT:  MMT in supine Right eval Left eval  Hip flexion    Hip extension    Hip abduction    Hip adduction    Hip internal rotation    Hip external rotation    Knee flexion    Knee extension    Ankle dorsiflexion 5   Ankle plantarflexion 4+   Ankle inversion 3   Ankle eversion 4+    (Blank rows = not tested)  LOWER EXTREMITY SPECIAL TESTS:    FUNCTIONAL TESTS:  Eval: Timed up and go (TUG): 20.9  sec with RW  Tandem balance test 4 seconds with Rt leg back, 20 seconds with Lt leg back.  GAIT: Eval Comments: antalgic gait on Rt, uses RW for now but would like to wean off of this as PLOF was community ambulation without AD   TODAY'S TREATMENT:  03/23/23 Nu step L6 LE only, seat #11 X 6 min Standing gastroc stretch on slantboard 30 sec X 3 Tandem balance 20 sec X 3 bilat Heel and toe raises 2 X 10 with UE support Seated bilat ankle inversion isometrics 5 sec 2X10 ball squeeze Standing marches on foam pad with one UE support X 15 reps each bilat Seated Rt ankle INV,DF,EV, PF with red 2 X 10 each plane (more weakness noted with INV and has limited ROM for this, doubled up band for PF) Sidestepping at counter top without UE support, 3 round trips Modified tandem walk at counter top with one UE support 3 round trips  03/21/23 Nu step L5 LE only, seat #11 X 8 min Tandem balance 20 sec X 3 bilat Heel and toe raises 2 X 10 with UE support Seated bilat ankle inversion isometrics 5 sec 2X10 ball squeze Standing gastroc stretch on slantboard 30 sec X 3 Standing marches on foam pad with one UE support X 15 reps each bilat Seated Rt ankle INV,DF,EV, PF with red 2 X 10 each plane (more weakness noted with INV, doubled up band for PF) Standing on BOSU ball 1 min with lateral weight shifts, 1 min with A-P weight  shifts, both with UE support Observed her walking with custom orthotic shoe vs sketchers shoe  03/16/23 Nu step L5 LE only, seat #11 X 6 min Tandem balance 20 sec X 3 bilat Heel and toe raises 2 X 10 with UE support Seated ankle circular rocker board X 20 AROM DF/PF, SUP/PRO, circles X10 CW and CCW Seated bilat ankle inversion isometrics 5 sec 2X10 ball squeze Standing gastroc stretch on slantboard 30 sec X 3 Standing marches on foam pad with one UE support X 10 reps each bilat Seated Rt ankle INV,DF,EV, PF with red 2 X 10 each plane (more weakness noted with INV, doubled up band for PF)  03/14/23 Seated ankle circular rocker board X 20 AROM DF/PF, SUP/PRO, circles X10 CW and CCW Supine with leg elevated Rt ankle alphabet  with 2.5 #X 1 Supine with leg elevated Rt ankle circles with 2.5# X 15 CW and CCW Supine with leg elevated bilat ankle inversion isometrics 5 sec 2X10 ball squeze Supine with leg elevated Rt ankle INV and EV with red 2 X 10 each plane (more weakness noted with INV) Supine with leg elevated Rt ankle DF with red 2 X 10  Tandem balance 20 sec X 3 bilat Heel and toe raises 2 X 10 with UE support Nu step L5 LE only, seat #11 X 6 min   PATIENT EDUCATION: Education details: HEP, PT plan of care Person educated: Patient Education method: Explanation, Demonstration, Verbal cues, and Handouts Education comprehension: verbalized understanding and needs further education   HOME EXERCISE PROGRAM: Access Code: [email or web address] URL: [email or web address] Date: 02/22/2023 Prepared by: Ivery Quale  Exercises - Seated Ankle Alphabet  - 2 x daily - 6 x weekly - 1 sets - 1-2 reps - Seated Ankle Circles  - 2 x daily - 6 x weekly - 1 sets - 15 reps - Isometric Ankle Inversion  - 2 x daily - 6 x weekly - 2 sets - 10 reps -  5 sec hold - Standing Tandem Balance with Counter Support  - 2 x daily - 6 x weekly - 1 sets - 3 reps - 30 hold - Side Stepping with Counter Support  - 2  x daily - 6 x weekly - 1 sets - 3-5 reps - Heel Toe Raises with Counter Support  - 2 x daily - 6 x weekly - 2 sets - 10 reps  ASSESSMENT:  CLINICAL IMPRESSION: We continued to work to improve her ankle strength and functional abillities for standing and walking.  I did also provide her with new tennis balls for her RW as she had wore completely through her last pair. PT recommending to continue current PT plan.   OBJECTIVE IMPAIRMENTS: decreased activity tolerance, difficulty walking, decreased balance, decreased endurance, decreased mobility, decreased ROM, decreased strength, impaired flexibility, LE use, postural dysfunction, and pain.  ACTIVITY LIMITATIONS: bending, lifting, carry, locomotion, cleaning, community activity, driving  PERSONAL FACTORS: PMH:Dep, OA,S/P TKR Lt 08/2022, DM, gout are also affecting patient's functional outcome.  REHAB POTENTIAL: Fair    CLINICAL DECISION MAKING: Evolving/moderate complexity  EVALUATION COMPLEXITY: Moderate    GOALS: Short term PT Goals Target date: 03/22/2023   Pt will be I and compliant with HEP. Baseline:  Goal status: Met 03/04/2023 Pt will decrease pain by 25% overall Baseline:7 Goal status: On Going 03/10/2023  Long term PT goals Target date:04/19/2023   Pt will improve Rt ankle AROM to WFL (10 deg DF and EV) to improve functional mobility Baseline: Goal status: New Pt will improve Rt ankle strength to at least 4+/5 in supine MMT to improve functional strength Baseline: Goal status: New Pt will improve FOTO to at least 57% functional to show improved function Baseline: Goal status: New Pt will reduce pain by overall 50% (3.5 or less out of 10) overall with usual activity Baseline:7 Goal status: New Pt will reduce pain to overall less than 2-3/10 with usual activity and work activity. Baseline: Goal status: New Pt will be able to ambulate community distances at least 750 ft with LRAD Baseline: Goal status: New 7.  Patient will improve TUG time to less than 14 seconds to show improved gait speed and balance Goal status: new  PLAN: PT FREQUENCY: 1-2 times per week   PT DURATION: 6-8 weeks  PLANNED INTERVENTIONS (unless contraindicated): aquatic PT, Canalith repositioning, cryotherapy, Electrical stimulation, Iontophoresis with 4 mg/ml dexamethasome, Moist heat, traction, Ultrasound, gait training, Therapeutic exercise, balance training, neuromuscular re-education, patient/family education, prosthetic training, manual techniques, passive ROM, dry needling, taping, vasopnuematic device, vestibular, spinal manipulations, joint manipulations  PLAN FOR NEXT SESSION:  Will need 10th visit progress note. Ankle strength paticularly INV, ankle ROM, balance and gait progressions as appropriate.     April Manson, PT, DPT 03/23/2023, 11:06 AM

## 2023-03-25 DIAGNOSIS — Z1212 Encounter for screening for malignant neoplasm of rectum: Secondary | ICD-10-CM | POA: Diagnosis not present

## 2023-03-25 DIAGNOSIS — M109 Gout, unspecified: Secondary | ICD-10-CM | POA: Diagnosis not present

## 2023-03-25 DIAGNOSIS — N182 Chronic kidney disease, stage 2 (mild): Secondary | ICD-10-CM | POA: Diagnosis not present

## 2023-03-25 DIAGNOSIS — E1122 Type 2 diabetes mellitus with diabetic chronic kidney disease: Secondary | ICD-10-CM | POA: Diagnosis not present

## 2023-03-25 DIAGNOSIS — E669 Obesity, unspecified: Secondary | ICD-10-CM | POA: Diagnosis not present

## 2023-03-25 DIAGNOSIS — R6889 Other general symptoms and signs: Secondary | ICD-10-CM | POA: Diagnosis not present

## 2023-03-25 DIAGNOSIS — E7800 Pure hypercholesterolemia, unspecified: Secondary | ICD-10-CM | POA: Diagnosis not present

## 2023-03-25 DIAGNOSIS — M8580 Other specified disorders of bone density and structure, unspecified site: Secondary | ICD-10-CM | POA: Diagnosis not present

## 2023-03-25 DIAGNOSIS — I129 Hypertensive chronic kidney disease with stage 1 through stage 4 chronic kidney disease, or unspecified chronic kidney disease: Secondary | ICD-10-CM | POA: Diagnosis not present

## 2023-03-25 DIAGNOSIS — E559 Vitamin D deficiency, unspecified: Secondary | ICD-10-CM | POA: Diagnosis not present

## 2023-03-25 NOTE — Therapy (Signed)
OUTPATIENT PHYSICAL THERAPY PROGRESS NOTE + RECERTIFICATION  Referring diagnosis? M19.071 Treatment diagnosis? (if different than referring diagnosis) M25.571 What was this (referring dx) caused by? []  Surgery []  Fall [x]  Ongoing issue [x]  Arthritis []  Other: ____________  Laterality: [x]  Rt []  Lt []  Both  Check all possible CPT codes:  *CHOOSE 10 OR LESS*    [x]  97110 (Therapeutic Exercise)  []  92507 (SLP Treatment)  [x]  97112 (Neuro Re-ed)   []  92526 (Swallowing Treatment)   [x]  97116 (Gait Training)   []  [id number] (Cognitive Training, 1st 15 minutes) [x]  97140 (Manual Therapy)   []  97130 (Cognitive Training, each add'l 15 minutes)  []  97164 (Re-evaluation)                              []  Other, List CPT Code ____________  [x]  97530 (Therapeutic Activities)     []  97535 (Self Care)   []  All codes above (97110 - 97535)  []  97012 (Mechanical Traction)  [x]  97014 (E-stim Unattended)  []  97032 (E-stim manual)  [x]  97033 (Ionto)  [x]  97035 (Ultrasound) []  97750 (Physical Performance Training) []  [id number] (Aquatic Therapy) []  97016 (Vasopneumatic Device) []  [id number] (Paraffin) []  97034 (Contrast Bath) []  97597 (Wound Care 1st 20 sq cm) []  97598 (Wound Care each add'l 20 sq cm) []  16109 (Orthotic Fabrication, Fitting, Training Initial) []  [id number] (Prosthetic Management and Training Initial) []  [id number] (Orthotic or Prosthetic Training/ Modification Subsequent)   Progress Note Reporting Period 02/22/23 to 03/28/23  See note below for Objective Data and Assessment of Progress/Goals.       Patient Name: Mckenzie Rogers MRN: [id number] DOB:[birth year], 82 y.o., female Today's Date: 03/28/2023  END OF SESSION:  PT End of Session - 03/28/23 1101     Visit Number 10    Number of Visits 16    Date for PT Re-Evaluation 04/19/23    Authorization Type [employer]    Authorization - Visit Number 10    Authorization - Number of Visits 12    Progress Note Due on Visit 20    PT Start Time  1101    PT Stop Time 1146    PT Time Calculation (min) 45 min    Activity Tolerance Patient tolerated treatment well;No increased pain    Behavior During Therapy WFL for tasks assessed/performed               Past Medical History:  Diagnosis Date   Chronic kidney disease    Complication of anesthesia    "hard time waking me up after foot surgery"   Depression    Diabetes (HCC)    type 2   Edema    Family history of kidney cancer    Family history of lung cancer    Family history of pancreatic cancer    Osteoarthritis of both knees    Sleep apnea    "years ago" does not wear CPAP anymmore   Past Surgical History:  Procedure Laterality Date   ABDOMINAL HYSTERECTOMY     FOOT SURGERY Left    TOTAL KNEE ARTHROPLASTY Left 08/19/2022   Procedure: LEFT TOTAL KNEE ARTHROPLASTY;  Surgeon: Cammy Copa, MD;  Location: [facility] OR;  Service: Orthopedics;  Laterality: Left;   TUBAL LIGATION     Patient Active Problem List   Diagnosis Date Noted   Current moderate episode of major depressive disorder without prior episode (HCC) 01/10/2023   BMI 33.0-33.9,adult 01/10/2023   Primary osteoarthritis of both  knees 01/10/2023   Debility 10/05/2022   Gout attack 10/04/2022   UTI (urinary tract infection) 10/04/2022   Arthritis of left knee 08/21/2022   OA (osteoarthritis) of knee 08/19/2022   S/P TKR (total knee replacement), left 08/19/2022   Controlled type 2 diabetes mellitus without complication, without long-term current use of insulin (HCC) 07/31/2021   Genetic testing 03/14/2020   Family history of pancreatic cancer    Family history of kidney cancer    Family history of lung cancer    Long term (current) use of insulin (HCC) 11/01/2018   Chronic kidney disease due to hypertension 08/24/2018   Non-thrombocytopenic purpura (HCC) 01/14/2016   Overactive bladder 01/14/2016   Obstructive sleep apnea (adult) (pediatric) 12/10/2009   Diabetic renal disease (HCC) 05/10/2009    Disorder of bone 05/10/2009   Gastro-esophageal reflux disease without esophagitis 05/10/2009    PCP: Gaspar Garbe, MD   REFERRING PROVIDER: Nadara Mustard, MD  REFERRING DIAG: [phone] (ICD-10-CM) - Arthritis of right ankle [phone] (ICD-10-CM) - Insufficiency of right posterior tibial tendon  THERAPY DIAG:  Pain in right ankle and joints of right foot  Stiffness of right ankle, not elsewhere classified  Muscle weakness (generalized)  Difficulty in walking, not elsewhere classified  Localized edema  Rationale for Evaluation and Treatment: Rehabilitation  ONSET DATE: Rt ankle pain since December 2023  SUBJECTIVE:   SUBJECTIVE STATEMENT:  Pt reports 5/10 R knee pain and 5/10 ankle pain. States she is using the brace sometimes at home but not most of the time. Pt states she feels her balance is still a bit of a problem, states pain seems to fluctuate still. Has tried a little walking within home without using AD, still using RW most of the time.    PERTINENT HISTORY:PMH:Dep, OA,S/P TKR Lt 08/2022, DM, Gout  PAIN:  Are you having pain? Yes: NPRS scale: 5/10 ankle and knee on R, 3-5/10 over past week Pain location: Rt medial ankle, some Rt knee pain as well Pain description: Swelling, tingles, shocking pain Aggravating factors: hurts at night when she lays down, prolonged standing Relieving factors: ankle pumps  PRECAUTIONS: None  WEIGHT BEARING RESTRICTIONS: No  FALLS:  Has patient fallen in last 6 months? 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 No acute abnormality seen.  PATIENT SURVEYS:  Eval: FOTO 48% functional intake, goal is 57% FOTO  03/28/23: 46%   COGNITION: Eval: Overall cognitive status: Within functional limits for tasks assessed       EDEMA:  Moderate edema in Rt ankle and foot present at eval  MUSCLE LENGTH: Eval: Tight heelcords Rt  POSTURE: Rt ankle valgus and R knee valgus noted at eval  PALPATION: Tender to palpation in medial Rt ankle, ankle hypomobility into EV/INV  LOWER EXTREMITY ROM:  Active ROM/PROM Right eval Right 03/14/23 Right 03/28/23  Hip flexion     Hip extension     Hip abduction     Hip adduction     Hip internal rotation     Hip external rotation     Knee flexion     Knee extension     Ankle dorsiflexion 5/ 7/ 10 deg  Ankle plantarflexion 38/  Ankle inversion 0/5 5/ 5 deg  Ankle eversion 5/10 20/ 15 deg   (Blank rows = not tested)  LOWER EXTREMITY MMT:  MMT in supine Right eval Left eval Right 03/28/23  Hip flexion     Hip extension     Hip abduction     Hip adduction     Hip internal rotation     Hip external rotation     Knee flexion     Knee extension     Ankle dorsiflexion 5  5  Ankle plantarflexion (manual) 4+  5  Ankle inversion 3  3+ within available ROM  Ankle eversion 4+  5   (Blank rows = not tested)  LOWER EXTREMITY SPECIAL TESTS:    FUNCTIONAL TESTS:  Eval: Timed up and go (TUG): 20.9 sec with RW  Tandem balance test 4 seconds with Rt leg back, 20 seconds with Lt leg back.  03/28/23: TUG w RW  16.72sec  Tandem balance R back 30sec (first attempt), L back 30 sec (6 sec first attempt). CGA for safety, difficulty achieving full tandem  GAIT: Eval Comments: antalgic gait on Rt, uses RW for now but would like to wean off of this as PLOF was community ambulation without AD   TODAY'S TREATMENT:  [facility] Adult PT Treatment:                                                DATE: 03/28/23 Therapeutic Exercise: Red band PF x15 seated Red band DF x12 seated Red band inversion x12 Red band inversion isos w/ PT perturbations x12   Therapeutic  Activity: MSK assessment + education as it relates to functional mobility/symptom behavior  FOTO + education TUG + education as relates to mobility/fall risk Tandem stance assessment/treatment 2 bouts to fatigue Education/discussion re: progress with PT, symptom behavior as it affects activity tolerance, PT goals/POC     03/23/23 Nu step L6 LE only, seat #11 X 6 min Standing gastroc stretch on slantboard 30 sec X 3 Tandem balance 20 sec X 3 bilat Heel and toe raises 2 X 10 with UE support Seated bilat ankle inversion isometrics 5 sec 2X10 ball squeeze Standing marches on foam pad with one UE support X 15 reps each bilat Seated Rt ankle INV,DF,EV, PF with red 2 X 10 each plane (more weakness noted with INV and has limited ROM for this, doubled up band for PF) Sidestepping at counter top without UE support, 3 round trips Modified tandem walk at counter top with one UE support 3 round trips  03/21/23 Nu step L5 LE only, seat #11 X 8 min Tandem balance 20 sec X 3 bilat Heel and toe raises 2 X 10 with UE support Seated bilat ankle inversion isometrics 5 sec 2X10 ball squeze Standing gastroc stretch on slantboard 30 sec X 3 Standing marches on foam pad with one UE support X 15 reps each bilat Seated Rt ankle INV,DF,EV, PF with red 2 X 10 each plane (more weakness noted with INV, doubled up band for PF) Standing on BOSU ball 1 min with lateral weight shifts, 1 min with A-P weight shifts, both with UE support Observed her walking with custom orthotic shoe vs sketchers shoe  03/16/23 Nu step L5 LE only, seat #11 X 6 min Tandem balance 20 sec X 3 bilat Heel and toe raises 2 X 10  with UE support Seated ankle circular rocker board X 20 AROM DF/PF, SUP/PRO, circles X10 CW and CCW Seated bilat ankle inversion isometrics 5 sec 2X10 ball squeze Standing gastroc stretch on slantboard 30 sec X 3 Standing marches on foam pad with one UE support X 10 reps each bilat Seated Rt ankle INV,DF,EV, PF with  red 2 X 10 each plane (more weakness noted with INV, doubled up band for PF)  03/14/23 Seated ankle circular rocker board X 20 AROM DF/PF, SUP/PRO, circles X10 CW and CCW Supine with leg elevated Rt ankle alphabet  with 2.5 #X 1 Supine with leg elevated Rt ankle circles with 2.5# X 15 CW and CCW Supine with leg elevated bilat ankle inversion isometrics 5 sec 2X10 ball squeze Supine with leg elevated Rt ankle INV and EV with red 2 X 10 each plane (more weakness noted with INV) Supine with leg elevated Rt ankle DF with red 2 X 10  Tandem balance 20 sec X 3 bilat Heel and toe raises 2 X 10 with UE support Nu step L5 LE only, seat #11 X 6 min   PATIENT EDUCATION: Education details: rationale for interventions, HEP, PT POC Person educated: Patient Education method: Explanation, Demonstration, Verbal cues, and Handouts Education comprehension: verbalized understanding and needs further education   HOME EXERCISE PROGRAM: Access Code: [email or web address] URL: [email or web address] Date: 02/22/2023 Prepared by: Ivery Quale  Exercises - Seated Ankle Alphabet  - 2 x daily - 6 x weekly - 1 sets - 1-2 reps - Seated Ankle Circles  - 2 x daily - 6 x weekly - 1 sets - 15 reps - Isometric Ankle Inversion  - 2 x daily - 6 x weekly - 2 sets - 10 reps - 5 sec hold - Standing Tandem Balance with Counter Support  - 2 x daily - 6 x weekly - 1 sets - 3 reps - 30 hold - Side Stepping with Counter Support  - 2 x daily - 6 x weekly - 1 sets - 3-5 reps - Heel Toe Raises with Counter Support  - 2 x daily - 6 x weekly - 2 sets - 10 reps  ASSESSMENT:  CLINICAL IMPRESSION:  Pt arrives w/ report of 5/10 pain in R ankle/knee, continues to endorse fluctuating symptoms but improved overall. Looking at goals pt has made modest improvements in pain, improvements in TUG time although remains within fall risk category, see below. FOTO score at 46% which is reduced compared to 48% at start of care although pt states  she feels her activity tolerance is improving. Continuing to work on ankle strength and functional mobility. Education on fall risk as evidenced by TUG score, AD use. Pt has made fair progress with therapy as evidenced by improved TUG, tandem stance, and ankle strength, although she continues with deficits and remains a fall risk. Given this, recommend extending number of visits in POC to accommodate remaining time in initial POC. No adverse events, pt departs with no overt change in pain. Pt departs today's session in no acute distress, all voiced questions/concerns addressed appropriately from PT perspective.    OBJECTIVE IMPAIRMENTS: decreased activity tolerance, difficulty walking, decreased balance, decreased endurance, decreased mobility, decreased ROM, decreased strength, impaired flexibility, LE use, postural dysfunction, and pain.  ACTIVITY LIMITATIONS: bending, lifting, carry, locomotion, cleaning, community activity, driving  PERSONAL FACTORS: PMH:Dep, OA,S/P TKR Lt 08/2022, DM, gout are also affecting patient's functional outcome.  REHAB POTENTIAL: Fair    CLINICAL DECISION MAKING: Evolving/moderate complexity  EVALUATION COMPLEXITY:  Moderate    GOALS: Short term PT Goals Target date: 03/22/2023   Pt will be I and compliant with HEP. Baseline:  Goal status: Met 03/04/2023 Pt will decrease pain by 25% overall Baseline:7 03/28/23: 5/10 at present Goal status: On Going    Long term PT goals Target date:04/19/2023   Pt will improve Rt ankle AROM to [facility] (10 deg DF and EV) to improve functional mobility Baseline: 03/28/23: see ROM chart above  Goal status: MET    Pt will improve Rt ankle strength to at least 4+/5 in supine MMT to improve functional strength Baseline: 03/28/23: see MMT chart above  Goal status: ONGOING  Pt will improve FOTO to at least 57% functional to show improved function Baseline: 03/28/23: 46   Goal status: ONGOING  Pt will reduce pain by overall 50%  (3.5 or less out of 10) overall with usual activity Baseline:7 03/28/23: 5/10 today, 3-5/10 range over past week Goal status: ONGOING  Pt will reduce pain to overall less than 2-3/10 with usual activity and work activity. Baseline: 03/28/23: 5/10 today, 3-5/10 range over past week Goal status: ONGOING  Pt will be able to ambulate community distances at least 750 ft with LRAD Baseline: 03/28/23: continuing to ambulate w/ RW, short distances without AD in home (AD use is encouraged) Goal status: ONGOING  7. Patient will improve TUG time to less than 14 seconds to show improved gait speed and balance 03/28/23: 16 sec RW  Goal status: PROGRESSING   PLAN: updated 03/28/23 PT FREQUENCY: 1-2 times per week    PT DURATION: 3 weeks  PLANNED INTERVENTIONS (unless contraindicated): aquatic PT, Canalith repositioning, cryotherapy, Electrical stimulation, Iontophoresis with 4 mg/ml dexamethasome, Moist heat, traction, Ultrasound, gait training, Therapeutic exercise, balance training, neuromuscular re-education, patient/family education, prosthetic training, manual techniques, passive ROM, dry needling, taping, vasopnuematic device, vestibular, spinal manipulations, joint manipulations  PLAN FOR NEXT SESSION:  Continue along updated POC. Will likely need to request more authorization in coming visits to match initial POC. Continue with ankle mobility/strength and functional mobility, fall risk reduction.     Ashley Murrain PT, DPT 03/28/2023 2:51 PM

## 2023-03-28 ENCOUNTER — Ambulatory Visit: Payer: Medicare HMO | Admitting: Physical Therapy

## 2023-03-28 ENCOUNTER — Encounter: Payer: Self-pay | Admitting: Physical Therapy

## 2023-03-28 DIAGNOSIS — R262 Difficulty in walking, not elsewhere classified: Secondary | ICD-10-CM | POA: Diagnosis not present

## 2023-03-28 DIAGNOSIS — R600 Localized edema: Secondary | ICD-10-CM | POA: Diagnosis not present

## 2023-03-28 DIAGNOSIS — M25671 Stiffness of right ankle, not elsewhere classified: Secondary | ICD-10-CM

## 2023-03-28 DIAGNOSIS — M25571 Pain in right ankle and joints of right foot: Secondary | ICD-10-CM | POA: Diagnosis not present

## 2023-03-28 DIAGNOSIS — M6281 Muscle weakness (generalized): Secondary | ICD-10-CM

## 2023-03-28 DIAGNOSIS — R6889 Other general symptoms and signs: Secondary | ICD-10-CM | POA: Diagnosis not present

## 2023-03-29 DIAGNOSIS — R6889 Other general symptoms and signs: Secondary | ICD-10-CM | POA: Diagnosis not present

## 2023-04-01 ENCOUNTER — Encounter: Payer: Medicare HMO | Admitting: Physical Therapy

## 2023-04-01 DIAGNOSIS — F321 Major depressive disorder, single episode, moderate: Secondary | ICD-10-CM | POA: Diagnosis not present

## 2023-04-01 DIAGNOSIS — E7800 Pure hypercholesterolemia, unspecified: Secondary | ICD-10-CM | POA: Diagnosis not present

## 2023-04-01 DIAGNOSIS — Z1339 Encounter for screening examination for other mental health and behavioral disorders: Secondary | ICD-10-CM | POA: Diagnosis not present

## 2023-04-01 DIAGNOSIS — E1122 Type 2 diabetes mellitus with diabetic chronic kidney disease: Secondary | ICD-10-CM | POA: Diagnosis not present

## 2023-04-01 DIAGNOSIS — N182 Chronic kidney disease, stage 2 (mild): Secondary | ICD-10-CM | POA: Diagnosis not present

## 2023-04-01 DIAGNOSIS — N390 Urinary tract infection, site not specified: Secondary | ICD-10-CM | POA: Diagnosis not present

## 2023-04-01 DIAGNOSIS — Z1331 Encounter for screening for depression: Secondary | ICD-10-CM | POA: Diagnosis not present

## 2023-04-01 DIAGNOSIS — R6889 Other general symptoms and signs: Secondary | ICD-10-CM | POA: Diagnosis not present

## 2023-04-01 DIAGNOSIS — R82998 Other abnormal findings in urine: Secondary | ICD-10-CM | POA: Diagnosis not present

## 2023-04-01 DIAGNOSIS — Z0000 Encounter for general adult medical examination without abnormal findings: Secondary | ICD-10-CM | POA: Diagnosis not present

## 2023-04-01 DIAGNOSIS — I10 Essential (primary) hypertension: Secondary | ICD-10-CM | POA: Diagnosis not present

## 2023-04-01 DIAGNOSIS — Z794 Long term (current) use of insulin: Secondary | ICD-10-CM | POA: Diagnosis not present

## 2023-04-01 DIAGNOSIS — I129 Hypertensive chronic kidney disease with stage 1 through stage 4 chronic kidney disease, or unspecified chronic kidney disease: Secondary | ICD-10-CM | POA: Diagnosis not present

## 2023-04-01 DIAGNOSIS — D692 Other nonthrombocytopenic purpura: Secondary | ICD-10-CM | POA: Diagnosis not present

## 2023-04-04 ENCOUNTER — Ambulatory Visit (INDEPENDENT_AMBULATORY_CARE_PROVIDER_SITE_OTHER): Payer: Medicare HMO | Admitting: Physical Therapy

## 2023-04-04 ENCOUNTER — Encounter: Payer: Self-pay | Admitting: Physical Therapy

## 2023-04-04 DIAGNOSIS — R600 Localized edema: Secondary | ICD-10-CM

## 2023-04-04 DIAGNOSIS — M25671 Stiffness of right ankle, not elsewhere classified: Secondary | ICD-10-CM

## 2023-04-04 DIAGNOSIS — M6281 Muscle weakness (generalized): Secondary | ICD-10-CM | POA: Diagnosis not present

## 2023-04-04 DIAGNOSIS — R262 Difficulty in walking, not elsewhere classified: Secondary | ICD-10-CM

## 2023-04-04 DIAGNOSIS — M25571 Pain in right ankle and joints of right foot: Secondary | ICD-10-CM

## 2023-04-04 DIAGNOSIS — R6889 Other general symptoms and signs: Secondary | ICD-10-CM | POA: Diagnosis not present

## 2023-04-04 NOTE — Therapy (Signed)
OUTPATIENT PHYSICAL THERAPY PROGRESS NOTE + RECERTIFICATION  Referring diagnosis? M19.071 Treatment diagnosis? (if different than referring diagnosis) M25.571 What was this (referring dx) caused by? []  Surgery []  Fall [x]  Ongoing issue [x]  Arthritis []  Other: ____________  Laterality: [x]  Rt []  Lt []  Both  Check all possible CPT codes:  *CHOOSE 10 OR LESS*    [x]  97110 (Therapeutic Exercise)  []  92507 (SLP Treatment)  [x]  97112 (Neuro Re-ed)   []  92526 (Swallowing Treatment)   [x]  97116 (Gait Training)   []  [id number] (Cognitive Training, 1st 15 minutes) [x]  97140 (Manual Therapy)   []  97130 (Cognitive Training, each add'l 15 minutes)  []  97164 (Re-evaluation)                              []  Other, List CPT Code ____________  [x]  97530 (Therapeutic Activities)     []  84696 (Self Care)   []  All codes above (97110 - 97535)  []  97012 (Mechanical Traction)  [x]  97014 (E-stim Unattended)  []  97032 (E-stim manual)  [x]  97033 (Ionto)  [x]  97035 (Ultrasound) []  97750 (Physical Performance Training) []  [id number] (Aquatic Therapy) []  97016 (Vasopneumatic Device) []  [id number] (Paraffin) []  97034 (Contrast Bath) []  97597 (Wound Care 1st 20 sq cm) []  97598 (Wound Care each add'l 20 sq cm) []  97760 (Orthotic Fabrication, Fitting, Training Initial) []  [id number] (Prosthetic Management and Training Initial) []  [id number] (Orthotic or Prosthetic Training/ Modification Subsequent)        Patient Name: ALICE BURNSIDE MRN: [id number] DOB:07-29-1941, 82 y.o., female Today's Date: 04/04/2023  END OF SESSION:  PT End of Session - 04/04/23 1031     Visit Number 11    Number of Visits 16    Date for PT Re-Evaluation 04/19/23    Authorization Type [employer]    Authorization - Visit Number 11    Authorization - Number of Visits 12    Progress Note Due on Visit 20    PT Start Time 1005    PT Stop Time 1045    PT Time Calculation (min) 40 min    Activity Tolerance Patient tolerated treatment well;No  increased pain    Behavior During Therapy WFL for tasks assessed/performed                Past Medical History:  Diagnosis Date   Chronic kidney disease    Complication of anesthesia    "hard time waking me up after foot surgery"   Depression    Diabetes (HCC)    type 2   Edema    Family history of kidney cancer    Family history of lung cancer    Family history of pancreatic cancer    Osteoarthritis of both knees    Sleep apnea    "years ago" does not wear CPAP anymmore   Past Surgical History:  Procedure Laterality Date   ABDOMINAL HYSTERECTOMY     FOOT SURGERY Left    TOTAL KNEE ARTHROPLASTY Left 08/19/2022   Procedure: LEFT TOTAL KNEE ARTHROPLASTY;  Surgeon: Cammy Copa, MD;  Location: [facility] OR;  Service: Orthopedics;  Laterality: Left;   TUBAL LIGATION     Patient Active Problem List   Diagnosis Date Noted   Current moderate episode of major depressive disorder without prior episode (HCC) 01/10/2023   BMI 33.0-33.9,adult 01/10/2023   Primary osteoarthritis of both knees 01/10/2023   Debility 10/05/2022   Gout attack 10/04/2022   UTI (urinary tract infection) 10/04/2022  Arthritis of left knee 08/21/2022   OA (osteoarthritis) of knee 08/19/2022   S/P TKR (total knee replacement), left 08/19/2022   Controlled type 2 diabetes mellitus without complication, without long-term current use of insulin (HCC) 07/31/2021   Genetic testing 03/14/2020   Family history of pancreatic cancer    Family history of kidney cancer    Family history of lung cancer    Long term (current) use of insulin (HCC) 11/01/2018   Chronic kidney disease due to hypertension 08/24/2018   Non-thrombocytopenic purpura (HCC) 01/14/2016   Overactive bladder 01/14/2016   Obstructive sleep apnea (adult) (pediatric) 12/10/2009   Diabetic renal disease (HCC) 05/10/2009   Disorder of bone 05/10/2009   Gastro-esophageal reflux disease without esophagitis 05/10/2009    PCP: Gaspar Garbe, MD   REFERRING PROVIDER: Nadara Mustard, MD  REFERRING DIAG: [phone] (ICD-10-CM) - Arthritis of right ankle [phone] (ICD-10-CM) - Insufficiency of right posterior tibial tendon  THERAPY DIAG:  Pain in right ankle and joints of right foot  Stiffness of right ankle, not elsewhere classified  Muscle weakness (generalized)  Difficulty in walking, not elsewhere classified  Localized edema  Rationale for Evaluation and Treatment: Rehabilitation  ONSET DATE: Rt ankle pain since December 2023  SUBJECTIVE:   SUBJECTIVE STATEMENT:  She relays some soreness, has been overall walking more and moving around more.    PERTINENT HISTORY:PMH:Dep, OA,S/P TKR Lt 08/2022, DM, Gout  PAIN:  Are you having pain? Yes: NPRS scale: 0/10 ankle and 8/10 knee upon arrival.  Pain location: Rt medial ankle, some Rt knee pain as well Pain description: Swelling, tingles, shocking pain Aggravating factors: hurts at night when she lays down, prolonged standing Relieving factors: ankle pumps  PRECAUTIONS: None  WEIGHT BEARING RESTRICTIONS: No  FALLS:  Has patient fallen in last 6 months? 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 No acute abnormality seen.  PATIENT SURVEYS:  Eval: FOTO 48% functional intake, goal is 57% FOTO 03/28/23: 46%   COGNITION: Eval: Overall cognitive status: Within functional limits for tasks assessed       EDEMA:  Moderate edema in Rt ankle and foot present at eval  MUSCLE LENGTH: Eval: Tight heelcords Rt  POSTURE: Rt ankle valgus and R knee valgus noted at eval  PALPATION: Tender to palpation in medial Rt ankle, ankle hypomobility into  EV/INV  LOWER EXTREMITY ROM:  Active ROM/PROM Right eval Right 03/14/23 Right 03/28/23  Hip flexion     Hip extension     Hip abduction     Hip adduction     Hip internal rotation     Hip external rotation     Knee flexion     Knee extension     Ankle dorsiflexion 5/ 7/ 10 deg  Ankle plantarflexion 38/    Ankle inversion 0/5 5/ 5 deg  Ankle eversion 5/10 20/ 15 deg   (Blank rows = not tested)  LOWER EXTREMITY MMT:  MMT in supine Right eval Left eval Right 03/28/23  Hip flexion     Hip extension     Hip abduction     Hip adduction     Hip internal rotation     Hip external rotation  Knee flexion     Knee extension     Ankle dorsiflexion 5  5  Ankle plantarflexion (manual) 4+  5  Ankle inversion 3  3+ within available ROM  Ankle eversion 4+  5   (Blank rows = not tested)  LOWER EXTREMITY SPECIAL TESTS:    FUNCTIONAL TESTS:  Eval: Timed up and go (TUG): 20.9 sec with RW  Tandem balance test 4 seconds with Rt leg back, 20 seconds with Lt leg back.  03/28/23: TUG w RW  16.72sec  Tandem balance R back 30sec (first attempt), L back 30 sec (6 sec first attempt). CGA for safety, difficulty achieving full tandem  GAIT: Eval Comments: antalgic gait on Rt, uses RW for now but would like to wean off of this as PLOF was community ambulation without AD   TODAY'S TREATMENT:  04/04/23 Nu step L5 LE only, seat #10 X 6 min Standing gastroc stretch on slantboard 30 sec X 3 Tandem balance 30 sec X 2 bilat Heel and toe raises 2 X 10 with UE support Seated LAQ X 20 reps bilat holding 2 sec at top Seated SLR 2X10 bilat Seated bilat ankle inversion isometrics 5 sec 2X10 ball squeeze Seated Rt ankle INV,DF,EV, PF with red 2 X 10 each plane (more weakness noted with INV and has limited ROM for this, doubled up band for PF) Seated hamstring curls red 2X10 bilat Leg press machine DL 25# [id number]  [facility] Adult PT Treatment:                                                DATE:  03/28/23 Therapeutic Exercise: Red band PF x15 seated Red band DF x12 seated Red band inversion x12 Red band inversion isos w/ PT perturbations x12   Therapeutic Activity: MSK assessment + education as it relates to functional mobility/symptom behavior  FOTO + education TUG + education as relates to mobility/fall risk Tandem stance assessment/treatment 2 bouts to fatigue Education/discussion re: progress with PT, symptom behavior as it affects activity tolerance, PT goals/POC      PATIENT EDUCATION: Education details: rationale for interventions, HEP, PT POC Person educated: Patient Education method: Explanation, Demonstration, Verbal cues, and Handouts Education comprehension: verbalized understanding and needs further education   HOME EXERCISE PROGRAM: Access Code: [email or web address] URL: [email or web address] Date: 02/22/2023 Prepared by: Ivery Quale  Exercises - Seated Ankle Alphabet  - 2 x daily - 6 x weekly - 1 sets - 1-2 reps - Seated Ankle Circles  - 2 x daily - 6 x weekly - 1 sets - 15 reps - Isometric Ankle Inversion  - 2 x daily - 6 x weekly - 2 sets - 10 reps - 5 sec hold - Standing Tandem Balance with Counter Support  - 2 x daily - 6 x weekly - 1 sets - 3 reps - 30 hold - Side Stepping with Counter Support  - 2 x daily - 6 x weekly - 1 sets - 3-5 reps - Heel Toe Raises with Counter Support  - 2 x daily - 6 x weekly - 2 sets - 10 reps  ASSESSMENT:  CLINICAL IMPRESSION:  She had more knee complaints than ankle complaints today so I did show her additional knee exercises which did appear to help with her pain after session. We also continued with ankle strength and balance work as she still  has deficits with this.    OBJECTIVE IMPAIRMENTS: decreased activity tolerance, difficulty walking, decreased balance, decreased endurance, decreased mobility, decreased ROM, decreased strength, impaired flexibility, LE use, postural dysfunction, and pain.  ACTIVITY  LIMITATIONS: bending, lifting, carry, locomotion, cleaning, community activity, driving  PERSONAL FACTORS: PMH:Dep, OA,S/P TKR Lt 08/2022, DM, gout are also affecting patient's functional outcome.  REHAB POTENTIAL: Fair    CLINICAL DECISION MAKING: Evolving/moderate complexity  EVALUATION COMPLEXITY: Moderate    GOALS: Short term PT Goals Target date: 03/22/2023   Pt will be I and compliant with HEP. Baseline:  Goal status: Met 03/04/2023 Pt will decrease pain by 25% overall Baseline:7 03/28/23: 5/10 at present Goal status: On Going    Long term PT goals Target date:04/19/2023   Pt will improve Rt ankle AROM to [facility] (10 deg DF and EV) to improve functional mobility Baseline: 03/28/23: see ROM chart above  Goal status: MET    Pt will improve Rt ankle strength to at least 4+/5 in supine MMT to improve functional strength Baseline: 03/28/23: see MMT chart above  Goal status: ONGOING  Pt will improve FOTO to at least 57% functional to show improved function Baseline: 03/28/23: 46   Goal status: ONGOING  Pt will reduce pain by overall 50% (3.5 or less out of 10) overall with usual activity Baseline:7 03/28/23: 5/10 today, 3-5/10 range over past week Goal status: ONGOING  Pt will reduce pain to overall less than 2-3/10 with usual activity and work activity. Baseline: 03/28/23: 5/10 today, 3-5/10 range over past week Goal status: ONGOING  Pt will be able to ambulate community distances at least 750 ft with LRAD Baseline: 03/28/23: continuing to ambulate w/ RW, short distances without AD in home (AD use is encouraged) Goal status: ONGOING  7. Patient will improve TUG time to less than 14 seconds to show improved gait speed and balance 03/28/23: 16 sec RW  Goal status: PROGRESSING   PLAN: updated 03/28/23 PT FREQUENCY: 1-2 times per week    PT DURATION: 3 weeks  PLANNED INTERVENTIONS (unless contraindicated): aquatic PT, Canalith repositioning, cryotherapy, Electrical  stimulation, Iontophoresis with 4 mg/ml dexamethasome, Moist heat, traction, Ultrasound, gait training, Therapeutic exercise, balance training, neuromuscular re-education, patient/family education, prosthetic training, manual techniques, passive ROM, dry needling, taping, vasopnuematic device, vestibular, spinal manipulations, joint manipulations  PLAN FOR NEXT SESSION:  Will need [employer] reauthorization if she wants to continue with PT. Continue with ankle mobility/strength and functional mobility, fall risk reduction.   Ivery Quale, PT, DPT 04/04/23 10:50 AM

## 2023-04-06 ENCOUNTER — Encounter: Payer: Self-pay | Admitting: Physical Therapy

## 2023-04-06 ENCOUNTER — Ambulatory Visit (INDEPENDENT_AMBULATORY_CARE_PROVIDER_SITE_OTHER): Payer: Medicare HMO | Admitting: Physical Therapy

## 2023-04-06 ENCOUNTER — Encounter: Payer: Medicare HMO | Admitting: Physical Therapy

## 2023-04-06 DIAGNOSIS — R262 Difficulty in walking, not elsewhere classified: Secondary | ICD-10-CM

## 2023-04-06 DIAGNOSIS — M25671 Stiffness of right ankle, not elsewhere classified: Secondary | ICD-10-CM

## 2023-04-06 DIAGNOSIS — M6281 Muscle weakness (generalized): Secondary | ICD-10-CM | POA: Diagnosis not present

## 2023-04-06 DIAGNOSIS — M25571 Pain in right ankle and joints of right foot: Secondary | ICD-10-CM | POA: Diagnosis not present

## 2023-04-06 DIAGNOSIS — R600 Localized edema: Secondary | ICD-10-CM

## 2023-04-06 DIAGNOSIS — R6889 Other general symptoms and signs: Secondary | ICD-10-CM | POA: Diagnosis not present

## 2023-04-06 NOTE — Therapy (Signed)
OUTPATIENT PHYSICAL THERAPY PROGRESS NOTE + RECERTIFICATION  Referring diagnosis? M19.071 Treatment diagnosis? (if different than referring diagnosis) M25.571 What was this (referring dx) caused by? []  Surgery []  Fall [x]  Ongoing issue [x]  Arthritis []  Other: ____________  Laterality: [x]  Rt []  Lt []  Both  Check all possible CPT codes:  *CHOOSE 10 OR LESS*    [x]  97110 (Therapeutic Exercise)  []  92507 (SLP Treatment)  [x]  97112 (Neuro Re-ed)   []  92526 (Swallowing Treatment)   [x]  97116 (Gait Training)   []  [id number] (Cognitive Training, 1st 15 minutes) [x]  97140 (Manual Therapy)   []  97130 (Cognitive Training, each add'l 15 minutes)  []  97164 (Re-evaluation)                              []  Other, List CPT Code ____________  [x]  97530 (Therapeutic Activities)     []  16109 (Self Care)   []  All codes above (97110 - 97535)  []  97012 (Mechanical Traction)  [x]  97014 (E-stim Unattended)  []  97032 (E-stim manual)  [x]  97033 (Ionto)  [x]  97035 (Ultrasound) []  97750 (Physical Performance Training) []  [id number] (Aquatic Therapy) []  97016 (Vasopneumatic Device) []  [id number] (Paraffin) []  97034 (Contrast Bath) []  97597 (Wound Care 1st 20 sq cm) []  97598 (Wound Care each add'l 20 sq cm) []  97760 (Orthotic Fabrication, Fitting, Training Initial) []  [id number] (Prosthetic Management and Training Initial) []  [id number] (Orthotic or Prosthetic Training/ Modification Subsequent)        Patient Name: Mckenzie Rogers MRN: [id number] DOB:[birth year], 82 y.o., female Today's Date: 04/06/2023  END OF SESSION:  PT End of Session - 04/06/23 0915     Visit Number 12    Number of Visits 16    Date for PT Re-Evaluation 04/19/23    Authorization Type [employer]    Authorization - Visit Number 12    Authorization - Number of Visits 12    Progress Note Due on Visit 20    PT Start Time 0915    PT Stop Time 0955    PT Time Calculation (min) 40 min    Activity Tolerance Patient tolerated treatment well;No  increased pain    Behavior During Therapy WFL for tasks assessed/performed                Past Medical History:  Diagnosis Date   Chronic kidney disease    Complication of anesthesia    "hard time waking me up after foot surgery"   Depression    Diabetes (HCC)    type 2   Edema    Family history of kidney cancer    Family history of lung cancer    Family history of pancreatic cancer    Osteoarthritis of both knees    Sleep apnea    "years ago" does not wear CPAP anymmore   Past Surgical History:  Procedure Laterality Date   ABDOMINAL HYSTERECTOMY     FOOT SURGERY Left    TOTAL KNEE ARTHROPLASTY Left 08/19/2022   Procedure: LEFT TOTAL KNEE ARTHROPLASTY;  Surgeon: Cammy Copa, MD;  Location: [facility] OR;  Service: Orthopedics;  Laterality: Left;   TUBAL LIGATION     Patient Active Problem List   Diagnosis Date Noted   Current moderate episode of major depressive disorder without prior episode (HCC) 01/10/2023   BMI 33.0-33.9,adult 01/10/2023   Primary osteoarthritis of both knees 01/10/2023   Debility 10/05/2022   Gout attack 10/04/2022   UTI (urinary tract infection) 10/04/2022  Arthritis of left knee 08/21/2022   OA (osteoarthritis) of knee 08/19/2022   S/P TKR (total knee replacement), left 08/19/2022   Controlled type 2 diabetes mellitus without complication, without long-term current use of insulin (HCC) 07/31/2021   Genetic testing 03/14/2020   Family history of pancreatic cancer    Family history of kidney cancer    Family history of lung cancer    Long term (current) use of insulin (HCC) 11/01/2018   Chronic kidney disease due to hypertension 08/24/2018   Non-thrombocytopenic purpura (HCC) 01/14/2016   Overactive bladder 01/14/2016   Obstructive sleep apnea (adult) (pediatric) 12/10/2009   Diabetic renal disease (HCC) 05/10/2009   Disorder of bone 05/10/2009   Gastro-esophageal reflux disease without esophagitis 05/10/2009    PCP: Gaspar Garbe, MD   REFERRING PROVIDER: Nadara Mustard, MD  REFERRING DIAG: [phone] (ICD-10-CM) - Arthritis of right ankle [phone] (ICD-10-CM) - Insufficiency of right posterior tibial tendon  THERAPY DIAG:  Pain in right ankle and joints of right foot  Stiffness of right ankle, not elsewhere classified  Muscle weakness (generalized)  Difficulty in walking, not elsewhere classified  Localized edema  Rationale for Evaluation and Treatment: Rehabilitation  ONSET DATE: Rt ankle pain since December 2023  SUBJECTIVE:   SUBJECTIVE STATEMENT:  She relays pain is about 7/10 today in her ankle   PERTINENT HISTORY:PMH:Dep, OA,S/P TKR Lt 08/2022, DM, Gout  PAIN:  Are you having pain? Yes: NPRS scale: 7/10 ankle upon arrival.  Pain location: Rt medial ankle, some Rt knee pain as well Pain description: Swelling, tingles, shocking pain Aggravating factors: hurts at night when she lays down, prolonged standing Relieving factors: ankle pumps  PRECAUTIONS: None  WEIGHT BEARING RESTRICTIONS: No  FALLS:  Has patient fallen in last 6 months? 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 No acute abnormality seen.  PATIENT SURVEYS:  Eval: FOTO 48% functional intake, goal is 57% FOTO 03/28/23: 46%   COGNITION: Eval: Overall cognitive status: Within functional limits for tasks assessed       EDEMA:  Moderate edema in Rt ankle and foot present at eval  MUSCLE LENGTH: Eval: Tight heelcords Rt  POSTURE: Rt ankle valgus and R knee valgus noted at eval  PALPATION: Tender to palpation in medial Rt ankle, ankle hypomobility into EV/INV  LOWER EXTREMITY ROM:  Active ROM/PROM  Right eval Right 03/14/23 Right 03/28/23  Hip flexion     Hip extension     Hip abduction     Hip adduction     Hip internal rotation     Hip external rotation     Knee flexion     Knee extension     Ankle dorsiflexion 5/ 7/ 10 deg  Ankle plantarflexion 38/    Ankle inversion 0/5 5/ 5 deg  Ankle eversion 5/10 20/ 15 deg   (Blank rows = not tested)  LOWER EXTREMITY MMT:  MMT in supine Right eval Left eval Right 03/28/23  Hip flexion     Hip extension     Hip abduction     Hip adduction     Hip internal rotation     Hip external rotation     Knee flexion  Knee extension     Ankle dorsiflexion 5  5  Ankle plantarflexion (manual) 4+  5  Ankle inversion 3  3+ within available ROM  Ankle eversion 4+  5   (Blank rows = not tested)  LOWER EXTREMITY SPECIAL TESTS:    FUNCTIONAL TESTS:  Eval: Timed up and go (TUG): 20.9 sec with RW  Tandem balance test 4 seconds with Rt leg back, 20 seconds with Lt leg back.  03/28/23: TUG w RW  16.72sec  Tandem balance R back 30sec (first attempt), L back 30 sec (6 sec first attempt). CGA for safety, difficulty achieving full tandem  GAIT: Eval Comments: antalgic gait on Rt, uses RW for now but would like to wean off of this as PLOF was community ambulation without AD   TODAY'S TREATMENT:  04/06/23 Nu step L5 LE only, seat #10 X 6 min Standing gastroc stretch on slantboard 30 sec X 3 Tandem balance 30 sec X 2 bilat Heel and toe raises 2 X 10 with UE support Seated LAQ X 20 reps bilat with 2# Seated SLR 2X10 bilat Sit to stands with UE support X 5 Seated bilat ankle inversion isometrics 5 sec 2X10 ball squeeze Seated Rt ankle INV,EV, 2X10 each using RW leg to anchor band and other foot to block RW from moving. Demo and cues to set up at home to make it easer to do this one for HEP, she verbalizes understanding. (more weakness noted with INV and has limited ROM for this Seated hamstring curls red 2X10 bilat Leg press machine DL 96#  [id number]  0/98/11 Nu step L5 LE only, seat #10 X 6 min Standing gastroc stretch on slantboard 30 sec X 3 Tandem balance 30 sec X 2 bilat Heel and toe raises 2 X 10 with UE support Seated LAQ X 20 reps bilat holding 2 sec at top Seated SLR 2X10 bilat Seated bilat ankle inversion isometrics 5 sec 2X10 ball squeeze Seated Rt ankle INV,DF,EV, PF with red 2 X 10 each plane (more weakness noted with INV and has limited ROM for this, doubled up band for PF) Seated hamstring curls red 2X10 bilat Leg press machine DL 91# [id number]     PATIENT EDUCATION: Education details: rationale for interventions, HEP, PT POC Person educated: Patient Education method: Explanation, Demonstration, Verbal cues, and Handouts Education comprehension: verbalized understanding and needs further education   HOME EXERCISE PROGRAM: Access Code: [email or web address] URL: [email or web address] Date: 02/22/2023 Prepared by: Ivery Quale  Exercises - Seated Ankle Alphabet  - 2 x daily - 6 x weekly - 1 sets - 1-2 reps - Seated Ankle Circles  - 2 x daily - 6 x weekly - 1 sets - 15 reps - Isometric Ankle Inversion  - 2 x daily - 6 x weekly - 2 sets - 10 reps - 5 sec hold - Standing Tandem Balance with Counter Support  - 2 x daily - 6 x weekly - 1 sets - 3 reps - 30 hold - Side Stepping with Counter Support  - 2 x daily - 6 x weekly - 1 sets - 3-5 reps - Heel Toe Raises with Counter Support  - 2 x daily - 6 x weekly - 2 sets - 10 reps  ASSESSMENT:  CLINICAL IMPRESSION:  She had less knee complaints but more ankle complaints. She was having a difficult time doing ankle INV band strenghtening due to difficulty crossing her legs to anchor band so we modified this by using her RW to anchor band  which worked a lot better. She can now have more success performing this at home which will hopefully lead to improved Inversion strength.   OBJECTIVE IMPAIRMENTS: decreased activity tolerance, difficulty walking, decreased balance,  decreased endurance, decreased mobility, decreased ROM, decreased strength, impaired flexibility, LE use, postural dysfunction, and pain.  ACTIVITY LIMITATIONS: bending, lifting, carry, locomotion, cleaning, community activity, driving  PERSONAL FACTORS: PMH:Dep, OA,S/P TKR Lt 08/2022, DM, gout are also affecting patient's functional outcome.  REHAB POTENTIAL: Fair    CLINICAL DECISION MAKING: Evolving/moderate complexity  EVALUATION COMPLEXITY: Moderate    GOALS: Short term PT Goals Target date: 03/22/2023   Pt will be I and compliant with HEP. Baseline:  Goal status: Met 03/04/2023 Pt will decrease pain by 25% overall Baseline:7 03/28/23: 5/10 at present Goal status: On Going    Long term PT goals Target date:04/19/2023   Pt will improve Rt ankle AROM to [facility] (10 deg DF and EV) to improve functional mobility Baseline: 03/28/23: see ROM chart above  Goal status: MET    Pt will improve Rt ankle strength to at least 4+/5 in supine MMT to improve functional strength Baseline: 03/28/23: see MMT chart above  Goal status: ONGOING  Pt will improve FOTO to at least 57% functional to show improved function Baseline: 03/28/23: 46   Goal status: ONGOING  Pt will reduce pain by overall 50% (3.5 or less out of 10) overall with usual activity Baseline:7 03/28/23: 5/10 today, 3-5/10 range over past week Goal status: ONGOING  Pt will reduce pain to overall less than 2-3/10 with usual activity and work activity. Baseline: 03/28/23: 5/10 today, 3-5/10 range over past week Goal status: ONGOING  Pt will be able to ambulate community distances at least 750 ft with LRAD Baseline: 03/28/23: continuing to ambulate w/ RW, short distances without AD in home (AD use is encouraged) Goal status: ONGOING  7. Patient will improve TUG time to less than 14 seconds to show improved gait speed and balance 03/28/23: 16 sec RW  Goal status: PROGRESSING   PLAN: updated 03/28/23 PT FREQUENCY: 1-2 times  per week    PT DURATION: 3 weeks  PLANNED INTERVENTIONS (unless contraindicated): aquatic PT, Canalith repositioning, cryotherapy, Electrical stimulation, Iontophoresis with 4 mg/ml dexamethasome, Moist heat, traction, Ultrasound, gait training, Therapeutic exercise, balance training, neuromuscular re-education, patient/family education, prosthetic training, manual techniques, passive ROM, dry needling, taping, vasopnuematic device, vestibular, spinal manipulations, joint manipulations  PLAN FOR NEXT SESSION:  Will need [employer] reauthorization next visit.  Continue with ankle mobility/strength and functional mobility, fall risk reduction.   Ivery Quale, PT, DPT 04/06/23 9:17 AM

## 2023-04-07 ENCOUNTER — Encounter: Payer: Medicare HMO | Admitting: Physical Therapy

## 2023-04-11 ENCOUNTER — Encounter: Payer: Self-pay | Admitting: Physical Therapy

## 2023-04-11 ENCOUNTER — Ambulatory Visit (INDEPENDENT_AMBULATORY_CARE_PROVIDER_SITE_OTHER): Payer: Medicare HMO | Admitting: Physical Therapy

## 2023-04-11 DIAGNOSIS — R6889 Other general symptoms and signs: Secondary | ICD-10-CM | POA: Diagnosis not present

## 2023-04-11 DIAGNOSIS — R600 Localized edema: Secondary | ICD-10-CM | POA: Diagnosis not present

## 2023-04-11 DIAGNOSIS — R262 Difficulty in walking, not elsewhere classified: Secondary | ICD-10-CM

## 2023-04-11 DIAGNOSIS — M25671 Stiffness of right ankle, not elsewhere classified: Secondary | ICD-10-CM | POA: Diagnosis not present

## 2023-04-11 DIAGNOSIS — M6281 Muscle weakness (generalized): Secondary | ICD-10-CM

## 2023-04-11 DIAGNOSIS — M25571 Pain in right ankle and joints of right foot: Secondary | ICD-10-CM | POA: Diagnosis not present

## 2023-04-11 NOTE — Therapy (Signed)
OUTPATIENT PHYSICAL THERAPY TREATMENT      Patient Name: Mckenzie Rogers MRN: [id number] DOB:1940/10/20, 82 y.o., female Today's Date: 04/11/2023  END OF SESSION:  PT End of Session - 04/11/23 1349     Visit Number 13    Number of Visits 16    Date for PT Re-Evaluation 04/19/23    Authorization Type [employer]    Authorization - Number of Visits 12    Progress Note Due on Visit 20    PT Start Time 1345    PT Stop Time 1430    PT Time Calculation (min) 45 min    Activity Tolerance Patient tolerated treatment well;No increased pain    Behavior During Therapy WFL for tasks assessed/performed                Past Medical History:  Diagnosis Date   Chronic kidney disease    Complication of anesthesia    "hard time waking me up after foot surgery"   Depression    Diabetes (HCC)    type 2   Edema    Family history of kidney cancer    Family history of lung cancer    Family history of pancreatic cancer    Osteoarthritis of both knees    Sleep apnea    "years ago" does not wear CPAP anymmore   Past Surgical History:  Procedure Laterality Date   ABDOMINAL HYSTERECTOMY     FOOT SURGERY Left    TOTAL KNEE ARTHROPLASTY Left 08/19/2022   Procedure: LEFT TOTAL KNEE ARTHROPLASTY;  Surgeon: Cammy Copa, MD;  Location: [facility] OR;  Service: Orthopedics;  Laterality: Left;   TUBAL LIGATION     Patient Active Problem List   Diagnosis Date Noted   Current moderate episode of major depressive disorder without prior episode (HCC) 01/10/2023   BMI 33.0-33.9,adult 01/10/2023   Primary osteoarthritis of both knees 01/10/2023   Debility 10/05/2022   Gout attack 10/04/2022   UTI (urinary tract infection) 10/04/2022   Arthritis of left knee 08/21/2022   OA (osteoarthritis) of knee 08/19/2022   S/P TKR (total knee replacement), left 08/19/2022   Controlled type 2 diabetes mellitus without complication, without long-term current use of insulin (HCC) 07/31/2021   Genetic testing  03/14/2020   Family history of pancreatic cancer    Family history of kidney cancer    Family history of lung cancer    Long term (current) use of insulin (HCC) 11/01/2018   Chronic kidney disease due to hypertension 08/24/2018   Non-thrombocytopenic purpura (HCC) 01/14/2016   Overactive bladder 01/14/2016   Obstructive sleep apnea (adult) (pediatric) 12/10/2009   Diabetic renal disease (HCC) 05/10/2009   Disorder of bone 05/10/2009   Gastro-esophageal reflux disease without esophagitis 05/10/2009    PCP: Gaspar Garbe, MD   REFERRING PROVIDER: Nadara Mustard, MD  REFERRING DIAG: [phone] (ICD-10-CM) - Arthritis of right ankle K74.259 (ICD-10-CM) - Insufficiency of right posterior tibial tendon  THERAPY DIAG:  Pain in right ankle and joints of right foot  Stiffness of right ankle, not elsewhere classified  Muscle weakness (generalized)  Difficulty in walking, not elsewhere classified  Localized edema  Rationale for Evaluation and Treatment: Rehabilitation  ONSET DATE: Rt ankle pain since December 2023  SUBJECTIVE:   SUBJECTIVE STATEMENT:  She relays pain is about 5/10 in her knee today   PERTINENT HISTORY:PMH:Dep, OA,S/P TKR Lt 08/2022, DM, Gout  PAIN:  Are you having pain? Yes: NPRS scale: 7/10 ankle upon arrival.  Pain location:  Rt medial ankle, some Rt knee pain as well Pain description: Swelling, tingles, shocking pain Aggravating factors: hurts at night when she lays down, prolonged standing Relieving factors: ankle pumps  PRECAUTIONS: None  WEIGHT BEARING RESTRICTIONS: No  FALLS:  Has patient fallen in last 6 months? 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 No acute abnormality seen.  PATIENT SURVEYS:  Eval: FOTO 48% functional intake, goal is 57% FOTO 03/28/23: 46%   COGNITION: Eval: Overall cognitive status: Within functional limits for tasks assessed       EDEMA:  Moderate edema in Rt ankle and foot present at eval  MUSCLE LENGTH: Eval: Tight heelcords Rt  POSTURE: Rt ankle valgus and R knee valgus noted at eval  PALPATION: Tender to palpation in medial Rt ankle, ankle hypomobility into EV/INV  LOWER EXTREMITY ROM:  Active ROM/PROM Right eval Right 03/14/23 Right 03/28/23  Hip flexion     Hip extension     Hip abduction     Hip adduction     Hip internal rotation     Hip external rotation     Knee flexion     Knee extension     Ankle dorsiflexion 5/ 7/ 10 deg  Ankle plantarflexion 38/    Ankle inversion 0/5 5/ 5 deg  Ankle eversion 5/10 20/ 15 deg   (Blank rows = not tested)  LOWER EXTREMITY MMT:  MMT in supine Right eval Left eval Right 03/28/23  Hip flexion     Hip extension     Hip abduction     Hip adduction     Hip internal rotation     Hip external rotation     Knee flexion     Knee extension     Ankle dorsiflexion 5  5  Ankle plantarflexion (manual) 4+  5  Ankle inversion 3  3+ within available ROM  Ankle eversion 4+  5   (Blank rows = not tested)  LOWER EXTREMITY SPECIAL TESTS:    FUNCTIONAL TESTS:  Eval: Timed up and go (TUG): 20.9 sec with RW  Tandem balance test 4 seconds with Rt leg back, 20 seconds with Lt leg back.  03/28/23: TUG w RW  16.72sec  Tandem balance R back 30sec (first attempt), L back 30 sec (6 sec first attempt). CGA for safety, difficulty achieving full tandem  GAIT: Eval Comments: antalgic gait on Rt, uses RW for now but would like to wean off of this as PLOF was community ambulation without AD   TODAY'S TREATMENT:  04/11/23 Nu step L5 LE only, seat #10 X 6 min Gait training with SPC with cues and demo for proper technique and sequencing.   Standing gastroc stretch on slantboard 30 sec X 3 Tandem balance 30 sec X 2 bilat Heel and toe raises 2 X 10 with UE support Seated SLR 2X10 bilat Sit to stands with UE support X 5 Seated bilat ankle inversion isometrics 5 sec 2X10 ball squeeze Seated Rt ankle INV,EV, 2X10 each using RW leg to anchor band and other foot to block RW from moving. Demo and cues to set up at home to make it easer to do this one for HEP, she verbalizes understanding. (more  weakness noted with INV and has limited ROM for this Step ups on 4 inch step X 10 bilat, with bilat UE support Leg press machine DL 72# [id number]  4/40/34 Nu step L5 LE only, seat #10 X 6 min Standing gastroc stretch on slantboard 30 sec X 3 Tandem balance 30 sec X 2 bilat Heel and toe raises 2 X 10 with UE support Seated LAQ X 20 reps bilat with 2# Seated SLR 2X10 bilat Sit to stands with UE support X 5 Seated bilat ankle inversion isometrics 5 sec 2X10 ball squeeze Seated Rt ankle INV,EV, 2X10 each using RW leg to anchor band and other foot to block RW from moving. Demo and cues to set up at home to make it easer to do this one for HEP, she verbalizes understanding. (more weakness noted with INV and has limited ROM for this Seated hamstring curls red 2X10 bilat Leg press machine DL 74# [id number]  6/38/75 Nu step L5 LE only, seat #10 X 6 min Standing gastroc stretch on slantboard 30 sec X 3 Tandem balance 30 sec X 2 bilat Heel and toe raises 2 X 10 with UE support Seated LAQ X 20 reps bilat holding 2 sec at top Seated SLR 2X10 bilat Seated bilat ankle inversion isometrics 5 sec 2X10 ball squeeze Seated Rt ankle INV,DF,EV, PF with red 2 X 10 each plane (more weakness noted with INV and has limited ROM for this, doubled up band for PF) Seated hamstring curls red 2X10 bilat Leg press machine DL 64# [id number]     PATIENT EDUCATION: Education details: rationale for interventions, HEP, PT POC Person educated: Patient Education method:  Explanation, Demonstration, Verbal cues, and Handouts Education comprehension: verbalized understanding and needs further education   HOME EXERCISE PROGRAM: Access Code: [email or web address] URL: [email or web address] Date: 02/22/2023 Prepared by: Ivery Quale  Exercises - Seated Ankle Alphabet  - 2 x daily - 6 x weekly - 1 sets - 1-2 reps - Seated Ankle Circles  - 2 x daily - 6 x weekly - 1 sets - 15 reps - Isometric Ankle Inversion  - 2 x daily - 6 x weekly - 2 sets - 10 reps - 5 sec hold - Standing Tandem Balance with Counter Support  - 2 x daily - 6 x weekly - 1 sets - 3 reps - 30 hold - Side Stepping with Counter Support  - 2 x daily - 6 x weekly - 1 sets - 3-5 reps - Heel Toe Raises with Counter Support  - 2 x daily - 6 x weekly - 2 sets - 10 reps  ASSESSMENT:  CLINICAL IMPRESSION:  She asked about walking with SPC vs RW. We did some gait training with cane for this but she lacks balance and coordination to effectively do this at this time without close supervision so would continue to recommend RW for safe ambulation for now.   OBJECTIVE IMPAIRMENTS: decreased activity tolerance, difficulty walking, decreased balance, decreased endurance, decreased mobility, decreased ROM, decreased strength, impaired flexibility, LE use, postural dysfunction, and pain.  ACTIVITY LIMITATIONS: bending, lifting, carry, locomotion, cleaning, community activity, driving  PERSONAL FACTORS: PMH:Dep, OA,S/P TKR Lt 08/2022, DM, gout are also affecting patient's functional outcome.  REHAB POTENTIAL: Fair    CLINICAL DECISION MAKING: Evolving/moderate complexity  EVALUATION COMPLEXITY: Moderate    GOALS: Short term PT Goals Target date: 03/22/2023   Pt will be I and compliant with HEP. Baseline:  Goal status: Met 03/04/2023 Pt will decrease pain by 25% overall Baseline:7  03/28/23: 5/10 at present Goal status: On Going    Long term PT goals Target date:04/19/2023   Pt will improve Rt ankle  AROM to [facility] (10 deg DF and EV) to improve functional mobility Baseline: 03/28/23: see ROM chart above  Goal status: MET    Pt will improve Rt ankle strength to at least 4+/5 in supine MMT to improve functional strength Baseline: 03/28/23: see MMT chart above  Goal status: ONGOING  Pt will improve FOTO to at least 57% functional to show improved function Baseline: 03/28/23: 46   Goal status: ONGOING  Pt will reduce pain by overall 50% (3.5 or less out of 10) overall with usual activity Baseline:7 03/28/23: 5/10 today, 3-5/10 range over past week Goal status: ONGOING  Pt will reduce pain to overall less than 2-3/10 with usual activity and work activity. Baseline: 03/28/23: 5/10 today, 3-5/10 range over past week Goal status: ONGOING  Pt will be able to ambulate community distances at least 750 ft with LRAD Baseline: 03/28/23: continuing to ambulate w/ RW, short distances without AD in home (AD use is encouraged) Goal status: ONGOING  7. Patient will improve TUG time to less than 14 seconds to show improved gait speed and balance 03/28/23: 16 sec RW  Goal status: PROGRESSING   PLAN: updated 03/28/23 PT FREQUENCY: 1-2 times per week    PT DURATION: 3 weeks  PLANNED INTERVENTIONS (unless contraindicated): aquatic PT, Canalith repositioning, cryotherapy, Electrical stimulation, Iontophoresis with 4 mg/ml dexamethasome, Moist heat, traction, Ultrasound, gait training, Therapeutic exercise, balance training, neuromuscular re-education, patient/family education, prosthetic training, manual techniques, passive ROM, dry needling, taping, vasopnuematic device, vestibular, spinal manipulations, joint manipulations  PLAN FOR NEXT SESSION:  knee and Rt ankle strengthening, balance, gait as tolerated  Ivery Quale, PT, DPT 04/11/23 2:28 PM

## 2023-04-14 ENCOUNTER — Ambulatory Visit (INDEPENDENT_AMBULATORY_CARE_PROVIDER_SITE_OTHER): Payer: Medicare HMO | Admitting: Physical Therapy

## 2023-04-14 ENCOUNTER — Encounter: Payer: Self-pay | Admitting: Physical Therapy

## 2023-04-14 DIAGNOSIS — R600 Localized edema: Secondary | ICD-10-CM

## 2023-04-14 DIAGNOSIS — M6281 Muscle weakness (generalized): Secondary | ICD-10-CM | POA: Diagnosis not present

## 2023-04-14 DIAGNOSIS — M25671 Stiffness of right ankle, not elsewhere classified: Secondary | ICD-10-CM

## 2023-04-14 DIAGNOSIS — M25571 Pain in right ankle and joints of right foot: Secondary | ICD-10-CM

## 2023-04-14 DIAGNOSIS — R6889 Other general symptoms and signs: Secondary | ICD-10-CM | POA: Diagnosis not present

## 2023-04-14 DIAGNOSIS — R262 Difficulty in walking, not elsewhere classified: Secondary | ICD-10-CM

## 2023-04-14 NOTE — Therapy (Signed)
OUTPATIENT PHYSICAL THERAPY TREATMENT      Patient Name: Mckenzie Rogers MRN: [id number] DOB:12/13/1940, 82 y.o., female Today's Date: 04/14/2023  END OF SESSION:  PT End of Session - 04/14/23 1359     Visit Number 14    Number of Visits 16    Date for PT Re-Evaluation 04/19/23    Authorization Type [employer]    Authorization - Number of Visits 12    Progress Note Due on Visit 20    PT Start Time 1345    PT Stop Time 1429    PT Time Calculation (min) 44 min    Activity Tolerance Patient tolerated treatment well;No increased pain    Behavior During Therapy WFL for tasks assessed/performed                Past Medical History:  Diagnosis Date   Chronic kidney disease    Complication of anesthesia    "hard time waking me up after foot surgery"   Depression    Diabetes (HCC)    type 2   Edema    Family history of kidney cancer    Family history of lung cancer    Family history of pancreatic cancer    Osteoarthritis of both knees    Sleep apnea    "years ago" does not wear CPAP anymmore   Past Surgical History:  Procedure Laterality Date   ABDOMINAL HYSTERECTOMY     FOOT SURGERY Left    TOTAL KNEE ARTHROPLASTY Left 08/19/2022   Procedure: LEFT TOTAL KNEE ARTHROPLASTY;  Surgeon: Cammy Copa, MD;  Location: [facility] OR;  Service: Orthopedics;  Laterality: Left;   TUBAL LIGATION     Patient Active Problem List   Diagnosis Date Noted   Current moderate episode of major depressive disorder without prior episode (HCC) 01/10/2023   BMI 33.0-33.9,adult 01/10/2023   Primary osteoarthritis of both knees 01/10/2023   Debility 10/05/2022   Gout attack 10/04/2022   UTI (urinary tract infection) 10/04/2022   Arthritis of left knee 08/21/2022   OA (osteoarthritis) of knee 08/19/2022   S/P TKR (total knee replacement), left 08/19/2022   Controlled type 2 diabetes mellitus without complication, without long-term current use of insulin (HCC) 07/31/2021   Genetic testing  03/14/2020   Family history of pancreatic cancer    Family history of kidney cancer    Family history of lung cancer    Long term (current) use of insulin (HCC) 11/01/2018   Chronic kidney disease due to hypertension 08/24/2018   Non-thrombocytopenic purpura (HCC) 01/14/2016   Overactive bladder 01/14/2016   Obstructive sleep apnea (adult) (pediatric) 12/10/2009   Diabetic renal disease (HCC) 05/10/2009   Disorder of bone 05/10/2009   Gastro-esophageal reflux disease without esophagitis 05/10/2009    PCP: Gaspar Garbe, MD   REFERRING PROVIDER: Nadara Mustard, MD  REFERRING DIAG: [phone] (ICD-10-CM) - Arthritis of right ankle Z36.644 (ICD-10-CM) - Insufficiency of right posterior tibial tendon  THERAPY DIAG:  Pain in right ankle and joints of right foot  Stiffness of right ankle, not elsewhere classified  Muscle weakness (generalized)  Difficulty in walking, not elsewhere classified  Localized edema  Rationale for Evaluation and Treatment: Rehabilitation  ONSET DATE: Rt ankle pain since December 2023  SUBJECTIVE:   SUBJECTIVE STATEMENT:  She relays pain is about 5/10 in her knee today   PERTINENT HISTORY:PMH:Dep, OA,S/P TKR Lt 08/2022, DM, Gout  PAIN:  Are you having pain? Yes: NPRS scale: 7/10 ankle upon arrival.  Pain location:  Rt medial ankle, some Rt knee pain as well Pain description: Swelling, tingles, shocking pain Aggravating factors: hurts at night when she lays down, prolonged standing Relieving factors: ankle pumps  PRECAUTIONS: None  WEIGHT BEARING RESTRICTIONS: No  FALLS:  Has patient fallen in last 6 months? 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 No acute abnormality seen.  PATIENT SURVEYS:  Eval: FOTO 48% functional intake, goal is 57% FOTO 03/28/23: 46%   COGNITION: Eval: Overall cognitive status: Within functional limits for tasks assessed       EDEMA:  Moderate edema in Rt ankle and foot present at eval  MUSCLE LENGTH: Eval: Tight heelcords Rt  POSTURE: Rt ankle valgus and R knee valgus noted at eval  PALPATION: Tender to palpation in medial Rt ankle, ankle hypomobility into EV/INV  LOWER EXTREMITY ROM:  Active ROM/PROM Right eval Right 03/14/23 Right 03/28/23  Hip flexion     Hip extension     Hip abduction     Hip adduction     Hip internal rotation     Hip external rotation     Knee flexion     Knee extension     Ankle dorsiflexion 5/ 7/ 10 deg  Ankle plantarflexion 38/    Ankle inversion 0/5 5/ 5 deg  Ankle eversion 5/10 20/ 15 deg   (Blank rows = not tested)  LOWER EXTREMITY MMT:  MMT in supine Right eval Left eval Right 03/28/23  Hip flexion     Hip extension     Hip abduction     Hip adduction     Hip internal rotation     Hip external rotation     Knee flexion     Knee extension     Ankle dorsiflexion 5  5  Ankle plantarflexion (manual) 4+  5  Ankle inversion 3  3+ within available ROM  Ankle eversion 4+  5   (Blank rows = not tested)  LOWER EXTREMITY SPECIAL TESTS:    FUNCTIONAL TESTS:  Eval: Timed up and go (TUG): 20.9 sec with RW  Tandem balance test 4 seconds with Rt leg back, 20 seconds with Lt leg back.  03/28/23: TUG w RW  16.72sec  Tandem balance R back 30sec (first attempt), L back 30 sec (6 sec first attempt). CGA for safety, difficulty achieving full tandem  GAIT: Eval Comments: antalgic gait on Rt, uses RW for now but would like to wean off of this as PLOF was community ambulation without AD   TODAY'S TREATMENT:  04/11/23 Nu step L5 LE only, seat #10 X 6 min Gait training with SPC with cues and demo for proper technique and sequencing.  70 feet X 2 with quad tip cane, 70 feet X 2 with SPC, and showed her how to practice at home by countertop for safety and had her perform 3 round trips of this and she verbalizes understanding Standing gastroc stretch on slantboard 30 sec X 3 Tandem balance 30 sec X 2 bilat Heel and toe raises 2 X 10 with UE support Seated LAQ 3# X 20 bilat Sit to stands with UE support X 5 Seated bilat ankle inversion isometrics 5 sec 2X15 ball squeeze Seated Rt  ankle INV,EV, 2X10 each using RW leg to anchor band and other foot to block RW from moving. Demo and cues to set up at home to make it easer to do this one for HEP, she verbalizes understanding. (more weakness noted with INV and has limited ROM for this Leg press machine DL 73# [id number]  5/42/70 Nu step L5 LE only, seat #10 X 6 min Gait training with SPC with cues and demo for proper technique and sequencing.  Standing gastroc stretch on slantboard 30 sec X 3 Tandem balance 30 sec X 2 bilat Heel and toe raises 2 X 10 with UE support Seated SLR 2X10 bilat Sit to stands with UE support X 5 Seated bilat ankle inversion isometrics 5 sec 2X10 ball squeeze Seated Rt ankle INV,EV, 2X10 each using RW leg to anchor band and other foot to block RW from moving. Demo and cues to set up at home to make it easer to do this one for HEP, she verbalizes understanding. (more weakness noted with INV and has limited ROM for this Step ups on 4 inch step X 10 bilat, with bilat UE support Leg press machine DL 62# [id number]  05/13/50 Nu step L5 LE only, seat #10 X 6 min Standing gastroc stretch on slantboard 30 sec X 3 Tandem balance 30 sec X 2 bilat Heel and toe raises 2 X 10 with UE support Seated LAQ X 20 reps bilat with 2# Seated SLR 2X10 bilat Sit to stands with UE support X 5 Seated bilat ankle inversion isometrics 5 sec 2X10 ball squeeze Seated Rt ankle INV,EV, 2X10 each using RW leg to anchor band and other foot to block RW from moving. Demo and cues to set up at home  to make it easer to do this one for HEP, she verbalizes understanding. (more weakness noted with INV and has limited ROM for this Seated hamstring curls red 2X10 bilat Leg press machine DL 76# [id number]  3/71/06 Nu step L5 LE only, seat #10 X 6 min Standing gastroc stretch on slantboard 30 sec X 3 Tandem balance 30 sec X 2 bilat Heel and toe raises 2 X 10 with UE support Seated LAQ X 20 reps bilat holding 2 sec at top Seated SLR 2X10 bilat Seated bilat ankle inversion isometrics 5 sec 2X10 ball squeeze Seated Rt ankle INV,DF,EV, PF with red 2 X 10 each plane (more weakness noted with INV and has limited ROM for this, doubled up band for PF) Seated hamstring curls red 2X10 bilat Leg press machine DL 26# [id number]     PATIENT EDUCATION: Education details: rationale for interventions, HEP, PT POC Person educated: Patient Education method: Explanation, Demonstration, Verbal cues, and Handouts Education comprehension: verbalized understanding and needs further education   HOME EXERCISE PROGRAM: Access Code: [email or web address] URL: [email or web address] Date: 02/22/2023 Prepared by: Ivery Quale  Exercises - Seated Ankle Alphabet  - 2 x daily - 6 x weekly - 1 sets - 1-2 reps - Seated Ankle Circles  - 2 x daily - 6 x weekly - 1 sets - 15 reps - Isometric Ankle Inversion  - 2 x daily - 6 x weekly - 2 sets - 10 reps - 5 sec hold - Standing Tandem Balance with Counter Support  - 2 x daily - 6 x weekly - 1 sets - 3 reps - 30 hold - Side Stepping with Counter Support  - 2 x daily - 6 x weekly - 1 sets - 3-5 reps - Heel Toe Raises with  Counter Support  - 2 x daily - 6 x weekly - 2 sets - 10 reps  ASSESSMENT:  CLINICAL IMPRESSION:  She showed impromements in walking with cane vs RW which is her goal. She has been limited by more knee pain lately so we are incorporating more knee exercises in and recommended she follow up with MD about this as well.   OBJECTIVE IMPAIRMENTS: decreased activity  tolerance, difficulty walking, decreased balance, decreased endurance, decreased mobility, decreased ROM, decreased strength, impaired flexibility, LE use, postural dysfunction, and pain.  ACTIVITY LIMITATIONS: bending, lifting, carry, locomotion, cleaning, community activity, driving  PERSONAL FACTORS: PMH:Dep, OA,S/P TKR Lt 08/2022, DM, gout are also affecting patient's functional outcome.  REHAB POTENTIAL: Fair    CLINICAL DECISION MAKING: Evolving/moderate complexity  EVALUATION COMPLEXITY: Moderate    GOALS: Short term PT Goals Target date: 03/22/2023   Pt will be I and compliant with HEP. Baseline:  Goal status: Met 03/04/2023 Pt will decrease pain by 25% overall Baseline:7 03/28/23: 5/10 at present Goal status: On Going    Long term PT goals Target date:04/19/2023   Pt will improve Rt ankle AROM to [facility] (10 deg DF and EV) to improve functional mobility Baseline: 03/28/23: see ROM chart above  Goal status: MET    Pt will improve Rt ankle strength to at least 4+/5 in supine MMT to improve functional strength Baseline: 03/28/23: see MMT chart above  Goal status: ONGOING  Pt will improve FOTO to at least 57% functional to show improved function Baseline: 03/28/23: 46   Goal status: ONGOING  Pt will reduce pain by overall 50% (3.5 or less out of 10) overall with usual activity Baseline:7 03/28/23: 5/10 today, 3-5/10 range over past week Goal status: ONGOING  Pt will reduce pain to overall less than 2-3/10 with usual activity and work activity. Baseline: 03/28/23: 5/10 today, 3-5/10 range over past week Goal status: ONGOING  Pt will be able to ambulate community distances at least 750 ft with LRAD Baseline: 03/28/23: continuing to ambulate w/ RW, short distances without AD in home (AD use is encouraged) Goal status: ONGOING  7. Patient will improve TUG time to less than 14 seconds to show improved gait speed and balance 03/28/23: 16 sec RW  Goal status: PROGRESSING    PLAN: updated 03/28/23 PT FREQUENCY: 1-2 times per week    PT DURATION: 3 weeks  PLANNED INTERVENTIONS (unless contraindicated): aquatic PT, Canalith repositioning, cryotherapy, Electrical stimulation, Iontophoresis with 4 mg/ml dexamethasome, Moist heat, traction, Ultrasound, gait training, Therapeutic exercise, balance training, neuromuscular re-education, patient/family education, prosthetic training, manual techniques, passive ROM, dry needling, taping, vasopnuematic device, vestibular, spinal manipulations, joint manipulations  PLAN FOR NEXT SESSION:  knee and Rt ankle strengthening, balance, gait as tolerated  Ivery Quale, PT, DPT 04/14/23 2:14 PM

## 2023-04-18 ENCOUNTER — Ambulatory Visit: Payer: Medicare HMO | Admitting: Physical Therapy

## 2023-04-18 ENCOUNTER — Encounter: Payer: Self-pay | Admitting: Physical Therapy

## 2023-04-18 DIAGNOSIS — M25671 Stiffness of right ankle, not elsewhere classified: Secondary | ICD-10-CM | POA: Diagnosis not present

## 2023-04-18 DIAGNOSIS — R6889 Other general symptoms and signs: Secondary | ICD-10-CM | POA: Diagnosis not present

## 2023-04-18 DIAGNOSIS — M25571 Pain in right ankle and joints of right foot: Secondary | ICD-10-CM

## 2023-04-18 DIAGNOSIS — R600 Localized edema: Secondary | ICD-10-CM | POA: Diagnosis not present

## 2023-04-18 DIAGNOSIS — M6281 Muscle weakness (generalized): Secondary | ICD-10-CM

## 2023-04-18 DIAGNOSIS — R262 Difficulty in walking, not elsewhere classified: Secondary | ICD-10-CM

## 2023-04-18 NOTE — Therapy (Signed)
OUTPATIENT PHYSICAL THERAPY TREATMENT/RE-ASSESSMENT      Patient Name: Mckenzie Rogers MRN: [id number] DOB:May 09, 1941, 82 y.o., female Today's Date: 04/18/2023  END OF SESSION:  PT End of Session - 04/18/23 0936     Visit Number 15    Number of Visits 23    Date for PT Re-Evaluation 05/16/23    Authorization Type [employer]    Progress Note Due on Visit 20    PT Start Time 0933    PT Stop Time 1013    PT Time Calculation (min) 40 min    Activity Tolerance Patient tolerated treatment well;No increased pain    Behavior During Therapy WFL for tasks assessed/performed                 Past Medical History:  Diagnosis Date   Chronic kidney disease    Complication of anesthesia    "hard time waking me up after foot surgery"   Depression    Diabetes (HCC)    type 2   Edema    Family history of kidney cancer    Family history of lung cancer    Family history of pancreatic cancer    Osteoarthritis of both knees    Sleep apnea    "years ago" does not wear CPAP anymmore   Past Surgical History:  Procedure Laterality Date   ABDOMINAL HYSTERECTOMY     FOOT SURGERY Left    TOTAL KNEE ARTHROPLASTY Left 08/19/2022   Procedure: LEFT TOTAL KNEE ARTHROPLASTY;  Surgeon: Cammy Copa, MD;  Location: [facility] OR;  Service: Orthopedics;  Laterality: Left;   TUBAL LIGATION     Patient Active Problem List   Diagnosis Date Noted   Current moderate episode of major depressive disorder without prior episode (HCC) 01/10/2023   BMI 33.0-33.9,adult 01/10/2023   Primary osteoarthritis of both knees 01/10/2023   Debility 10/05/2022   Gout attack 10/04/2022   UTI (urinary tract infection) 10/04/2022   Arthritis of left knee 08/21/2022   OA (osteoarthritis) of knee 08/19/2022   S/P TKR (total knee replacement), left 08/19/2022   Controlled type 2 diabetes mellitus without complication, without long-term current use of insulin (HCC) 07/31/2021   Genetic testing 03/14/2020   Family  history of pancreatic cancer    Family history of kidney cancer    Family history of lung cancer    Long term (current) use of insulin (HCC) 11/01/2018   Chronic kidney disease due to hypertension 08/24/2018   Non-thrombocytopenic purpura (HCC) 01/14/2016   Overactive bladder 01/14/2016   Obstructive sleep apnea (adult) (pediatric) 12/10/2009   Diabetic renal disease (HCC) 05/10/2009   Disorder of bone 05/10/2009   Gastro-esophageal reflux disease without esophagitis 05/10/2009    PCP: Gaspar Garbe, MD   REFERRING PROVIDER: Nadara Mustard, MD  REFERRING DIAG: [phone] (ICD-10-CM) - Arthritis of right ankle [phone] (ICD-10-CM) - Insufficiency of right posterior tibial tendon  THERAPY DIAG:  Pain in right ankle and joints of right foot  Stiffness of right ankle, not elsewhere classified  Muscle weakness (generalized)  Difficulty in walking, not elsewhere classified  Localized edema  Rationale for Evaluation and Treatment: Rehabilitation  ONSET DATE: Rt ankle pain since December 2023  SUBJECTIVE:   SUBJECTIVE STATEMENT:   I'm hurting today I'm having a gout flare, on medicine but I still have flares. I feel like overall PT is helping, I do my HEP too. Using band but its hard for me, I walk with my walker in the community but at  home I don't use it a lot. Feel like I'm still scared to put weight down through that ankle with how many problems I've had with it.    PERTINENT HISTORY:PMH:Dep, OA,S/P TKR Lt 08/2022, DM, Gout  PAIN:  Are you having pain? Yes: NPRS scale: 3/10 ankle upon arrival, 5/10 knee  Pain location: Rt medial ankle, some Rt knee pain as well Pain description: ankle WB, knee just hurts in general Aggravating factors: hurts at night when she lays down, prolonged standing Relieving factors: ankle pumps  PRECAUTIONS: None  WEIGHT BEARING RESTRICTIONS: No  FALLS:  Has patient fallen in last 6 months? 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 No acute abnormality seen.  PATIENT SURVEYS:  Eval: FOTO 48% functional intake, goal is 57% FOTO 03/28/23: 46%  FOTO 04/18/23- 46.5%  COGNITION: Eval: Overall cognitive status: Within functional limits for tasks assessed       EDEMA:  Moderate edema in Rt ankle and foot present at eval  MUSCLE LENGTH: Eval: Tight heelcords Rt  POSTURE: Rt ankle valgus and R knee valgus noted at eval  PALPATION: Tender to palpation in medial Rt ankle, ankle hypomobility into EV/INV  LOWER EXTREMITY ROM:  Active ROM/PROM Right eval Right 03/14/23 Right 03/28/23 Right 04/18/23  Hip flexion      Hip extension      Hip abduction      Hip adduction      Hip internal rotation      Hip external rotation      Knee flexion      Knee extension      Ankle dorsiflexion 5/ 7/ 10 deg 16*  Ankle plantarflexion 38/   51*  Ankle inversion 0/5 5/ 5 deg 16*  Ankle eversion 5/10 20/ 15 deg 15*   (Blank rows = not tested)  LOWER EXTREMITY MMT:  MMT in supine Right eval Left eval Right 03/28/23 Right 04/18/23  Hip flexion      Hip extension      Hip abduction      Hip adduction      Hip internal rotation      Hip external rotation      Knee flexion      Knee extension      Ankle dorsiflexion 5  5 5   Ankle plantarflexion (manual) 4+  5 DNT   Ankle inversion 3  3+ within available ROM 3+ available ROM   Ankle eversion 4+  5 4   (Blank rows = not tested)  LOWER EXTREMITY SPECIAL TESTS:    FUNCTIONAL TESTS:  Eval: Timed up and go (TUG): 20.9 sec with RW  Tandem balance test 4 seconds with Rt leg back, 20 seconds with Lt leg back.  03/28/23: TUG w RW  16.72sec  Tandem balance R back 30sec  (first attempt), L back 30 sec (6 sec first attempt). CGA for safety, difficulty achieving full tandem  04/18/23 TUG 15.3 seconds                 GAIT: Eval Comments: antalgic gait on Rt, uses RW for now but would like to wean off of this as PLOF was community ambulation without AD   TODAY'S TREATMENT:  04/18/23  Objective measures for progress note, FOTO    TherEx  Isometric ankle inversion using ball 10x5 seconds STS with R foot staggered back x10 for increased WB Semi Tandem stance no UEs solid surface SBA 2x30 seconds       04/11/23 Nu step L5 LE only, seat #10 X 6 min Gait training with SPC with cues and demo for proper technique and sequencing. 70 feet X 2 with quad tip cane, 70 feet X 2 with SPC, and showed her how to practice at home by countertop for safety and had her perform 3 round trips of this and she verbalizes understanding Standing gastroc stretch on slantboard 30 sec X 3 Tandem balance 30 sec X 2 bilat Heel and toe raises 2 X 10 with UE support Seated LAQ 3# X 20 bilat Sit to stands with UE support X 5 Seated bilat ankle inversion isometrics 5 sec 2X15 ball squeeze Seated Rt ankle INV,EV, 2X10 each using RW leg to anchor band and other foot to block RW from moving. Demo and cues to set up at home to make it easer to do this one for HEP, she verbalizes understanding. (more weakness noted with INV and has limited ROM for this Leg press machine DL 59# [id number]  7/56/43 Nu step L5 LE only, seat #10 X 6 min Gait training with SPC with cues and demo for proper technique and sequencing.  Standing gastroc stretch on slantboard 30 sec X 3 Tandem balance 30 sec X 2 bilat Heel and toe raises 2 X 10 with UE support Seated SLR 2X10 bilat Sit to stands with UE support X 5 Seated bilat ankle inversion isometrics 5 sec 2X10 ball squeeze Seated Rt ankle INV,EV, 2X10 each using RW leg to anchor band and other foot to block RW from moving. Demo and cues to set up at home to  make it easer to do this one for HEP, she verbalizes understanding. (more weakness noted with INV and has limited ROM for this Step ups on 4 inch step X 10 bilat, with bilat UE support Leg press machine DL 32# [id number]  8/41/66 Nu step L5 LE only, seat #10 X 6 min Standing gastroc stretch on slantboard 30 sec X 3 Tandem balance 30 sec X 2 bilat Heel and toe raises 2 X 10 with UE support Seated LAQ X 20 reps bilat with 2# Seated SLR 2X10 bilat Sit to stands with UE support X 5 Seated bilat ankle inversion isometrics 5 sec 2X10 ball squeeze Seated Rt ankle INV,EV, 2X10 each using RW leg to anchor band and other foot to block RW from moving. Demo and cues to set up at home to make it easer to do this one for HEP, she verbalizes understanding. (more weakness noted with INV and has limited ROM for this Seated hamstring curls red 2X10 bilat Leg press machine DL 06# [id number]  0/10/93 Nu step L5 LE only, seat #10 X 6 min Standing gastroc stretch on slantboard 30 sec X 3 Tandem balance 30 sec X 2 bilat Heel and toe raises 2 X 10 with UE support Seated LAQ X 20 reps bilat holding 2 sec at top Seated SLR 2X10 bilat Seated bilat ankle inversion isometrics 5 sec 2X10 ball squeeze Seated Rt ankle INV,DF,EV, PF with red 2 X 10 each plane (more weakness noted with INV and has limited ROM for this, doubled up band for PF) Seated hamstring curls red 2X10 bilat Leg press machine DL 23# [id number]  PATIENT EDUCATION: Education details: rationale for interventions, HEP, PT POC Person educated: Patient Education method: Explanation, Demonstration, Verbal cues, and Handouts Education comprehension: verbalized understanding and needs further education   HOME EXERCISE PROGRAM: Access Code: [email or web address] URL: [email or web address] Date: 02/22/2023 Prepared by: Ivery Quale  Exercises - Seated Ankle Alphabet  - 2 x daily - 6 x weekly - 1 sets - 1-2 reps - Seated Ankle Circles  - 2 x daily - 6 x  weekly - 1 sets - 15 reps - Isometric Ankle Inversion  - 2 x daily - 6 x weekly - 2 sets - 10 reps - 5 sec hold - Standing Tandem Balance with Counter Support  - 2 x daily - 6 x weekly - 1 sets - 3 reps - 30 hold - Side Stepping with Counter Support  - 2 x daily - 6 x weekly - 1 sets - 3-5 reps - Heel Toe Raises with Counter Support  - 2 x daily - 6 x weekly - 2 sets - 10 reps  ASSESSMENT:  CLINICAL IMPRESSION:   Martrice arrives doing OK, having a  bit of a gout flare today but per objective measures she is definitely making some progress. We will plan to extend POC for an additional few weeks to continue working towards LTGs. See above for today's updated objectives. Lots of education about progress with PT and POC moving forward, importance of exercise and "movement as medicine". Will continue to efforts.   OBJECTIVE IMPAIRMENTS: decreased activity tolerance, difficulty walking, decreased balance, decreased endurance, decreased mobility, decreased ROM, decreased strength, impaired flexibility, LE use, postural dysfunction, and pain.  ACTIVITY LIMITATIONS: bending, lifting, carry, locomotion, cleaning, community activity, driving  PERSONAL FACTORS: PMH:Dep, OA,S/P TKR Lt 08/2022, DM, gout are also affecting patient's functional outcome.  REHAB POTENTIAL: Fair    CLINICAL DECISION MAKING: Evolving/moderate complexity  EVALUATION COMPLEXITY: Moderate    GOALS: Short term PT Goals Target date: 04/30/2023   Pt will be I and compliant with HEP. Baseline:  Goal status: Met 03/04/2023 Pt will decrease pain by 25% overall Baseline:7 03/28/23: 5/10 at present Goal status: On Going   04/18/23  Long term PT goals Target date:05/16/2023   Pt will improve Rt ankle AROM to WFL (10 deg DF and EV) to improve functional mobility Baseline: 03/28/23: see ROM chart above  Goal status: MET    Pt will improve Rt ankle strength to at least 4+/5 in supine MMT to improve functional  strength Baseline: 03/28/23: see MMT chart above  Goal status: ONGOING 04/18/23  Pt will improve FOTO to at least 57% functional to show improved function Baseline: 03/28/23: 46   Goal status: ONGOING 04/18/23  Pt will reduce pain by overall 50% (3.5 or less out of 10) overall with usual activity Baseline:7 03/28/23: 5/10 today, 3-5/10 range over past week Goal status: ONGOING 04/18/23  Pt will reduce pain to overall less than 2-3/10 with usual activity and work activity. Baseline: 03/28/23: 5/10 today, 3-5/10 range over past week Goal status: ONGOING 8//5/24  Pt will be able to ambulate community distances at least 750 ft with LRAD Baseline: 03/28/23: continuing to ambulate w/ RW, short distances without AD in home (AD use is encouraged) Goal status: ONGOING  7. Patient will improve TUG time to less than 14 seconds to show improved gait speed and balance 03/28/23: 16 sec RW  Goal status: PROGRESSING  04/18/23  PLAN: updated 04/18/23 PT FREQUENCY: 2 times per week    PT DURATION: 4 weeks  PLANNED INTERVENTIONS (unless contraindicated): aquatic PT, Canalith repositioning, cryotherapy, Electrical stimulation, Iontophoresis with 4 mg/ml dexamethasome, Moist heat, traction, Ultrasound, gait training, Therapeutic exercise, balance training, neuromuscular re-education, patient/family education, prosthetic training, manual techniques, passive ROM, dry needling, taping, vasopnuematic device, vestibular, spinal manipulations, joint manipulations  PLAN FOR NEXT SESSION:  knee and Rt ankle strengthening, balance, gait as tolerated, HEP updates PRN   Nedra Hai, PT, DPT 04/18/23 10:13 AM

## 2023-04-19 ENCOUNTER — Encounter: Payer: Self-pay | Admitting: Podiatry

## 2023-04-19 ENCOUNTER — Ambulatory Visit (INDEPENDENT_AMBULATORY_CARE_PROVIDER_SITE_OTHER): Payer: Medicare HMO | Admitting: Podiatry

## 2023-04-19 DIAGNOSIS — M79675 Pain in left toe(s): Secondary | ICD-10-CM | POA: Diagnosis not present

## 2023-04-19 DIAGNOSIS — B351 Tinea unguium: Secondary | ICD-10-CM | POA: Diagnosis not present

## 2023-04-19 DIAGNOSIS — M79674 Pain in right toe(s): Secondary | ICD-10-CM | POA: Diagnosis not present

## 2023-04-19 DIAGNOSIS — R6889 Other general symptoms and signs: Secondary | ICD-10-CM | POA: Diagnosis not present

## 2023-04-19 NOTE — Progress Notes (Unsigned)
    Subjective:  Patient ID: Mckenzie Rogers, female    DOB: [birth year],  MRN: [id number]  Mckenzie Rogers presents to clinic today for:  Chief Complaint  Patient presents with   [facility]   Patient notes nails are thick, discolored, elongated and painful in shoegear when trying to ambulate.  She has had the hallux nails removed in the past.  PCP is [name], [name], MD.  Allergies  Allergen Reactions   Aspirin Other (See Comments)    Pt unsure of reaction   Clorazepate Dipotassium Other (See Comments)    "Can't function"   Ketoprofen Other (See Comments)    Pt unsure of reaction   Oxycodone-Aspirin Swelling and Other (See Comments)    Tongue swells      Review of Systems: Negative except as noted in the HPI.  Objective:  There were no vitals filed for this visit.  Mckenzie Rogers is a pleasant 82 y.o. female in NAD. AAO x 3.  Vascular Examination: Capillary refill time is 3-5 seconds to toes bilateral. Palpable pedal pulses b/l LE. Digital hair present b/l. No pedal edema b/l. Skin temperature gradient WNL b/l. No varicosities b/l. No cyanosis or clubbing noted b/l.   Dermatological Examination: Pedal skin with normal turgor, texture and tone b/l. No open wounds. No interdigital macerations b/l. Toenails x 8 are 3mm thick, discolored, dystrophic with subungual debris. There is pain with compression of the nail plates.  They are elongated x 8     Latest Ref Rng & Units 10/04/2022    6:51 PM 08/09/2022    1:42 PM  Hemoglobin A1C  Hemoglobin-A1c 4.8 - 5.6 % 6.3  7.5    Assessment/Plan: 1. Pain due to onychomycosis of toenails of both feet    The mycotic toenails were sharply debrided x 8 with sterile nail nippers and a power debriding burr to decrease bulk/thickness and length.    Follow-up in 3 months.  Will also have the patient get set up with our new pedorthist to have a new consultation for diabetic shoes.  She has went beyond the allowed time for exchanges  for inappropriate fit of shoes.  She does not want to have the same width or shoe style that she had previously since they are too loose and floppy on her feet.   Clerance Lav, DPM, FACFAS [facility]     [address] [place], [place] [address]                Office [phone]  Fax [phone]

## 2023-04-21 ENCOUNTER — Encounter: Payer: Medicare HMO | Admitting: Physical Therapy

## 2023-04-25 ENCOUNTER — Ambulatory Visit: Payer: Medicare HMO

## 2023-04-25 NOTE — Progress Notes (Signed)
Patient presents to the office today for diabetic shoe and insole measuring.  Patient was measured with brannock device to determine size and width for 1 pair of extra depth shoes and foam casted for 3 pair of insoles.   Documentation of medical necessity will be sent to patient's treating diabetic doctor to verify and sign.   Patient's diabetic provider: Richard Tisoverc  Shoes and insoles will be ordered at that time and patient will be notified for an appointment for fitting when they arrive.   Shoe size (per patient): 9.5   Brannock measurement: 9.5  Patient shoe selection-   Shoe choice:   [id number]  Shoe size ordered: 9.33M ABN and financial obtained  Items to be fit when in  [employer], CFo, CFm

## 2023-05-02 ENCOUNTER — Encounter: Payer: Medicare HMO | Admitting: Rehabilitative and Restorative Service Providers"

## 2023-05-05 ENCOUNTER — Encounter: Payer: Medicare HMO | Admitting: Rehabilitative and Restorative Service Providers"

## 2023-05-09 ENCOUNTER — Encounter: Payer: Medicare HMO | Admitting: Rehabilitative and Restorative Service Providers"

## 2023-05-12 ENCOUNTER — Encounter: Payer: Self-pay | Admitting: Emergency Medicine

## 2023-05-12 ENCOUNTER — Encounter: Payer: Medicare HMO | Admitting: Rehabilitative and Restorative Service Providers"

## 2023-05-12 ENCOUNTER — Ambulatory Visit
Admission: EM | Admit: 2023-05-12 | Discharge: 2023-05-12 | Disposition: A | Discharge status: Home / Self Care | Payer: Medicare HMO | Attending: Internal Medicine | Admitting: Internal Medicine

## 2023-05-12 DIAGNOSIS — Z20822 Contact with and (suspected) exposure to covid-19: Secondary | ICD-10-CM | POA: Insufficient documentation

## 2023-05-12 LAB — SARS CORONAVIRUS 2 (TAT 6-24 HRS): SARS Coronavirus 2: NEGATIVE

## 2023-05-12 NOTE — Discharge Instructions (Signed)
COVID test is pending.  Will call if it is positive.

## 2023-05-12 NOTE — ED Triage Notes (Signed)
Pt reports that her nephew home covid test that was positive so pt would like covid test. Reports nose has been running and was dizzy few days ago.

## 2023-05-12 NOTE — ED Provider Notes (Signed)
[facility]    CSN: [id number] Arrival date & time: 05/12/23  0920      History   Chief Complaint Chief Complaint  Patient presents with   Covid Exposure    HPI Mckenzie Rogers is a 82 y.o. female.   Patient presents today for COVID testing as her nephew recently tested positive for COVID-19.  Patient states that she had a runny nose and dizziness a few days ago but is currently asymptomatic.     Past Medical History:  Diagnosis Date   Chronic kidney disease    Complication of anesthesia    "hard time waking me up after foot surgery"   Depression    Diabetes (HCC)    type 2   Edema    Family history of kidney cancer    Family history of lung cancer    Family history of pancreatic cancer    Osteoarthritis of both knees    Sleep apnea    "years ago" does not wear CPAP anymmore    Patient Active Problem List   Diagnosis Date Noted   Current moderate episode of major depressive disorder without prior episode (HCC) 01/10/2023   BMI 33.0-33.9,adult 01/10/2023   Primary osteoarthritis of both knees 01/10/2023   Debility 10/05/2022   Gout attack 10/04/2022   UTI (urinary tract infection) 10/04/2022   Arthritis of left knee 08/21/2022   OA (osteoarthritis) of knee 08/19/2022   S/P TKR (total knee replacement), left 08/19/2022   Controlled type 2 diabetes mellitus without complication, without long-term current use of insulin (HCC) 07/31/2021   Genetic testing 03/14/2020   Family history of pancreatic cancer    Family history of kidney cancer    Family history of lung cancer    Long term (current) use of insulin (HCC) 11/01/2018   Chronic kidney disease due to hypertension 08/24/2018   Non-thrombocytopenic purpura (HCC) 01/14/2016   Overactive bladder 01/14/2016   Obstructive sleep apnea (adult) (pediatric) 12/10/2009   Diabetic renal disease (HCC) 05/10/2009   Disorder of bone 05/10/2009   Gastro-esophageal reflux disease without esophagitis  05/10/2009    Past Surgical History:  Procedure Laterality Date   ABDOMINAL HYSTERECTOMY     FOOT SURGERY Left    TOTAL KNEE ARTHROPLASTY Left 08/19/2022   Procedure: LEFT TOTAL KNEE ARTHROPLASTY;  Surgeon: Cammy Copa, MD;  Location: [facility] OR;  Service: Orthopedics;  Laterality: Left;   TUBAL LIGATION      OB History   No obstetric history on file.      Home Medications    Prior to Admission medications   Medication Sig Start Date End Date Taking? Authorizing Provider  acetaminophen (TYLENOL) 650 MG CR tablet Take 650 mg by mouth every 8 (eight) hours as needed for pain.    [provider]  albuterol (PROVENTIL HFA;VENTOLIN HFA) 108 (90 Base) MCG/ACT inhaler Inhale 1-2 puffs into the lungs every 6 (six) hours as needed for wheezing or shortness of breath. Patient not taking: Reported on 10/06/2022 01/24/18   Couture, Cortni S, PA-C  allopurinol (ZYLOPRIM) 100 MG tablet Take 1 tablet (100 mg total) by mouth 2 (two) times daily. 03/23/23   Adonis Huguenin, NP  atorvastatin (LIPITOR) 80 MG tablet Take 80 mg by mouth at bedtime.    [provider]  bisacodyl (DULCOLAX) 5 MG EC tablet Take 2 tablets (10 mg total) by mouth daily as needed for moderate constipation. 10/11/22   Rai, Delene Ruffini, MD  colchicine 0.6 MG tablet Take  1 tablet (0.6 mg total) by mouth 2 (two) times daily for 5 days. During acute gout attack 10/11/22 10/16/22  Rai, Delene Ruffini, MD  docusate sodium (COLACE) 100 MG capsule Take 1 capsule (100 mg total) by mouth 2 (two) times daily. Patient not taking: Reported on 04/19/2023 08/26/22   Magnant, Joycie Peek, PA-C  escitalopram (LEXAPRO) 20 MG tablet Take 20 mg by mouth daily. 06/26/21   [provider]  FARXIGA 10 MG TABS tablet Take 10 mg by mouth daily. 01/02/23   [provider]  HYDROmorphone (DILAUDID) 2 MG tablet Take 0.5 tablets (1 mg total) by mouth every 6 (six) hours as needed for severe pain. 10/11/22   Rai, Ripudeep K, MD  insulin  aspart (NOVOLOG) 100 UNIT/ML injection Inject 0-9 Units into the skin 3 (three) times daily with meals. Sliding scale CBG 70 - 120: 0 units CBG 121 - 150: 1 unit,  CBG 151 - 200: 2 units,  CBG 201 - 250: 3 units,  CBG 251 - 300: 5 units,  CBG 301 - 350: 7 units,  CBG 351 - 400: 9 units   CBG > 400: 9 units and notify your MD Patient not taking: Reported on 04/19/2023 10/11/22   Rai, Delene Ruffini, MD  Insulin Glargine (BASAGLAR KWIKPEN) 100 UNIT/ML Inject 16 Units into the skin daily. 10/11/22   Rai, Delene Ruffini, MD  methocarbamol (ROBAXIN) 500 MG tablet Take 1 tablet (500 mg total) by mouth every 8 (eight) hours as needed for muscle spasms. 08/26/22   Magnant, Charles L, PA-C  mirtazapine (REMERON) 7.5 MG tablet Take 1 tablet (7.5 mg total) by mouth at bedtime. 10/11/22   Rai, Ripudeep K, MD  MYRBETRIQ 25 MG TB24 tablet Take 25 mg by mouth daily. 03/04/21   [provider]  polyethylene glycol (MIRALAX / GLYCOLAX) 17 g packet Take 17 g by mouth daily as needed. 10/11/22   Rai, Delene Ruffini, MD  predniSONE (DELTASONE) 10 MG tablet Take 30mg  PO daily for 1 day, then 20mg  daily x 3 days, then 10mg  daily for 3 days, then OFF 10/11/22   Rai, Ripudeep K, MD  TOVIAZ 4 MG TB24 tablet Take 4 mg by mouth daily. 07/17/18   [provider]    Family History Family History  Problem Relation Age of Onset   Diabetes Maternal Aunt    CVA Daughter    Kidney failure Son    Pancreatic cancer Father 21   Kidney cancer Half-Sister 25       non-smoker   Lung cancer Half-Sister 15       hx of smoking   Cancer Half-Sister 25       unknown primary    Social History Social History   Tobacco Use   Smoking status: Never   Smokeless tobacco: Never  Vaping Use   Vaping status: Never Used  Substance Use Topics   Alcohol use: Never   Drug use: Never     Allergies   Aspirin, Clorazepate dipotassium, Ketoprofen, and Oxycodone-aspirin   Review of Systems Review of Systems Per HPI  Physical  Exam Triage Vital Signs ED Triage Vitals  Encounter Vitals Group     BP 05/12/23 0945 (!) 98/54     Systolic BP Percentile --      Diastolic BP Percentile --      Pulse Rate 05/12/23 0945 68     Resp 05/12/23 0945 20     Temp 05/12/23 0945 98.1 F (36.7 C)  Temp Source 05/12/23 0945 Oral     SpO2 05/12/23 0945 98 %     Weight --      Height --      Head Circumference --      Peak Flow --      Pain Score 05/12/23 0944 0     Pain Loc --      Pain Education --      Exclude from Growth Chart --    No data found.  Updated Vital Signs BP 131/64 (BP Location: Left Arm)   Pulse 68   Temp 98.1 F (36.7 C) (Oral)   Resp 20   LMP  (LMP Unknown)   SpO2 98%   Visual Acuity Right Eye Distance:   Left Eye Distance:   Bilateral Distance:    Right Eye Near:   Left Eye Near:    Bilateral Near:     Physical Exam Constitutional:      General: She is not in acute distress.    Appearance: Normal appearance. She is not toxic-appearing or diaphoretic.  HENT:     Head: Normocephalic and atraumatic.     Right Ear: Tympanic membrane and ear canal normal.     Left Ear: Tympanic membrane and ear canal normal.     Nose: Nose normal.     Mouth/Throat:     Mouth: Mucous membranes are moist.     Pharynx: No posterior oropharyngeal erythema.  Eyes:     Extraocular Movements: Extraocular movements intact.     Conjunctiva/sclera: Conjunctivae normal.     Pupils: Pupils are equal, round, and reactive to light.  Cardiovascular:     Rate and Rhythm: Normal rate and regular rhythm.     Pulses: Normal pulses.     Heart sounds: Normal heart sounds.  Pulmonary:     Effort: Pulmonary effort is normal. No respiratory distress.     Breath sounds: Normal breath sounds.  Neurological:     General: No focal deficit present.     Mental Status: She is alert and oriented to person, place, and time. Mental status is at baseline.     Cranial Nerves: Cranial nerves 2-12 are intact.     Sensory:  Sensation is intact.     Motor: Motor function is intact.     Coordination: Coordination is intact.     Gait: Gait is intact.  Psychiatric:        Mood and Affect: Mood normal.        Behavior: Behavior normal.        Thought Content: Thought content normal.        Judgment: Judgment normal.      UC Treatments / Results  Labs (all labs ordered are listed, but only abnormal results are displayed) Labs Reviewed  SARS CORONAVIRUS 2 (TAT 6-24 HRS)    EKG   Radiology No results found.  Procedures Procedures (including critical care time)  Medications Ordered in UC Medications - No data to display  Initial Impression / Assessment and Plan / UC Course  I have reviewed the triage vital signs and the nursing notes.  Pertinent labs & imaging results that were available during my care of the patient were reviewed by me and considered in my medical decision making (see chart for details).     Patient requesting COVID test given exposure.  She is currently asymptomatic.  Physical exam benign.  Advised strict return precautions.  Patient declined BMP to check GFR in case antivirals are  necessary.  Patient verbalized understanding and was agreeable with plan. Final Clinical Impressions(s) / UC Diagnoses   Final diagnoses:  Encounter for laboratory testing for COVID-19 virus  Close exposure to COVID-19 virus     Discharge Instructions      COVID test is pending. Will call if it is positive.     ED Prescriptions   None    PDMP not reviewed this encounter.   Gustavus Bryant, [place] 05/12/23 [phone]

## 2023-05-17 ENCOUNTER — Encounter: Payer: Medicare HMO | Admitting: Physical Therapy

## 2023-05-19 ENCOUNTER — Encounter: Payer: Self-pay | Admitting: Physical Therapy

## 2023-05-19 ENCOUNTER — Ambulatory Visit (INDEPENDENT_AMBULATORY_CARE_PROVIDER_SITE_OTHER): Payer: Medicare HMO | Admitting: Physical Therapy

## 2023-05-19 DIAGNOSIS — M25671 Stiffness of right ankle, not elsewhere classified: Secondary | ICD-10-CM

## 2023-05-19 DIAGNOSIS — R600 Localized edema: Secondary | ICD-10-CM | POA: Diagnosis not present

## 2023-05-19 DIAGNOSIS — M6281 Muscle weakness (generalized): Secondary | ICD-10-CM

## 2023-05-19 DIAGNOSIS — M25571 Pain in right ankle and joints of right foot: Secondary | ICD-10-CM | POA: Diagnosis not present

## 2023-05-19 DIAGNOSIS — R262 Difficulty in walking, not elsewhere classified: Secondary | ICD-10-CM

## 2023-05-19 NOTE — Therapy (Signed)
OUTPATIENT PHYSICAL THERAPY TREATMENT/Recert/[employer] reauth   Referring diagnosis? M19.071 Treatment diagnosis? (if different than referring diagnosis) M25.571 What was this (referring dx) caused by? []  Surgery []  Fall [x]  Ongoing issue [x]  Arthritis []  Other: ____________   Laterality: [x]  Rt []  Lt []  Both   Check all possible CPT codes:             *CHOOSE 10 OR LESS*                          [x]  97110 (Therapeutic Exercise)             []  92507 (SLP Treatment)  [x]  97112 (Neuro Re-ed)                           []  92526 (Swallowing Treatment)             [x]  97116 (Gait Training)                           []  [id number] (Cognitive Training, 1st 15 minutes) [x]  97140 (Manual Therapy)                                []  97130 (Cognitive Training, each add'l 15 minutes)   []  97164 (Re-evaluation)                              []  Other, List CPT Code ____________  [x]  97530 (Therapeutic Activities)                                    []  97535 (Self Care)                      []  All codes above (97110 - 97535)            []  97012 (Mechanical Traction)            [x]  97014 (E-stim Unattended)            []  97032 (E-stim manual)            [x]  97033 (Ionto)            [x]  97035 (Ultrasound) []  97750 (Physical Performance Training) []  [id number] (Aquatic Therapy) []  97016 (Vasopneumatic Device) []  [id number] (Paraffin) []  97034 (Contrast Bath) []  97597 (Wound Care 1st 20 sq cm) []  97598 (Wound Care each add'l 20 sq cm) []  97760 (Orthotic Fabrication, Fitting, Training Initial) []  [id number] (Prosthetic Management and Training Initial) []  [id number] (Orthotic or Prosthetic Training/ Modification Subsequent)   Patient Name: ANGELICAMARIA TAO MRN: [id number] DOB:Feb 06, 1941, 82 y.o., female Today's Date: 05/19/2023  END OF SESSION:  PT End of Session - 05/19/23 1026     Visit Number 16    Number of Visits 23    Date for PT Re-Evaluation 07/14/23    Authorization Type [employer]    Progress Note Due on Visit  20    PT Start Time 1015    PT Stop Time 1055    PT Time Calculation (min) 40 min    Activity Tolerance Patient tolerated treatment well;No increased pain    Behavior During Therapy [facility] for tasks assessed/performed  Past Medical History:  Diagnosis Date   Chronic kidney disease    Complication of anesthesia    "hard time waking me up after foot surgery"   Depression    Diabetes (HCC)    type 2   Edema    Family history of kidney cancer    Family history of lung cancer    Family history of pancreatic cancer    Osteoarthritis of both knees    Sleep apnea    "years ago" does not wear CPAP anymmore   Past Surgical History:  Procedure Laterality Date   ABDOMINAL HYSTERECTOMY     FOOT SURGERY Left    TOTAL KNEE ARTHROPLASTY Left 08/19/2022   Procedure: LEFT TOTAL KNEE ARTHROPLASTY;  Surgeon: Cammy Copa, MD;  Location: [facility] OR;  Service: Orthopedics;  Laterality: Left;   TUBAL LIGATION     Patient Active Problem List   Diagnosis Date Noted   Current moderate episode of major depressive disorder without prior episode (HCC) 01/10/2023   BMI 33.0-33.9,adult 01/10/2023   Primary osteoarthritis of both knees 01/10/2023   Debility 10/05/2022   Gout attack 10/04/2022   UTI (urinary tract infection) 10/04/2022   Arthritis of left knee 08/21/2022   OA (osteoarthritis) of knee 08/19/2022   S/P TKR (total knee replacement), left 08/19/2022   Controlled type 2 diabetes mellitus without complication, without long-term current use of insulin (HCC) 07/31/2021   Genetic testing 03/14/2020   Family history of pancreatic cancer    Family history of kidney cancer    Family history of lung cancer    Long term (current) use of insulin (HCC) 11/01/2018   Chronic kidney disease due to hypertension 08/24/2018   Non-thrombocytopenic purpura (HCC) 01/14/2016   Overactive bladder 01/14/2016   Obstructive sleep apnea (adult) (pediatric) 12/10/2009   Diabetic renal  disease (HCC) 05/10/2009   Disorder of bone 05/10/2009   Gastro-esophageal reflux disease without esophagitis 05/10/2009    PCP: Gaspar Garbe, MD   REFERRING PROVIDER: Nadara Mustard, MD  REFERRING DIAG: [phone] (ICD-10-CM) - Arthritis of right ankle [phone] (ICD-10-CM) - Insufficiency of right posterior tibial tendon  THERAPY DIAG:  Pain in right ankle and joints of right foot  Stiffness of right ankle, not elsewhere classified  Muscle weakness (generalized)  Difficulty in walking, not elsewhere classified  Localized edema  Rationale for Evaluation and Treatment: Rehabilitation  ONSET DATE: Rt ankle pain since December 2023  SUBJECTIVE:   SUBJECTIVE STATEMENT:  She relays pain is about overall 5/10 in ankle and knee, she has been sick and exposed to covid so she has not been able to attend PT.   PERTINENT HISTORY:PMH:Dep, OA,S/P TKR Lt 08/2022, DM, Gout  PAIN:  Are you having pain? Yes: NPRS scale: 5/10 Rt knee and Rt ankle Pain location: Rt medial ankle, some Rt knee pain as well Pain description: ankle WB, knee just hurts in general Aggravating factors: hurts at night when she lays down, prolonged standing Relieving factors: ankle pumps  PRECAUTIONS: None  WEIGHT BEARING RESTRICTIONS: No  FALLS:  Has patient fallen in last 6 months? 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 No acute abnormality seen.  PATIENT SURVEYS:  Eval: FOTO 48% functional  intake, goal is 57% FOTO 03/28/23: 46%  FOTO 04/18/23- 46.5%  COGNITION: Eval: Overall cognitive status: Within functional limits for tasks assessed       EDEMA:  Moderate edema in Rt ankle and  foot present at eval  MUSCLE LENGTH: Eval: Tight heelcords Rt  POSTURE: Rt ankle valgus and R knee valgus noted at eval  PALPATION: Tender to palpation in medial Rt ankle, ankle hypomobility into EV/INV  LOWER EXTREMITY ROM:  Active ROM/PROM Right eval Right 03/14/23 Right 03/28/23 Right 04/18/23  Hip flexion      Hip extension      Hip abduction      Hip adduction      Hip internal rotation      Hip external rotation      Knee flexion      Knee extension      Ankle dorsiflexion 5/ 7/ 10 deg 16*  Ankle plantarflexion 38/   51*  Ankle inversion 0/5 5/ 5 deg 16*  Ankle eversion 5/10 20/ 15 deg 15*   (Blank rows = not tested)  LOWER EXTREMITY MMT:  MMT in supine Right eval Left eval Right 03/28/23 Right 04/18/23  Hip flexion      Hip extension      Hip abduction      Hip adduction      Hip internal rotation      Hip external rotation      Knee flexion      Knee extension      Ankle dorsiflexion 5  5 5   Ankle plantarflexion (manual) 4+  5 DNT   Ankle inversion 3  3+ within available ROM 3+ available ROM   Ankle eversion 4+  5 4   (Blank rows = not tested)  LOWER EXTREMITY SPECIAL TESTS:    FUNCTIONAL TESTS:  Eval: Timed up and go (TUG): 20.9 sec with RW  Tandem balance test 4 seconds with Rt leg back, 20 seconds with Lt leg back.  03/28/23: TUG w RW  16.72sec  Tandem balance R back 30sec (first attempt), L back 30 sec (6 sec first attempt). CGA for safety, difficulty achieving full tandem  04/18/23 TUG 15.3 seconds   05/19/23 TUG with RW 16.9 sec Tandem balance modified as she cannot bring her legs fully together but is able to hold this 20 sec Max tolerated walking with RW, 200 feet               GAIT: Eval Comments: antalgic gait on Rt, uses RW for now but would like to wean off of this as PLOF was community ambulation without AD   TODAY'S TREATMENT:  05/19/23 Nu step L5 LE only, seat #10 X 6 min Standing gastroc stretch on slantboard 30 sec X 3 Tandem  balance 30 sec X 2 bilat Heel and toe raises 2 X 10 with UE support Seated LAQ 4# X 2X10 bilat Sit to stands with UE support X 5 Seated Rt ankle INV,EV, X20 each each using RW leg to anchor band and other foot to block RW from moving. She again needed Demo and cues to set up at home to make it easer to do this one for HEP Leg press machine DL 16# 1W96 Updated functional tests, TUG and tandem balance, see above for details  04/18/23  Objective measures for progress note, FOTO    TherEx  Isometric ankle inversion using ball 10x5 seconds STS with R foot staggered back x10 for increased WB Semi Tandem stance no UEs  solid surface SBA 2x30 seconds     PATIENT EDUCATION: Education details: rationale for interventions, HEP, PT POC Person educated: Patient Education method: Explanation, Demonstration, Verbal cues, and Handouts Education comprehension: verbalized understanding and needs further education   HOME EXERCISE PROGRAM: Access Code: [email or web address] URL: [email or web address] Date: 02/22/2023 Prepared by: Ivery Quale  Exercises - Seated Ankle Alphabet  - 2 x daily - 6 x weekly - 1 sets - 1-2 reps - Seated Ankle Circles  - 2 x daily - 6 x weekly - 1 sets - 15 reps - Isometric Ankle Inversion  - 2 x daily - 6 x weekly - 2 sets - 10 reps - 5 sec hold - Standing Tandem Balance with Counter Support  - 2 x daily - 6 x weekly - 1 sets - 3 reps - 30 hold - Side Stepping with Counter Support  - 2 x daily - 6 x weekly - 1 sets - 3-5 reps - Heel Toe Raises with Counter Support  - 2 x daily - 6 x weekly - 2 sets - 10 reps  ASSESSMENT:  CLINICAL IMPRESSION:  She has been sick and exposed to COVID so she has not been able to attend PT since 04/18/23. As a result she did decline some with her functional testing and has not yet met her PT goals. PT recommending recert to extend therapy plan to improve function and work toward functional goals. She has however showed good progress  overall before missing the last 30 days.   OBJECTIVE IMPAIRMENTS: decreased activity tolerance, difficulty walking, decreased balance, decreased endurance, decreased mobility, decreased ROM, decreased strength, impaired flexibility, LE use, postural dysfunction, and pain.  ACTIVITY LIMITATIONS: bending, lifting, carry, locomotion, cleaning, community activity, driving  PERSONAL FACTORS: PMH:Dep, OA,S/P TKR Lt 08/2022, DM, gout are also affecting patient's functional outcome.  REHAB POTENTIAL: Fair    CLINICAL DECISION MAKING: Evolving/moderate complexity  EVALUATION COMPLEXITY: Moderate    GOALS: Short term PT Goals Target date: 04/30/2023   Pt will be I and compliant with HEP. Baseline:  Goal status: Met 03/04/2023 Pt will decrease pain by 25% overall Baseline:7 03/28/23: 5/10 at present Goal status: On Going   05/19/23  Long term PT goals Target date:05/16/2023   Pt will improve Rt ankle AROM to WFL (10 deg DF and EV) to improve functional mobility Baseline: 03/28/23: see ROM chart above  Goal status: MET    Pt will improve Rt ankle strength to at least 4+/5 in supine MMT to improve functional strength Baseline: 03/28/23: see MMT chart above  Goal status: ONGOING 05/19/23  Pt will improve FOTO to at least 57% functional to show improved function Baseline: 03/28/23: 46   Goal status: ONGOING 04/18/23  Pt will reduce pain by overall 50% (3.5 or less out of 10) overall with usual activity Baseline:7 03/28/23: 5/10 today, 3-5/10 range over past week Goal status: ONGOING 05/19/23  Pt will reduce pain to overall less than 2-3/10 with usual activity and work activity. Baseline: 03/28/23: 5/10 today, 3-5/10 range over past week Goal status: ONGOING 05/19/23  Pt will be able to ambulate community distances at least 750 ft with LRAD Baseline: 03/28/23: continuing to ambulate w/ RW, short distances without AD in home (AD use is encouraged) Goal status: ONGOING 05/19/23, about 200 feet  currently  7. Patient will improve TUG time to less than 14 seconds to show improved gait speed and balance 03/28/23: 16 sec RW  Goal status: ongoing  05/19/23  PLAN: updated 05/19/23 PT FREQUENCY:  2 times per week    PT DURATION: 8 weeks  PLANNED INTERVENTIONS (unless contraindicated): aquatic PT, Canalith repositioning, cryotherapy, Electrical stimulation, Iontophoresis with 4 mg/ml dexamethasome, Moist heat, traction, Ultrasound, gait training, Therapeutic exercise, balance training, neuromuscular re-education, patient/family education, prosthetic training, manual techniques, passive ROM, dry needling, taping, vasopnuematic device, vestibular, spinal manipulations, joint manipulations  PLAN FOR NEXT SESSION:  knee and Rt ankle strengthening, balance, gait as tolerated, HEP updates PRN   Ivery Quale, PT, DPT 05/19/23 10:48 AM

## 2023-05-24 ENCOUNTER — Encounter: Payer: Medicare HMO | Admitting: Physical Therapy

## 2023-05-26 ENCOUNTER — Ambulatory Visit (INDEPENDENT_AMBULATORY_CARE_PROVIDER_SITE_OTHER): Payer: Medicare HMO | Admitting: Physical Therapy

## 2023-05-26 ENCOUNTER — Telehealth: Payer: Self-pay | Admitting: Podiatry

## 2023-05-26 ENCOUNTER — Encounter: Payer: Self-pay | Admitting: Physical Therapy

## 2023-05-26 DIAGNOSIS — M25571 Pain in right ankle and joints of right foot: Secondary | ICD-10-CM

## 2023-05-26 DIAGNOSIS — R600 Localized edema: Secondary | ICD-10-CM

## 2023-05-26 DIAGNOSIS — M25671 Stiffness of right ankle, not elsewhere classified: Secondary | ICD-10-CM | POA: Diagnosis not present

## 2023-05-26 DIAGNOSIS — R262 Difficulty in walking, not elsewhere classified: Secondary | ICD-10-CM | POA: Diagnosis not present

## 2023-05-26 DIAGNOSIS — M6281 Muscle weakness (generalized): Secondary | ICD-10-CM | POA: Diagnosis not present

## 2023-05-26 NOTE — Therapy (Addendum)
OUTPATIENT PHYSICAL THERAPY TREATMENT  / DISCHARGE      Patient Name: Mckenzie Rogers MRN: [id number] DOB:Sep 17, 1940, 82 y.o., female Today's Date: 05/26/2023  END OF SESSION:  PT End of Session - 05/26/23 0940     Visit Number 17    Number of Visits 23    Date for PT Re-Evaluation 07/14/23    Authorization Type [employer]    Progress Note Due on Visit 20    PT Start Time 0932    PT Stop Time 1010    PT Time Calculation (min) 38 min    Activity Tolerance Patient tolerated treatment well;No increased pain    Behavior During Therapy WFL for tasks assessed/performed                  Past Medical History:  Diagnosis Date   Chronic kidney disease    Complication of anesthesia    "hard time waking me up after foot surgery"   Depression    Diabetes (HCC)    type 2   Edema    Family history of kidney cancer    Family history of lung cancer    Family history of pancreatic cancer    Osteoarthritis of both knees    Sleep apnea    "years ago" does not wear CPAP anymmore   Past Surgical History:  Procedure Laterality Date   ABDOMINAL HYSTERECTOMY     FOOT SURGERY Left    TOTAL KNEE ARTHROPLASTY Left 08/19/2022   Procedure: LEFT TOTAL KNEE ARTHROPLASTY;  Surgeon: Cammy Copa, MD;  Location: [facility] OR;  Service: Orthopedics;  Laterality: Left;   TUBAL LIGATION     Patient Active Problem List   Diagnosis Date Noted   Current moderate episode of major depressive disorder without prior episode (HCC) 01/10/2023   BMI 33.0-33.9,adult 01/10/2023   Primary osteoarthritis of both knees 01/10/2023   Debility 10/05/2022   Gout attack 10/04/2022   UTI (urinary tract infection) 10/04/2022   Arthritis of left knee 08/21/2022   OA (osteoarthritis) of knee 08/19/2022   S/P TKR (total knee replacement), left 08/19/2022   Controlled type 2 diabetes mellitus without complication, without long-term current use of insulin (HCC) 07/31/2021   Genetic testing 03/14/2020   Family  history of pancreatic cancer    Family history of kidney cancer    Family history of lung cancer    Long term (current) use of insulin (HCC) 11/01/2018   Chronic kidney disease due to hypertension 08/24/2018   Non-thrombocytopenic purpura (HCC) 01/14/2016   Overactive bladder 01/14/2016   Obstructive sleep apnea (adult) (pediatric) 12/10/2009   Diabetic renal disease (HCC) 05/10/2009   Disorder of bone 05/10/2009   Gastro-esophageal reflux disease without esophagitis 05/10/2009    PCP: Gaspar Garbe, MD   REFERRING PROVIDER: Nadara Mustard, MD  REFERRING DIAG: [phone] (ICD-10-CM) - Arthritis of right ankle B14.782 (ICD-10-CM) - Insufficiency of right posterior tibial tendon  THERAPY DIAG:  Pain in right ankle and joints of right foot  Stiffness of right ankle, not elsewhere classified  Muscle weakness (generalized)  Difficulty in walking, not elsewhere classified  Localized edema  Rationale for Evaluation and Treatment: Rehabilitation  ONSET DATE: Rt ankle pain since December 2023  SUBJECTIVE:   SUBJECTIVE STATEMENT:   Hurting today, gout flare up. Feeling it in hips and knee on the R a lot today. Taking medicine, I think I need to make an appt since I'm still hurting.   PERTINENT HISTORY:PMH:Dep, OA,S/P TKR Lt 08/2022, DM,  Gout  PAIN:  Are you having pain? Yes: NPRS scale: 7/10 Rt knee and Rt ankle Pain location: Rt medial ankle, some Rt knee pain as well Pain description: gout flare-up  Aggravating factors: WB but really hurts all the time Relieving factors: exercising ankle   PRECAUTIONS: None  WEIGHT BEARING RESTRICTIONS: No  FALLS:  Has patient fallen in last 6 months? 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 No acute abnormality seen.  PATIENT SURVEYS:  Eval: FOTO 48% functional intake, goal is 57% FOTO 03/28/23: 46%  FOTO 04/18/23- 46.5%  COGNITION: Eval: Overall cognitive status: Within functional limits for tasks assessed       EDEMA:  Moderate edema in Rt ankle and foot present at eval  MUSCLE LENGTH: Eval: Tight heelcords Rt  POSTURE: Rt ankle valgus and R knee valgus noted at eval  PALPATION: Tender to palpation in medial Rt ankle, ankle hypomobility into EV/INV  LOWER EXTREMITY ROM:  Active ROM/PROM Right eval Right 03/14/23 Right 03/28/23 Right 04/18/23  Hip flexion      Hip extension      Hip abduction      Hip adduction      Hip internal rotation      Hip external rotation      Knee flexion      Knee extension      Ankle dorsiflexion 5/ 7/ 10 deg 16*  Ankle plantarflexion 38/   51*  Ankle inversion 0/5 5/ 5 deg 16*  Ankle eversion 5/10 20/ 15 deg 15*   (Blank rows = not tested)  LOWER EXTREMITY MMT:  MMT in supine Right eval Left eval Right 03/28/23 Right 04/18/23  Hip flexion      Hip extension      Hip abduction      Hip adduction      Hip internal rotation      Hip external rotation      Knee flexion      Knee extension      Ankle dorsiflexion 5  5 5   Ankle plantarflexion (manual) 4+  5 DNT   Ankle inversion 3  3+ within available ROM 3+ available ROM   Ankle eversion 4+  5 4   (Blank rows = not tested)  LOWER EXTREMITY SPECIAL TESTS:    FUNCTIONAL TESTS:  Eval: Timed up and go (TUG): 20.9 sec with RW  Tandem balance test 4 seconds with Rt leg back, 20 seconds with Lt leg back.  03/28/23: TUG w RW  16.72sec  Tandem balance R back 30sec (first attempt), L back 30 sec (6 sec first attempt). CGA for safety, difficulty achieving full tandem  04/18/23 TUG 15.3 seconds   05/19/23 TUG with RW 16.9 sec Tandem balance modified as she cannot bring her legs fully together but is  able to hold this 20 sec Max tolerated walking with RW, 200 feet               GAIT: Eval Comments: antalgic gait on Rt, uses RW for now but would like to wean off of this as PLOF was community ambulation without AD   TODAY'S TREATMENT:   05/26/23  TherEx  Nustep L4 (less resistance due to  increased pain levels today) x10 minutes  Ankle alphabets x3 for mobility and tissue perfusion LAQs 4# x15  Standing heel and toe raises x12 each Gastroc stretches sitting 3x30 seconds Adjusted RW height to promote better posture/less leaning over device    05/19/23 Nu step L5 LE only, seat #10 X 6 min Standing gastroc stretch on slantboard 30 sec X 3 Tandem balance 30 sec X 2 bilat Heel and toe raises 2 X 10 with UE support Seated LAQ 4# X 2X10 bilat Sit to stands with UE support X 5 Seated Rt ankle INV,EV, X20 each each using RW leg to anchor band and other foot to block RW from moving. She again needed Demo and cues to set up at home to make it easer to do this one for HEP Leg press machine DL 16# 1W96 Updated functional tests, TUG and tandem balance, see above for details  04/18/23  Objective measures for progress note, FOTO    TherEx  Isometric ankle inversion using ball 10x5 seconds STS with R foot staggered back x10 for increased WB Semi Tandem stance no UEs solid surface SBA 2x30 seconds     PATIENT EDUCATION: Education details: rationale for interventions, HEP, PT POC Person educated: Patient Education method: Programmer, multimedia, Demonstration, Verbal cues, and Handouts Education comprehension: verbalized understanding and needs further education   HOME EXERCISE PROGRAM: Access Code: [email or web address] URL: [email or web address] Date: 02/22/2023 Prepared by: Ivery Quale  Exercises - Seated Ankle Alphabet  - 2 x daily - 6 x weekly - 1 sets - 1-2 reps - Seated Ankle Circles  - 2 x daily - 6 x weekly - 1 sets - 15 reps - Isometric Ankle Inversion  - 2 x daily - 6 x  weekly - 2 sets - 10 reps - 5 sec hold - Standing Tandem Balance with Counter Support  - 2 x daily - 6 x weekly - 1 sets - 3 reps - 30 hold - Side Stepping with Counter Support  - 2 x daily - 6 x weekly - 1 sets - 3-5 reps - Heel Toe Raises with Counter Support  - 2 x daily - 6 x weekly - 2 sets - 10 reps  ASSESSMENT:  CLINICAL IMPRESSION:   Mckenzie Rogers arrives today doing OK, she feels that gout flare is causing significant pain today. We worked on exercise and functional activity as pain levels allowed this session with modifications PRN. Will continue efforts. Pain improved in ankle at EOS, knee still painful.   OBJECTIVE IMPAIRMENTS: decreased activity tolerance, difficulty walking, decreased balance, decreased endurance, decreased mobility, decreased ROM, decreased strength, impaired flexibility, LE use, postural dysfunction, and pain.  ACTIVITY LIMITATIONS: bending, lifting, carry, locomotion, cleaning, community activity, driving  PERSONAL FACTORS: PMH:Dep, OA,S/P TKR Lt 08/2022, DM, gout are also affecting patient's functional outcome.  REHAB POTENTIAL: Fair    CLINICAL DECISION MAKING: Evolving/moderate complexity  EVALUATION COMPLEXITY: Moderate    GOALS: Short term PT Goals Target date: 04/30/2023   Pt will be I and compliant with HEP. Baseline:  Goal status: Met 03/04/2023 Pt will decrease pain by 25% overall Baseline:7 03/28/23: 5/10 at present Goal status: On Going   05/19/23  Long term PT goals Target date:05/16/2023   Pt will improve Rt ankle AROM to WFL (10 deg DF and EV) to improve functional mobility Baseline: 03/28/23: see ROM chart above  Goal status: MET    Pt will improve Rt ankle strength to at least 4+/5 in supine MMT to improve functional strength Baseline:  03/28/23: see MMT chart above  Goal status: ONGOING 05/19/23  Pt will improve FOTO to at least 57% functional to show improved function Baseline: 03/28/23: 46   Goal status: ONGOING  04/18/23  Pt will reduce pain by overall 50% (3.5 or less out of 10) overall with usual activity Baseline:7 03/28/23: 5/10 today, 3-5/10 range over past week Goal status: ONGOING 05/19/23  Pt will reduce pain to overall less than 2-3/10 with usual activity and work activity. Baseline: 03/28/23: 5/10 today, 3-5/10 range over past week Goal status: ONGOING 05/19/23  Pt will be able to ambulate community distances at least 750 ft with LRAD Baseline: 03/28/23: continuing to ambulate w/ RW, short distances without AD in home (AD use is encouraged) Goal status: ONGOING 05/19/23, about 200 feet currently  7. Patient will improve TUG time to less than 14 seconds to show improved gait speed and balance 03/28/23: 16 sec RW  Goal status: ongoing  05/19/23  PLAN: updated 05/19/23 PT FREQUENCY: 2 times per week    PT DURATION: 8 weeks  PLANNED INTERVENTIONS (unless contraindicated): aquatic PT, Canalith repositioning, cryotherapy, Electrical stimulation, Iontophoresis with 4 mg/ml dexamethasome, Moist heat, traction, Ultrasound, gait training, Therapeutic exercise, balance training, neuromuscular re-education, patient/family education, prosthetic training, manual techniques, passive ROM, dry needling, taping, vasopnuematic device, vestibular, spinal manipulations, joint manipulations  PLAN FOR NEXT SESSION:  knee and Rt ankle strengthening, balance, gait as tolerated, HEP updates PRN, how is gout pain?   Nedra Hai, PT, DPT 05/26/23 10:10 AM   PHYSICAL THERAPY DISCHARGE SUMMARY  Visits from Start of Care: 17  Current functional level related to goals / functional outcomes: See note   Remaining deficits: See note   Education / Equipment: HEP  Patient goals were  mostly met . Patient is being discharged due to not returning since the last visit.  Chyrel Masson, PT, DPT, OCS, ATC 07/13/23  10:38 AM

## 2023-05-26 NOTE — Telephone Encounter (Signed)
Pt called checking on status of diabetic shoes and upon checking safestep we onlly got the chart notes from the pcp. I told pt I would fax over the needed documents. That is what we are waiting on and that is a Adult nurse.  I have faxed it.

## 2023-05-31 ENCOUNTER — Encounter: Payer: Medicare HMO | Admitting: Physical Therapy

## 2023-06-02 ENCOUNTER — Encounter: Payer: Medicare HMO | Admitting: Rehabilitative and Restorative Service Providers"

## 2023-06-07 ENCOUNTER — Encounter: Payer: Medicare HMO | Admitting: Physical Therapy

## 2023-06-09 ENCOUNTER — Encounter: Payer: Medicare HMO | Admitting: Physical Therapy

## 2023-06-28 ENCOUNTER — Telehealth: Payer: Self-pay | Admitting: Podiatry

## 2023-06-28 NOTE — Telephone Encounter (Signed)
Pt called asking if her diabetic shoes are in..   Checked safestep and we only got the office visit notes not the required medicare paperwork with signatures.   I explained to pt and told her I would send it to Dr Wylene Simmer with a note explaining what is needed.  She said thank you

## 2023-07-12 ENCOUNTER — Telehealth: Payer: Self-pay | Admitting: Podiatry

## 2023-07-12 NOTE — Telephone Encounter (Signed)
Pt called again checking on diabetic shoes and I looked and we are still missing the documentation needed for medicare and that I did fax a note over on 10/15 requesting it.  She is to have them refax it as they said they have sent it.She was given our fax number and told to send it to attention [place].

## 2023-07-20 ENCOUNTER — Ambulatory Visit (INDEPENDENT_AMBULATORY_CARE_PROVIDER_SITE_OTHER): Payer: 59 | Admitting: Podiatry

## 2023-07-20 DIAGNOSIS — B351 Tinea unguium: Secondary | ICD-10-CM

## 2023-07-20 DIAGNOSIS — M79675 Pain in left toe(s): Secondary | ICD-10-CM

## 2023-07-20 DIAGNOSIS — Q828 Other specified congenital malformations of skin: Secondary | ICD-10-CM | POA: Diagnosis not present

## 2023-07-20 DIAGNOSIS — E119 Type 2 diabetes mellitus without complications: Secondary | ICD-10-CM

## 2023-07-20 DIAGNOSIS — M79674 Pain in right toe(s): Secondary | ICD-10-CM | POA: Diagnosis not present

## 2023-07-22 ENCOUNTER — Encounter: Payer: Self-pay | Admitting: Podiatry

## 2023-07-22 NOTE — Progress Notes (Signed)
  Subjective:  Patient ID: Mckenzie Rogers, female    DOB: 1940/11/03,  MRN: [id number]  Mckenzie Rogers presents to clinic today for at risk foot care. Pt has h/o NIDDM with PAD and painful porokeratotic lesion(s) right foot and painful mycotic toenails that limit ambulation. Painful toenails interfere with ambulation. Aggravating factors include wearing enclosed shoe gear. Pain is relieved with periodic professional debridement. Painful porokeratotic lesions are aggravated when weightbearing with and without shoegear. Pain is relieved with periodic professional debridement.  Chief Complaint  Patient presents with   Diabetes    DFC BS - DIDN'T CHECK IT  A1C - 6.1 LVPCP - 05/2023   New problem(s): None.   PCP is [name], [name], MD.  Allergies  Allergen Reactions   Aspirin Other (See Comments)    Pt unsure of reaction   Clorazepate Dipotassium Other (See Comments)    "Can't function"   Ketoprofen Other (See Comments)    Pt unsure of reaction   Oxycodone-Aspirin Swelling and Other (See Comments)    Tongue swells      Review of Systems: Negative except as noted in the HPI.  Objective: No changes noted in today's physical examination. There were no vitals filed for this visit. Mckenzie Rogers is a pleasant 82 y.o. female in NAD. AAO x 3.  Vascular Examination: CFT <3 seconds b/l. DP pulses faintly palpable b/l. PT pulses nonpalpable b/l. Digital hair absent. Skin temperature gradient warm to warm b/l. No pain with calf compression. No ischemia or gangrene. No cyanosis or clubbing noted b/l. Trace edema noted BLE.   Neurological Examination: Sensation grossly intact b/l with 10 gram monofilament. Vibratory sensation intact b/l.   Dermatological Examination: Toenails 2-5 b/l thick, discolored, elongated with subungual debris and pain on dorsal palpation.  Pedal skin thin, shiny and atrophic b/l LE. Anonychia noted bilateral great toes. Nailbed(s) epithelialized.   Porokeratotic lesion(s) R 2nd toe. No erythema, no edema, no drainage, no fluctuance.  Musculoskeletal Examination: Muscle strength 5/5 to all lower extremity muscle groups bilaterally. Pes planus deformity noted bilateral LE.  Radiographs: None  Last HgA1c:      Latest Ref Rng & Units 10/04/2022    6:51 PM 08/09/2022    1:42 PM  Hemoglobin A1C  Hemoglobin-A1c 4.8 - 5.6 % 6.3  7.5    Assessment/Plan: 1. Pain due to onychomycosis of toenails of both feet   2. Porokeratosis   3. Controlled type 2 diabetes mellitus without complication, without long-term current use of insulin (HCC)     -Consent given for treatment as described below: -Examined patient. -Continue diabetic foot care principles: inspect feet daily, monitor glucose as recommended by PCP and/or Endocrinologist, and follow prescribed diet per PCP, Endocrinologist and/or dietician. -Patient to continue soft, supportive shoe gear daily. -Mycotic toenails 2-5 bilaterally were debrided in length and girth with sterile nail nippers and dremel without iatrogenic bleeding. -Porokeratotic lesion(s) R 2nd toe pared and enucleated with sterile currette without incident. Total number of lesions debrided=1. -Patient/POA to call should there be question/concern in the interim.   Return in about 3 months (around 10/20/2023).  Freddie Breech, DPM

## 2023-07-29 ENCOUNTER — Telehealth: Payer: Self-pay | Admitting: Podiatry

## 2023-07-29 NOTE — Telephone Encounter (Signed)
Pt called checking on status of diabetic shoes.   Upon checking they got the paperwork on 11/6 and the inserts are in production.  I explained this to patient and told her to expect a call in a few weeks to get an appt to pick them up. She said thank you

## 2023-08-03 NOTE — Telephone Encounter (Signed)
Shoes are set to deliver today called patient and set appt for 11/27 10:30  Mckenzie Rogers CPed, CFo, CFm

## 2023-08-10 ENCOUNTER — Ambulatory Visit (INDEPENDENT_AMBULATORY_CARE_PROVIDER_SITE_OTHER): Payer: 59

## 2023-08-10 DIAGNOSIS — M2141 Flat foot [pes planus] (acquired), right foot: Secondary | ICD-10-CM

## 2023-08-10 DIAGNOSIS — M2142 Flat foot [pes planus] (acquired), left foot: Secondary | ICD-10-CM | POA: Diagnosis not present

## 2023-08-10 DIAGNOSIS — L84 Corns and callosities: Secondary | ICD-10-CM

## 2023-08-10 DIAGNOSIS — E119 Type 2 diabetes mellitus without complications: Secondary | ICD-10-CM | POA: Diagnosis not present

## 2023-08-10 DIAGNOSIS — M2011 Hallux valgus (acquired), right foot: Secondary | ICD-10-CM | POA: Diagnosis not present

## 2023-08-10 NOTE — Progress Notes (Signed)

## 2024-01-13 ENCOUNTER — Ambulatory Visit (INDEPENDENT_AMBULATORY_CARE_PROVIDER_SITE_OTHER): Admitting: Podiatry

## 2024-01-13 ENCOUNTER — Ambulatory Visit: Admitting: Podiatry

## 2024-01-13 DIAGNOSIS — M2041 Other hammer toe(s) (acquired), right foot: Secondary | ICD-10-CM | POA: Diagnosis not present

## 2024-01-13 NOTE — Progress Notes (Unsigned)
 Subjective:  Patient ID: Mckenzie Rogers, female    DOB: [birth year],  MRN: [id number]  No chief complaint on file.   83 y.o. female presents with the above complaint.  Patient presents with right second digit hammertoe contracture with distal tip hyperkeratotic lesion.  Hurts with ambulation bothers her a little bit.  She wanted to discuss treatment options for it she is a diabetic she has not seen anyone as prior to [facility] she denies any other acute complaints.  She has not had surgery to that toe   Review of Systems: Negative except as noted in the HPI. Denies N/V/F/Ch.  Past Medical History:  Diagnosis Date   Chronic kidney disease    Complication of anesthesia    "hard time waking me up after foot surgery"   Depression    Diabetes (HCC)    type 2   Edema    Family history of kidney cancer    Family history of lung cancer    Family history of pancreatic cancer    Osteoarthritis of both knees    Sleep apnea    "years ago" does not wear CPAP anymmore    Current Outpatient Medications:    acetaminophen  (TYLENOL ) 650 MG CR tablet, Take 650 mg by mouth every 8 (eight) hours as needed for pain., Disp: , Rfl:    albuterol  (PROVENTIL  HFA;VENTOLIN  HFA) 108 (90 Base) MCG/ACT inhaler, Inhale 1-2 puffs into the lungs every 6 (six) hours as needed for wheezing or shortness of breath. (Patient not taking: Reported on 10/06/2022), Disp: 1 Inhaler, Rfl: 0   allopurinol  (ZYLOPRIM ) 100 MG tablet, Take 1 tablet (100 mg total) by mouth 2 (two) times daily., Disp: 60 tablet, Rfl: 3   atorvastatin  (LIPITOR ) 80 MG tablet, Take 80 mg by mouth at bedtime., Disp: , Rfl:    bisacodyl  (DULCOLAX) 5 MG EC tablet, Take 2 tablets (10 mg total) by mouth daily as needed for moderate constipation., Disp: 30 tablet, Rfl: 0   colchicine  0.6 MG tablet, Take 1 tablet (0.6 mg total) by mouth 2 (two) times daily for 5 days. During acute gout attack, Disp: 10 tablet, Rfl: 0   docusate sodium  (COLACE) 100 MG capsule,  Take 1 capsule (100 mg total) by mouth 2 (two) times daily. (Patient not taking: Reported on 04/19/2023), Disp: 10 capsule, Rfl: 0   escitalopram  (LEXAPRO ) 20 MG tablet, Take 20 mg by mouth daily., Disp: , Rfl:    FARXIGA  10 MG TABS tablet, Take 10 mg by mouth daily., Disp: , Rfl:    HYDROmorphone  (DILAUDID ) 2 MG tablet, Take 0.5 tablets (1 mg total) by mouth every 6 (six) hours as needed for severe pain., Disp: 10 tablet, Rfl: 0   insulin  aspart (NOVOLOG ) 100 UNIT/ML injection, Inject 0-9 Units into the skin 3 (three) times daily with meals. Sliding scale CBG 70 - 120: 0 units CBG 121 - 150: 1 unit,  CBG 151 - 200: 2 units,  CBG 201 - 250: 3 units,  CBG 251 - 300: 5 units,  CBG 301 - 350: 7 units,  CBG 351 - 400: 9 units   CBG > 400: 9 units and notify your MD (Patient not taking: Reported on 04/19/2023), Disp: 10 mL, Rfl: 11   Insulin  Glargine (BASAGLAR  KWIKPEN) 100 UNIT/ML, Inject 16 Units into the skin daily., Disp: , Rfl:    methocarbamol  (ROBAXIN ) 500 MG tablet, Take 1 tablet (500 mg total) by mouth every 8 (eight) hours as needed for muscle spasms., Disp: 30 tablet,  Rfl: 0   mirtazapine  (REMERON ) 7.5 MG tablet, Take 1 tablet (7.5 mg total) by mouth at bedtime., Disp: , Rfl:    MYRBETRIQ  25 MG TB24 tablet, Take 25 mg by mouth daily., Disp: , Rfl:    polyethylene glycol (MIRALAX  / GLYCOLAX ) 17 g packet, Take 17 g by mouth daily as needed., Disp: 14 each, Rfl: 0   predniSONE  (DELTASONE ) 10 MG tablet, Take 30mg  PO daily for 1 day, then 20mg  daily x 3 days, then 10mg  daily for 3 days, then OFF, Disp: 9 tablet, Rfl: 0   TOVIAZ  4 MG TB24 tablet, Take 4 mg by mouth daily., Disp: , Rfl:   Social History   Tobacco Use  Smoking Status Never  Smokeless Tobacco Never    Allergies  Allergen Reactions   Aspirin  Other (See Comments)    Pt unsure of reaction   Clorazepate Dipotassium Other (See Comments)    "Can't function"   Ketoprofen Other (See Comments)    Pt unsure of reaction   Oxycodone-Aspirin   Swelling and Other (See Comments)    Tongue swells     Objective:  There were no vitals filed for this visit. There is no height or weight on file to calculate BMI. Constitutional Well developed. Well nourished.  Vascular Dorsalis pedis pulses palpable bilaterally. Posterior tibial pulses palpable bilaterally. Capillary refill normal to all digits.  No cyanosis or clubbing noted. Pedal hair growth normal.  Neurologic Normal speech. Oriented to person, place, and time. Epicritic sensation to light touch grossly present bilaterally.  Dermatologic Right second digit semiflexible hammertoe contracture with distal tip hyperkeratotic lesion no open wounds or lesion noted.  Orthopedic: Normal joint ROM without pain or crepitus bilaterally. No visible deformities. No bony tenderness.   Radiographs: None Assessment:   1. Hammertoe of second toe of right foot    Plan:  Patient was evaluated and treated and all questions answered.  Right second digit hammertoe semiflexible - All questions and concerns were discussed with the patient in extensive detail I discussed with her that she will benefit from shoe gear modification and toe protectors if there is no improvement we can discuss flexor tenotomy in the future.  There is some flexibility to it it will help reduce some of the pressure but I discussed with her may not be enough and may need a hammertoe procedure with arthroplasty.  She states understand will think about it we will get back to me  No follow-ups on file.

## 2024-02-02 ENCOUNTER — Ambulatory Visit (INDEPENDENT_AMBULATORY_CARE_PROVIDER_SITE_OTHER): Admitting: Orthopedic Surgery

## 2024-02-02 DIAGNOSIS — M19071 Primary osteoarthritis, right ankle and foot: Secondary | ICD-10-CM

## 2024-02-02 DIAGNOSIS — M76821 Posterior tibial tendinitis, right leg: Secondary | ICD-10-CM

## 2024-02-06 ENCOUNTER — Encounter: Payer: Self-pay | Admitting: Orthopedic Surgery

## 2024-02-06 NOTE — Progress Notes (Signed)
 Office Visit Note   Patient: Mckenzie Rogers           Date of Birth: 11-Oct-1940           MRN: [id number] Visit Date: 02/02/2024              Requested by: Suzzanne Estrin, MD [address] [place],  [place] [address] PCP: Kandyce Ort Kristina Pfeiffer, MD  Chief Complaint  Patient presents with   Right Ankle - Pain      HPI: Patient is 83 year old woman with right ankle pain.  Patient did have an injection of the right ankle a year ago and she states that this elevated her blood sugars.  She has pain over the dorsum of the foot and lateral ankle.  Patient has tried Voltaren gel without relief.  Assessment & Plan: Visit Diagnoses:  1. Arthritis of right ankle   2. Insufficiency of right posterior tibial tendon     Plan: Will place her in a short fracture boot to stabilize the talonavicular joint and hindfoot.  Follow-Up Instructions: Return in about 4 weeks (around 03/01/2024).   Ortho Exam  Patient is alert, oriented, no adenopathy, well-dressed, normal affect, normal respiratory effort. Examination radiograph shows osteoarthritis of the talonavicular joint.  Her heel is in valgus with essentially no ankle or subtalar motion.  Chronic posterior tibial tendon insufficiency.  Pain to palpation over the talonavicular joint.  Pain palpation of the sinus Tarsi.  Imaging: No results found. No images are attached to the encounter.  Labs: Lab Results  Component Value Date   HGBA1C 6.3 (H) 10/04/2022   HGBA1C 7.5 (H) 08/09/2022   ESRSEDRATE 59 (H) 10/06/2022   CRP 4.1 (H) 10/06/2022   LABURIC 5.7 11/08/2022   LABURIC 4.0 10/06/2022   LABURIC 5.7 10/05/2022   REPTSTATUS 10/11/2022 FINAL 10/08/2022   REPTSTATUS 10/13/2022 FINAL 10/08/2022   GRAMSTAIN  10/08/2022    FEW WBC PRESENT, PREDOMINANTLY MONONUCLEAR NO ORGANISMS SEEN    GRAMSTAIN  10/08/2022    FEW WBC PRESENT, PREDOMINANTLY MONONUCLEAR NO ORGANISMS SEEN    CULT  10/08/2022    NO GROWTH 3 DAYS Performed at  [facility] Lab, [address], [place], [place] [address]    CULT  10/08/2022    NO ANAEROBES ISOLATED Performed at [facility], [address], [place], [place] [address]    LABORGA ESCHERICHIA COLI (A) 10/04/2022     Lab Results  Component Value Date   ALBUMIN  3.2 (L) 10/05/2022   ALBUMIN  3.4 (L) 10/04/2022   ALBUMIN  3.1 (L) 09/13/2022   PREALBUMIN 9 (L) 10/05/2022    Lab Results  Component Value Date   MG 2.0 10/05/2022   No results found for: "VD25OH"  Lab Results  Component Value Date   PREALBUMIN 9 (L) 10/05/2022      Latest Ref Rng & Units 10/05/2022    1:20 PM 10/04/2022    6:51 PM 09/13/2022    5:18 PM  CBC EXTENDED  WBC 4.0 - 10.5 K/uL 3.2  4.3  4.8   RBC 3.87 - 5.11 MIL/uL 4.29  4.05  3.79   Hemoglobin 12.0 - 15.0 g/dL 69.6  29.5  28.4   HCT 36.0 - 46.0 % 40.9  38.6  36.0   Platelets 150 - 400 K/uL 205  217  264   NEUT# 1.7 - 7.7 K/uL  1.6  1.2   Lymph# 0.7 - 4.0 K/uL  2.0  2.6      There  is no height or weight on file to calculate BMI.  Orders:  No orders of the defined types were placed in this encounter.  No orders of the defined types were placed in this encounter.    Procedures: No procedures performed  Clinical Data: No additional findings.  ROS:  All other systems negative, except as noted in the HPI. Review of Systems  Objective: Vital Signs: LMP  (LMP Unknown)   Specialty Comments:  No specialty comments available.  PMFS History: Patient Active Problem List   Diagnosis Date Noted   Current moderate episode of major depressive disorder without prior episode (HCC) 01/10/2023   BMI 33.0-33.9,adult 01/10/2023   Primary osteoarthritis of both knees 01/10/2023   Debility 10/05/2022   Gout attack 10/04/2022   UTI (urinary tract infection) 10/04/2022   Arthritis of left knee 08/21/2022   OA (osteoarthritis) of knee 08/19/2022   S/P TKR (total knee replacement), left 08/19/2022   Controlled type 2 diabetes mellitus  without complication, without long-term current use of insulin  (HCC) 07/31/2021   Genetic testing 03/14/2020   Family history of pancreatic cancer    Family history of kidney cancer    Family history of lung cancer    Long term (current) use of insulin  (HCC) 11/01/2018   Chronic kidney disease due to hypertension 08/24/2018   Non-thrombocytopenic purpura (HCC) 01/14/2016   Overactive bladder 01/14/2016   Obstructive sleep apnea (adult) (pediatric) 12/10/2009   Diabetic renal disease (HCC) 05/10/2009   Disorder of bone 05/10/2009   Gastro-esophageal reflux disease without esophagitis 05/10/2009   Past Medical History:  Diagnosis Date   Chronic kidney disease    Complication of anesthesia    "hard time waking me up after foot surgery"   Depression    Diabetes (HCC)    type 2   Edema    Family history of kidney cancer    Family history of lung cancer    Family history of pancreatic cancer    Osteoarthritis of both knees    Sleep apnea    "years ago" does not wear CPAP anymmore    Family History  Problem Relation Age of Onset   Diabetes Maternal Aunt    CVA Daughter    Kidney failure Son    Pancreatic cancer Father 63   Kidney cancer Half-Sister 55       non-smoker   Lung cancer Half-Sister 34       hx of smoking   Cancer Half-Sister 60       unknown primary    Past Surgical History:  Procedure Laterality Date   ABDOMINAL HYSTERECTOMY     FOOT SURGERY Left    TOTAL KNEE ARTHROPLASTY Left 08/19/2022   Procedure: LEFT TOTAL KNEE ARTHROPLASTY;  Surgeon: Jasmine Mesi, MD;  Location: [facility] OR;  Service: Orthopedics;  Laterality: Left;   TUBAL LIGATION     Social History   Occupational History   Not on file  Tobacco Use   Smoking status: Never   Smokeless tobacco: Never  Vaping Use   Vaping status: Never Used  Substance and Sexual Activity   Alcohol  use: Never   Drug use: Never   Sexual activity: Not on file

## 2024-02-17 ENCOUNTER — Telehealth: Payer: Self-pay | Admitting: Family

## 2024-02-17 ENCOUNTER — Telehealth: Payer: Self-pay | Admitting: Orthopedic Surgery

## 2024-02-17 NOTE — Telephone Encounter (Signed)
 Patient called. Would like to be worked in on Monday. Says her ankle is swollen.

## 2024-02-17 NOTE — Telephone Encounter (Signed)
 Patient called and said that after Duda gave her a boot for her foot it made the ankle swell all the way up to her leg. [phone]

## 2024-02-20 ENCOUNTER — Ambulatory Visit: Admitting: Physician Assistant

## 2024-02-20 NOTE — Telephone Encounter (Signed)
 Pt has an appt with Maryanne tomorrow.

## 2024-02-20 NOTE — Telephone Encounter (Signed)
 Called patient, she is scheduled to see Norma Beckers at 1 pm today for her right ankle swelling.

## 2024-02-21 ENCOUNTER — Ambulatory Visit (INDEPENDENT_AMBULATORY_CARE_PROVIDER_SITE_OTHER): Admitting: Orthopaedic Surgery

## 2024-02-21 ENCOUNTER — Encounter: Payer: Self-pay | Admitting: Orthopaedic Surgery

## 2024-02-21 DIAGNOSIS — M25571 Pain in right ankle and joints of right foot: Secondary | ICD-10-CM

## 2024-02-21 NOTE — Progress Notes (Signed)
 Office Visit Note   Patient: Mckenzie Rogers           Date of Birth: Jan 29, 1941           MRN: [id number] Visit Date: 02/21/2024              Requested by: [name], Richard W, MD [address] [place],  [place] [address] PCP: Kandyce Ort Kristina Pfeiffer, MD   Assessment & Plan: Visit Diagnoses:  1. Pain in right ankle and joints of right foot     Plan: History of Present Illness Mckenzie Rogers is an 83 year old female who presents with ankle pain and swelling.  Previously seen by Dr. Julio Ohm.  She experiences pain and swelling primarily on the lateral aspect of her ankle and leg. The pain worsens with swelling, causing stability issues. She uses a boot but finds it difficult to maneuver and has trouble getting up while wearing it.  She usually wears compression socks but is not wearing them today. A home health nurse conducted a circulation test three weeks ago, showing good circulation in one leg and mild to moderate issues in the other. She has had multiple ultrasounds in the past to assess circulation and check for blood clots.  Physical Exam EXTREMITIES: Right ankle and leg swollen.  Skin intact.  Tenderness along peroneal tendons.  Assessment and Plan Ankle pain with swelling Chronic lateral ankle pain with swelling. Declined cortisone injection. MRI recommended. - Order MRI of the ankle. - Evaluate current ankle brace for support. - Recommend follow-up with Doctor Julio Ohm after MRI results.  Follow-Up Instructions: No follow-ups on file.   Orders:  Orders Placed This Encounter  Procedures   MR Ankle Right w/o contrast   No orders of the defined types were placed in this encounter.     Procedures: No procedures performed   Clinical Data: No additional findings.   Subjective: Chief Complaint  Patient presents with   Right Ankle - Pain    HPI  Review of Systems  Constitutional: Negative.   HENT: Negative.    Eyes: Negative.   Respiratory: Negative.     Cardiovascular: Negative.   Endocrine: Negative.   Musculoskeletal: Negative.   Neurological: Negative.   Hematological: Negative.   Psychiatric/Behavioral: Negative.    All other systems reviewed and are negative.    Objective: Vital Signs: LMP  (LMP Unknown)   Physical Exam Vitals and nursing note reviewed.  Constitutional:      Appearance: She is well-developed.  HENT:     Head: Atraumatic.     Nose: Nose normal.  Eyes:     Extraocular Movements: Extraocular movements intact.  Cardiovascular:     Pulses: Normal pulses.  Pulmonary:     Effort: Pulmonary effort is normal.  Abdominal:     Palpations: Abdomen is soft.  Musculoskeletal:     Cervical back: Neck supple.  Skin:    General: Skin is warm.     Capillary Refill: Capillary refill takes less than 2 seconds.  Neurological:     Mental Status: She is alert. Mental status is at baseline.  Psychiatric:        Behavior: Behavior normal.        Thought Content: Thought content normal.        Judgment: Judgment normal.     Ortho Exam  Specialty Comments:  No specialty comments available.  Imaging: No results found.   PMFS History: Patient Active Problem List   Diagnosis Date Noted  Current moderate episode of major depressive disorder without prior episode (HCC) 01/10/2023   BMI 33.0-33.9,adult 01/10/2023   Primary osteoarthritis of both knees 01/10/2023   Debility 10/05/2022   Gout attack 10/04/2022   UTI (urinary tract infection) 10/04/2022   Arthritis of left knee 08/21/2022   OA (osteoarthritis) of knee 08/19/2022   S/P TKR (total knee replacement), left 08/19/2022   Controlled type 2 diabetes mellitus without complication, without long-term current use of insulin  (HCC) 07/31/2021   Genetic testing 03/14/2020   Family history of pancreatic cancer    Family history of kidney cancer    Family history of lung cancer    Long term (current) use of insulin  (HCC) 11/01/2018   Chronic kidney disease  due to hypertension 08/24/2018   Non-thrombocytopenic purpura (HCC) 01/14/2016   Overactive bladder 01/14/2016   Obstructive sleep apnea (adult) (pediatric) 12/10/2009   Diabetic renal disease (HCC) 05/10/2009   Disorder of bone 05/10/2009   Gastro-esophageal reflux disease without esophagitis 05/10/2009   Past Medical History:  Diagnosis Date   Chronic kidney disease    Complication of anesthesia    "hard time waking me up after foot surgery"   Depression    Diabetes (HCC)    type 2   Edema    Family history of kidney cancer    Family history of lung cancer    Family history of pancreatic cancer    Osteoarthritis of both knees    Sleep apnea    "years ago" does not wear CPAP anymmore    Family History  Problem Relation Age of Onset   Diabetes Maternal Aunt    CVA Daughter    Kidney failure Son    Pancreatic cancer Father 37   Kidney cancer Half-Sister 17       non-smoker   Lung cancer Half-Sister 23       hx of smoking   Cancer Half-Sister 31       unknown primary    Past Surgical History:  Procedure Laterality Date   ABDOMINAL HYSTERECTOMY     FOOT SURGERY Left    TOTAL KNEE ARTHROPLASTY Left 08/19/2022   Procedure: LEFT TOTAL KNEE ARTHROPLASTY;  Surgeon: Jasmine Mesi, MD;  Location: [facility] OR;  Service: Orthopedics;  Laterality: Left;   TUBAL LIGATION     Social History   Occupational History   Not on file  Tobacco Use   Smoking status: Never   Smokeless tobacco: Never  Vaping Use   Vaping status: Never Used  Substance and Sexual Activity   Alcohol  use: Never   Drug use: Never   Sexual activity: Not on file

## 2024-03-03 ENCOUNTER — Other Ambulatory Visit

## 2024-03-03 ENCOUNTER — Inpatient Hospital Stay: Admission: RE | Admit: 2024-03-03 | Source: Ambulatory Visit

## 2024-03-08 ENCOUNTER — Ambulatory Visit
Admission: RE | Admit: 2024-03-08 | Discharge: 2024-03-08 | Disposition: A | Discharge status: Home / Self Care | Source: Ambulatory Visit | Attending: Orthopaedic Surgery | Admitting: Orthopaedic Surgery

## 2024-03-08 ENCOUNTER — Telehealth: Payer: Self-pay

## 2024-03-08 DIAGNOSIS — M25571 Pain in right ankle and joints of right foot: Secondary | ICD-10-CM

## 2024-03-08 NOTE — Telephone Encounter (Signed)
 FYI  MRI results for Right Ankle are in chart.

## 2024-03-09 ENCOUNTER — Ambulatory Visit: Payer: Self-pay | Admitting: Orthopaedic Surgery

## 2024-03-18 ENCOUNTER — Other Ambulatory Visit: Payer: Self-pay | Admitting: Family

## 2024-03-27 ENCOUNTER — Ambulatory Visit: Admitting: Orthopaedic Surgery

## 2024-04-03 ENCOUNTER — Ambulatory Visit: Admitting: Podiatry

## 2024-04-03 ENCOUNTER — Ambulatory Visit: Admitting: Orthopaedic Surgery

## 2024-04-18 ENCOUNTER — Ambulatory Visit (INDEPENDENT_AMBULATORY_CARE_PROVIDER_SITE_OTHER): Admitting: Orthopaedic Surgery

## 2024-04-18 ENCOUNTER — Encounter: Payer: Self-pay | Admitting: Orthopaedic Surgery

## 2024-04-18 DIAGNOSIS — M25571 Pain in right ankle and joints of right foot: Secondary | ICD-10-CM | POA: Insufficient documentation

## 2024-04-18 NOTE — Progress Notes (Signed)
 Office Visit Note   Patient: Mckenzie Rogers           Date of Birth: 27-Jul-1941           MRN: [id number] Visit Date: 04/18/2024              Requested by: Vernadine Charlie ORN, MD [address] [place],  [place] [address] PCP: Vernadine Charlie ORN, MD   Assessment & Plan: Visit Diagnoses:  1. Pain in right ankle and joints of right foot     Plan: History of Present Illness Mckenzie Rogers is an 83 year old female who presents with bilateral ankle swelling and MRI review.  She experiences significant bilateral ankle swelling, more pronounced in the right ankle, without associated pain. Compression socks and leg elevation for three weeks reduced the swelling, but it recurred with normal activities. Special shoes are used for her swollen feet, though sometimes they are unwearable due to the swelling's extent.  An MRI previously identified a stress fracture of the fibula with callus formation, yet she remains pain-free in the affected area.  She uses an elliptical machine three times daily to maintain joint mobility.  Assessment and Plan Healing stress fracture of right fibula.  Basically asymptomatic. Stress fracture healing well. - Lower extremity edema is actually more symptomatic to her.  Lower extremity lymphedema Chronic bilateral leg swelling, right leg more affected. Elevation reduces swelling. Compression socks may be inadequate. - Refer to lymphedema clinic for specialized management.  Follow-Up Instructions: No follow-ups on file.   Orders:  No orders of the defined types were placed in this encounter.  No orders of the defined types were placed in this encounter.     Procedures: No procedures performed   Clinical Data: No additional findings.   Subjective: Chief Complaint  Patient presents with   Right Ankle - Follow-up    MRI review    HPI  Review of Systems   Objective: Vital Signs: LMP  (LMP Unknown)   Physical Exam  Ortho  Exam  Specialty Comments:  No specialty comments available.  Imaging: No results found.   PMFS History: Patient Active Problem List   Diagnosis Date Noted   Pain in right ankle and joints of right foot 04/18/2024   Current moderate episode of major depressive disorder without prior episode (HCC) 01/10/2023   BMI 33.0-33.9,adult 01/10/2023   Primary osteoarthritis of both knees 01/10/2023   Debility 10/05/2022   Gout attack 10/04/2022   UTI (urinary tract infection) 10/04/2022   Arthritis of left knee 08/21/2022   OA (osteoarthritis) of knee 08/19/2022   S/P TKR (total knee replacement), left 08/19/2022   Controlled type 2 diabetes mellitus without complication, without long-term current use of insulin  (HCC) 07/31/2021   Genetic testing 03/14/2020   Family history of pancreatic cancer    Family history of kidney cancer    Family history of lung cancer    Long term (current) use of insulin  (HCC) 11/01/2018   Chronic kidney disease due to hypertension 08/24/2018   Non-thrombocytopenic purpura (HCC) 01/14/2016   Overactive bladder 01/14/2016   Obstructive sleep apnea (adult) (pediatric) 12/10/2009   Diabetic renal disease (HCC) 05/10/2009   Disorder of bone 05/10/2009   Gastro-esophageal reflux disease without esophagitis 05/10/2009   Past Medical History:  Diagnosis Date   Chronic kidney disease    Complication of anesthesia    hard time waking me up after foot surgery   Depression    Diabetes (HCC)  type 2   Edema    Family history of kidney cancer    Family history of lung cancer    Family history of pancreatic cancer    Osteoarthritis of both knees    Sleep apnea    years ago does not wear CPAP anymmore    Family History  Problem Relation Age of Onset   Diabetes Maternal Aunt    CVA Daughter    Kidney failure Son    Pancreatic cancer Father 15   Kidney cancer Half-Sister 67       non-smoker   Lung cancer Half-Sister 41       hx of smoking   Cancer  Half-Sister 41       unknown primary    Past Surgical History:  Procedure Laterality Date   ABDOMINAL HYSTERECTOMY     FOOT SURGERY Left    TOTAL KNEE ARTHROPLASTY Left 08/19/2022   Procedure: LEFT TOTAL KNEE ARTHROPLASTY;  Surgeon: Addie Cordella Hamilton, MD;  Location: [facility] OR;  Service: Orthopedics;  Laterality: Left;   TUBAL LIGATION     Social History   Occupational History   Not on file  Tobacco Use   Smoking status: Never   Smokeless tobacco: Never  Vaping Use   Vaping status: Never Used  Substance and Sexual Activity   Alcohol  use: Never   Drug use: Never   Sexual activity: Not on file

## 2024-04-25 ENCOUNTER — Encounter: Payer: Self-pay | Admitting: Family

## 2024-04-25 ENCOUNTER — Ambulatory Visit: Admitting: Family

## 2024-04-25 DIAGNOSIS — M79604 Pain in right leg: Secondary | ICD-10-CM

## 2024-04-25 DIAGNOSIS — R5381 Other malaise: Secondary | ICD-10-CM

## 2024-04-25 DIAGNOSIS — Z6833 Body mass index (BMI) 33.0-33.9, adult: Secondary | ICD-10-CM | POA: Diagnosis not present

## 2024-04-25 DIAGNOSIS — I890 Lymphedema, not elsewhere classified: Secondary | ICD-10-CM | POA: Diagnosis not present

## 2024-04-25 DIAGNOSIS — I872 Venous insufficiency (chronic) (peripheral): Secondary | ICD-10-CM

## 2024-04-25 DIAGNOSIS — G8929 Other chronic pain: Secondary | ICD-10-CM

## 2024-04-25 NOTE — Progress Notes (Signed)
 Office Visit Note   Patient: Mckenzie Rogers           Date of Birth: Nov 23, 1940           MRN: [id number] Visit Date: 04/25/2024              Requested by: Vernadine Charlie ORN, MD [address] [place],  [place] [address] PCP: Vernadine Charlie ORN, MD  Chief Complaint  Patient presents with   Right Leg - Follow-up   Left Leg - Follow-up      HPI: The patient is a 83 year old woman who is seen in referral from Dr. Patria for issues with intractable edema bilateral lower extremities right worse than left.  She has been having very modest improvement with compression she has tried a 4-week course of compression garments together with a home exercise program and elevation without reduction in her edema.  Assessment & Plan: Visit Diagnoses: No diagnosis found.  Plan: We will get her set up with lymphedema pumps bilaterally discussed lymphedema and typical course recommended continued use of compression garments bilateral lower extremities as well as elevation.  Follow-Up Instructions: Return in about 4 weeks (around 05/23/2024).   Ortho Exam  Patient is alert, oriented, no adenopathy, well-dressed, normal affect, normal respiratory effort. On examination bilateral lower extremities there is significant edema with pitting present bilaterally right worse than left.  There is hyperpigmentation skin shiny there is no hair growth there is no weeping no open ulcer  Circumference right ankle 36 cm calf 44 cm thigh 53 cm left ankle 31 cm left calf 44 cm left thigh 50 cm  Imaging: No results found. No images are attached to the encounter.  Labs: Lab Results  Component Value Date   HGBA1C 6.3 (H) 10/04/2022   HGBA1C 7.5 (H) 08/09/2022   ESRSEDRATE 59 (H) 10/06/2022   CRP 4.1 (H) 10/06/2022   LABURIC 5.7 11/08/2022   LABURIC 4.0 10/06/2022   LABURIC 5.7 10/05/2022   REPTSTATUS 10/11/2022 FINAL 10/08/2022   REPTSTATUS 10/13/2022 FINAL 10/08/2022   GRAMSTAIN  10/08/2022    FEW WBC  PRESENT, PREDOMINANTLY MONONUCLEAR NO ORGANISMS SEEN    GRAMSTAIN  10/08/2022    FEW WBC PRESENT, PREDOMINANTLY MONONUCLEAR NO ORGANISMS SEEN    CULT  10/08/2022    NO GROWTH 3 DAYS Performed at [facility], [address], [place], [place] [address]    CULT  10/08/2022    NO ANAEROBES ISOLATED Performed at [facility], [address] [place], [place] [address]    LABORGA ESCHERICHIA COLI (A) 10/04/2022     Lab Results  Component Value Date   ALBUMIN  3.2 (L) 10/05/2022   ALBUMIN  3.4 (L) 10/04/2022   ALBUMIN  3.1 (L) 09/13/2022   PREALBUMIN 9 (L) 10/05/2022    Lab Results  Component Value Date   MG 2.0 10/05/2022   No results found for: [facility]  Lab Results  Component Value Date   PREALBUMIN 9 (L) 10/05/2022      Latest Ref Rng & Units 10/05/2022    1:20 PM 10/04/2022    6:51 PM 09/13/2022    5:18 PM  CBC EXTENDED  WBC 4.0 - 10.5 K/uL 3.2  4.3  4.8   RBC 3.87 - 5.11 MIL/uL 4.29  4.05  3.79   Hemoglobin 12.0 - 15.0 g/dL 87.1  87.8  88.5   HCT 36.0 - 46.0 % 40.9  38.6  36.0   Platelets 150 - 400 K/uL 205  217  264  NEUT# 1.7 - 7.7 K/uL  1.6  1.2   Lymph# 0.7 - 4.0 K/uL  2.0  2.6      There is no height or weight on file to calculate BMI.  Orders:  No orders of the defined types were placed in this encounter.  No orders of the defined types were placed in this encounter.    Procedures: No procedures performed  Clinical Data: No additional findings.  ROS:  All other systems negative, except as noted in the HPI. Review of Systems  Objective: Vital Signs: LMP  (LMP Unknown)   Specialty Comments:  No specialty comments available.  PMFS History: Patient Active Problem List   Diagnosis Date Noted   Pain in right ankle and joints of right foot 04/18/2024   Current moderate episode of major depressive disorder without prior episode (HCC) 01/10/2023   BMI 33.0-33.9,adult 01/10/2023   Primary osteoarthritis of both knees 01/10/2023    Debility 10/05/2022   Gout attack 10/04/2022   UTI (urinary tract infection) 10/04/2022   Arthritis of left knee 08/21/2022   OA (osteoarthritis) of knee 08/19/2022   S/P TKR (total knee replacement), left 08/19/2022   Controlled type 2 diabetes mellitus without complication, without long-term current use of insulin  (HCC) 07/31/2021   Genetic testing 03/14/2020   Family history of pancreatic cancer    Family history of kidney cancer    Family history of lung cancer    Long term (current) use of insulin  (HCC) 11/01/2018   Chronic kidney disease due to hypertension 08/24/2018   Non-thrombocytopenic purpura (HCC) 01/14/2016   Overactive bladder 01/14/2016   Obstructive sleep apnea (adult) (pediatric) 12/10/2009   Diabetic renal disease (HCC) 05/10/2009   Disorder of bone 05/10/2009   Gastro-esophageal reflux disease without esophagitis 05/10/2009   Past Medical History:  Diagnosis Date   Chronic kidney disease    Complication of anesthesia    hard time waking me up after foot surgery   Depression    Diabetes (HCC)    type 2   Edema    Family history of kidney cancer    Family history of lung cancer    Family history of pancreatic cancer    Osteoarthritis of both knees    Sleep apnea    years ago does not wear CPAP anymmore    Family History  Problem Relation Age of Onset   Diabetes Maternal Aunt    CVA Daughter    Kidney failure Son    Pancreatic cancer Father 9   Kidney cancer Half-Sister 48       non-smoker   Lung cancer Half-Sister 57       hx of smoking   Cancer Half-Sister 83       unknown primary    Past Surgical History:  Procedure Laterality Date   ABDOMINAL HYSTERECTOMY     FOOT SURGERY Left    TOTAL KNEE ARTHROPLASTY Left 08/19/2022   Procedure: LEFT TOTAL KNEE ARTHROPLASTY;  Surgeon: Addie Cordella Hamilton, MD;  Location: [facility] OR;  Service: Orthopedics;  Laterality: Left;   TUBAL LIGATION     Social History   Occupational History   Not on file   Tobacco Use   Smoking status: Never   Smokeless tobacco: Never  Vaping Use   Vaping status: Never Used  Substance and Sexual Activity   Alcohol  use: Never   Drug use: Never   Sexual activity: Not on file

## 2024-04-27 ENCOUNTER — Telehealth: Payer: Self-pay | Admitting: Orthopaedic Surgery

## 2024-04-27 NOTE — Telephone Encounter (Signed)
 Pt called stating Dr Jerri was to send a script for a machine she needs. Please call pt about this matter at [phone].

## 2024-04-27 NOTE — Telephone Encounter (Signed)
 Called patient. Mckenzie Rogers submitted everything to [employer] yesterday. It can take a few weeks to get insurance approval. Once they receive that that approval, they will be in touch with her to set up.

## 2024-04-27 NOTE — Telephone Encounter (Signed)
 Called patient. She is wanting to know when she will receive the lymphedema pumps. I have messaged Dr.Duda's team to get more information and will call patient back.

## 2024-05-02 ENCOUNTER — Telehealth: Payer: Self-pay

## 2024-05-02 NOTE — Telephone Encounter (Signed)
 Patient called triage today. She wanted to make sure that the order for her lymphedema pumps was for bilateral. I confirmed with Autumn F and pump rep. The order is for bilateral.

## 2024-05-04 ENCOUNTER — Telehealth: Payer: Self-pay

## 2024-05-04 NOTE — Telephone Encounter (Signed)
 Noted. Thanks Rosina.  Yes patient has called several times this week and last week about her pumps. We have told her that the order and Rx are being processed. Just waiting for insurance to do their end of things. She was told during her last OV with us  that this can take up to 30 days to process. If she has questions about the status she need to contact [employer] directly at this point. She needs to continue wearing her compression at all times, this is going to keep her swelling down.

## 2024-05-04 NOTE — Telephone Encounter (Signed)
 Patient called triage again stating that her legs continue to get larger and larger I told her it looks like the pumps have been ordered we are just waiting on them to come in I told her to continue wearing her stockings and elevating her legs But I told her we would let you guys know she called

## 2024-05-25 ENCOUNTER — Ambulatory Visit: Admitting: Family

## 2024-05-29 ENCOUNTER — Ambulatory Visit (INDEPENDENT_AMBULATORY_CARE_PROVIDER_SITE_OTHER): Admitting: Family

## 2024-05-29 DIAGNOSIS — I890 Lymphedema, not elsewhere classified: Secondary | ICD-10-CM

## 2024-05-29 DIAGNOSIS — I872 Venous insufficiency (chronic) (peripheral): Secondary | ICD-10-CM

## 2024-05-29 NOTE — Progress Notes (Signed)
 Office Visit Note   Patient: Mckenzie Rogers           Date of Birth: 01-Jan-1941           MRN: [id number] Visit Date: 05/29/2024              Requested by: Vernadine Charlie ORN, MD [address] [place],  [place] [address] PCP: Vernadine Charlie ORN, MD  Chief Complaint  Patient presents with   Right Leg - Follow-up   Left Leg - Follow-up      HPI: The patient is an 83 year old woman who is seen in follow-up she was referred to us  by Dr. Patria for intractable edema bilateral lower extremities she has been set up with lymphedema pumps she recently obtained these as well as orientation to use she has not yet began using them she also has recently gotten on the elliptical and wonders if she should begin using this  Assessment & Plan: Visit Diagnoses: No diagnosis found.  Plan: Recommended using the lymphedema pumps daily as instructed by tactile.  Recommended beginning use of her elliptical as well increasing her activity she will follow-up with us  in 1 month for this as needed  Follow-Up Instructions: No follow-ups on file.   Ortho Exam  Patient is alert, oriented, no adenopathy, well-dressed, normal affect, normal respiratory effort. On examination bilateral lower extremities she does have significant edema present with pitting as well.  Right is greater than left.  She has hyperpigmentation and shiny skin without hair growth there are no open areas no weeping today   Imaging: No results found. No images are attached to the encounter.  Labs: Lab Results  Component Value Date   HGBA1C 6.3 (H) 10/04/2022   HGBA1C 7.5 (H) 08/09/2022   ESRSEDRATE 59 (H) 10/06/2022   CRP 4.1 (H) 10/06/2022   LABURIC 5.7 11/08/2022   LABURIC 4.0 10/06/2022   LABURIC 5.7 10/05/2022   REPTSTATUS 10/11/2022 FINAL 10/08/2022   REPTSTATUS 10/13/2022 FINAL 10/08/2022   GRAMSTAIN  10/08/2022    FEW WBC PRESENT, PREDOMINANTLY MONONUCLEAR NO ORGANISMS SEEN    GRAMSTAIN  10/08/2022    FEW WBC  PRESENT, PREDOMINANTLY MONONUCLEAR NO ORGANISMS SEEN    CULT  10/08/2022    NO GROWTH 3 DAYS Performed at [facility], [address], [place], [place] [address]    CULT  10/08/2022    NO ANAEROBES ISOLATED Performed at [facility], [address], [place], [place] [address]    LABORGA ESCHERICHIA COLI (A) 10/04/2022     Lab Results  Component Value Date   ALBUMIN  3.2 (L) 10/05/2022   ALBUMIN  3.4 (L) 10/04/2022   ALBUMIN  3.1 (L) 09/13/2022   PREALBUMIN 9 (L) 10/05/2022    Lab Results  Component Value Date   MG 2.0 10/05/2022   No results found for: [facility]  Lab Results  Component Value Date   PREALBUMIN 9 (L) 10/05/2022      Latest Ref Rng & Units 10/05/2022    1:20 PM 10/04/2022    6:51 PM 09/13/2022    5:18 PM  CBC EXTENDED  WBC 4.0 - 10.5 K/uL 3.2  4.3  4.8   RBC 3.87 - 5.11 MIL/uL 4.29  4.05  3.79   Hemoglobin 12.0 - 15.0 g/dL 87.1  87.8  88.5   HCT 36.0 - 46.0 % 40.9  38.6  36.0   Platelets 150 - 400 K/uL 205  217  264   NEUT# 1.7 - 7.7 K/uL  1.6  1.2   Lymph# 0.7 - 4.0 K/uL  2.0  2.6      There is no height or weight on file to calculate BMI.  Orders:  No orders of the defined types were placed in this encounter.  No orders of the defined types were placed in this encounter.    Procedures: No procedures performed  Clinical Data: No additional findings.  ROS:  All other systems negative, except as noted in the HPI. Review of Systems  Objective: Vital Signs: LMP  (LMP Unknown)   Specialty Comments:  No specialty comments available.  PMFS History: Patient Active Problem List   Diagnosis Date Noted   Pain in right ankle and joints of right foot 04/18/2024   Current moderate episode of major depressive disorder without prior episode (HCC) 01/10/2023   BMI 33.0-33.9,adult 01/10/2023   Primary osteoarthritis of both knees 01/10/2023   Debility 10/05/2022   Gout attack 10/04/2022   UTI (urinary tract infection) 10/04/2022    Arthritis of left knee 08/21/2022   OA (osteoarthritis) of knee 08/19/2022   S/P TKR (total knee replacement), left 08/19/2022   Controlled type 2 diabetes mellitus without complication, without long-term current use of insulin  (HCC) 07/31/2021   Genetic testing 03/14/2020   Family history of pancreatic cancer    Family history of kidney cancer    Family history of lung cancer    Long term (current) use of insulin  (HCC) 11/01/2018   Chronic kidney disease due to hypertension 08/24/2018   Non-thrombocytopenic purpura (HCC) 01/14/2016   Overactive bladder 01/14/2016   Obstructive sleep apnea (adult) (pediatric) 12/10/2009   Diabetic renal disease (HCC) 05/10/2009   Disorder of bone 05/10/2009   Gastro-esophageal reflux disease without esophagitis 05/10/2009   Past Medical History:  Diagnosis Date   Chronic kidney disease    Complication of anesthesia    hard time waking me up after foot surgery   Depression    Diabetes (HCC)    type 2   Edema    Family history of kidney cancer    Family history of lung cancer    Family history of pancreatic cancer    Osteoarthritis of both knees    Sleep apnea    years ago does not wear CPAP anymmore    Family History  Problem Relation Age of Onset   Diabetes Maternal Aunt    CVA Daughter    Kidney failure Son    Pancreatic cancer Father 63   Kidney cancer Half-Sister 36       non-smoker   Lung cancer Half-Sister 23       hx of smoking   Cancer Half-Sister 14       unknown primary    Past Surgical History:  Procedure Laterality Date   ABDOMINAL HYSTERECTOMY     FOOT SURGERY Left    TOTAL KNEE ARTHROPLASTY Left 08/19/2022   Procedure: LEFT TOTAL KNEE ARTHROPLASTY;  Surgeon: Addie Cordella Hamilton, MD;  Location: [facility] OR;  Service: Orthopedics;  Laterality: Left;   TUBAL LIGATION     Social History   Occupational History   Not on file  Tobacco Use   Smoking status: Never   Smokeless tobacco: Never  Vaping Use   Vaping status:  Never Used  Substance and Sexual Activity   Alcohol  use: Never   Drug use: Never   Sexual activity: Not on file

## 2024-06-05 ENCOUNTER — Telehealth: Payer: Self-pay | Admitting: Family

## 2024-06-05 NOTE — Telephone Encounter (Signed)
 Patient called and said she is itching, has redness and swollen in both legs.  CB#[phone]

## 2024-06-07 ENCOUNTER — Ambulatory Visit: Admitting: Physician Assistant

## 2024-06-07 ENCOUNTER — Encounter: Payer: Self-pay | Admitting: Physician Assistant

## 2024-06-07 DIAGNOSIS — I890 Lymphedema, not elsewhere classified: Secondary | ICD-10-CM | POA: Diagnosis not present

## 2024-06-07 NOTE — Progress Notes (Signed)
 Office Visit Note   Patient: Mckenzie Rogers           Date of Birth: 1940/11/27           MRN: [id number] Visit Date: 06/07/2024              Requested by: Vernadine Charlie ORN, MD [address] [place],  [place] [address] PCP: Vernadine, Charlie ORN, MD  Chief Complaint  Patient presents with   Right Knee - Pain   Left Knee - Pain      HPI: 83 y/o female with bilateral LE edema.  She has Lymphedema and has pumps at home.  She states she uses them sitting up and does not elevate her legs as often otherwise.  She does have compression socks at home.  She did not wear them today since she was coming here.    Her CC today is swelling and itching of both lower legs.  She denies fever and chills, as well as no open wounds.   Assessment & Plan: Visit Diagnoses:  1. Lymphedema     Plan: I demonstrated proper leg elevation with her feet and legs above her heart.  I gave her a handout.  She should lay down to use her pumps as well.  Walk for exercise or use the elliptical floor exercise as tolerates.    Follow-Up Instructions: Return if symptoms worsen or fail to improve.   Ortho Exam  Patient is alert, oriented, no adenopathy, well-dressed, normal affect, normal respiratory effort. Bilateral LE pitting edema into the feet dorsum, compartments are soft, no weeping or cellulitis.  Feet are warm and well perfused with evidence of wounds or scars.      Imaging: No results found. No images are attached to the encounter.  Labs: Lab Results  Component Value Date   HGBA1C 6.3 (H) 10/04/2022   HGBA1C 7.5 (H) 08/09/2022   ESRSEDRATE 59 (H) 10/06/2022   CRP 4.1 (H) 10/06/2022   LABURIC 5.7 11/08/2022   LABURIC 4.0 10/06/2022   LABURIC 5.7 10/05/2022   REPTSTATUS 10/11/2022 FINAL 10/08/2022   REPTSTATUS 10/13/2022 FINAL 10/08/2022   GRAMSTAIN  10/08/2022    FEW WBC PRESENT, PREDOMINANTLY MONONUCLEAR NO ORGANISMS SEEN    GRAMSTAIN  10/08/2022    FEW WBC PRESENT, PREDOMINANTLY  MONONUCLEAR NO ORGANISMS SEEN    CULT  10/08/2022    NO GROWTH 3 DAYS Performed at [facility], [address] [place], [place] [address]    CULT  10/08/2022    NO ANAEROBES ISOLATED Performed at [facility], [address] [place], [place] [address]    LABORGA ESCHERICHIA COLI (A) 10/04/2022     Lab Results  Component Value Date   ALBUMIN  3.2 (L) 10/05/2022   ALBUMIN  3.4 (L) 10/04/2022   ALBUMIN  3.1 (L) 09/13/2022   PREALBUMIN 9 (L) 10/05/2022    Lab Results  Component Value Date   MG 2.0 10/05/2022   No results found for: [facility]  Lab Results  Component Value Date   PREALBUMIN 9 (L) 10/05/2022      Latest Ref Rng & Units 10/05/2022    1:20 PM 10/04/2022    6:51 PM 09/13/2022    5:18 PM  CBC EXTENDED  WBC 4.0 - 10.5 K/uL 3.2  4.3  4.8   RBC 3.87 - 5.11 MIL/uL 4.29  4.05  3.79   Hemoglobin 12.0 - 15.0 g/dL 87.1  87.8  88.5   HCT 36.0 - 46.0 % 40.9  38.6  36.0   Platelets 150 - 400 K/uL 205  217  264   NEUT# 1.7 - 7.7 K/uL  1.6  1.2   Lymph# 0.7 - 4.0 K/uL  2.0  2.6      There is no height or weight on file to calculate BMI.  Orders:  No orders of the defined types were placed in this encounter.  No orders of the defined types were placed in this encounter.    Procedures: No procedures performed  Clinical Data: No additional findings.  ROS:  All other systems negative, except as noted in the HPI. Review of Systems  Objective: Vital Signs: LMP  (LMP Unknown)   Specialty Comments:  No specialty comments available.  PMFS History: Patient Active Problem List   Diagnosis Date Noted   Pain in right ankle and joints of right foot 04/18/2024   Current moderate episode of major depressive disorder without prior episode (HCC) 01/10/2023   BMI 33.0-33.9,adult 01/10/2023   Primary osteoarthritis of both knees 01/10/2023   Debility 10/05/2022   Gout attack 10/04/2022   UTI (urinary tract infection) 10/04/2022   Arthritis of left knee  08/21/2022   OA (osteoarthritis) of knee 08/19/2022   S/P TKR (total knee replacement), left 08/19/2022   Controlled type 2 diabetes mellitus without complication, without long-term current use of insulin  (HCC) 07/31/2021   Genetic testing 03/14/2020   Family history of pancreatic cancer    Family history of kidney cancer    Family history of lung cancer    Long term (current) use of insulin  (HCC) 11/01/2018   Chronic kidney disease due to hypertension 08/24/2018   Non-thrombocytopenic purpura 01/14/2016   Overactive bladder 01/14/2016   Obstructive sleep apnea (adult) (pediatric) 12/10/2009   Diabetic renal disease (HCC) 05/10/2009   Disorder of bone 05/10/2009   Gastro-esophageal reflux disease without esophagitis 05/10/2009   Past Medical History:  Diagnosis Date   Chronic kidney disease    Complication of anesthesia    hard time waking me up after foot surgery   Depression    Diabetes (HCC)    type 2   Edema    Family history of kidney cancer    Family history of lung cancer    Family history of pancreatic cancer    Osteoarthritis of both knees    Sleep apnea    years ago does not wear CPAP anymmore    Family History  Problem Relation Age of Onset   Diabetes Maternal Aunt    CVA Daughter    Kidney failure Son    Pancreatic cancer Father 62   Kidney cancer Half-Sister 72       non-smoker   Lung cancer Half-Sister 96       hx of smoking   Cancer Half-Sister 64       unknown primary    Past Surgical History:  Procedure Laterality Date   ABDOMINAL HYSTERECTOMY     FOOT SURGERY Left    TOTAL KNEE ARTHROPLASTY Left 08/19/2022   Procedure: LEFT TOTAL KNEE ARTHROPLASTY;  Surgeon: Addie Cordella Hamilton, MD;  Location: [facility] OR;  Service: Orthopedics;  Laterality: Left;   TUBAL LIGATION     Social History   Occupational History   Not on file  Tobacco Use   Smoking status: Never   Smokeless tobacco: Never  Vaping Use   Vaping status: Never Used  Substance and  Sexual Activity   Alcohol  use: Never   Drug use: Never   Sexual activity:  Not on file

## 2024-07-16 ENCOUNTER — Encounter: Payer: Self-pay | Admitting: Radiology

## 2024-07-18 ENCOUNTER — Ambulatory Visit: Admitting: Podiatry

## 2024-07-26 ENCOUNTER — Ambulatory Visit: Admitting: Podiatry

## 2024-07-31 ENCOUNTER — Telehealth: Payer: Self-pay | Admitting: Orthopaedic Surgery

## 2024-07-31 NOTE — Telephone Encounter (Signed)
 Can you please call pt and make an appt for tomorrow. Both [facility] and [facility] have time open.

## 2024-07-31 NOTE — Telephone Encounter (Signed)
 I called the pt to offer an appt for tomorrow and sahe was not able to attend. She will come in on Thursday at 1:45.

## 2024-07-31 NOTE — Telephone Encounter (Signed)
 Patient called and said she has blisters on both legs that's coming from Lymphedema and the machine she has from us  is causing them. CB# [phone]

## 2024-08-02 ENCOUNTER — Ambulatory Visit: Admitting: Physician Assistant

## 2024-08-02 DIAGNOSIS — I890 Lymphedema, not elsewhere classified: Secondary | ICD-10-CM

## 2024-08-02 NOTE — Progress Notes (Signed)
 Office Visit Note   Patient: Mckenzie Rogers           Date of Birth: 07-16-1941           MRN: [id number] Visit Date: 08/02/2024              Requested by: Vernadine Charlie ORN, MD [address] [place],  [place] [address] PCP: Vernadine, Charlie ORN, MD  Chief Complaint  Patient presents with   Left Leg - Follow-up   Right Leg - Follow-up      HPI: 83 y/o female with bilateral LE edema.  She has Lymphedema and has pumps at home. She states she uses them sitting up and does not elevate her legs as often otherwise. She does have compression socks at home. She did not wear them today since she was coming here.   She noticed some areas of slight darer colors on her anterior distal shins.  She is here to make sure they are not problematic wounds.    Assessment & Plan: Visit Diagnoses: No diagnosis found.  Plan: We discussed the importance of elevation multiple times a day.  It would improve edema also if she elevates her legs while using her Lymphedema pumps twice daily instead of sitting up and using them once a day.  She will also try to increase her mobility with walking and she can use her elliptical floor exercise.  She will call if she has nay concerns.   Follow-Up Instructions: Return if symptoms worsen or fail to improve.   Ortho Exam  Patient is alert, oriented, no adenopathy, well-dressed, normal affect, normal respiratory effort. Positive edema B LE pitting.  No open wounds, cellulitis, or weeping.  Doppler signals PT/DP intact B LE biphasic.      Imaging: No results found.    Labs: Lab Results  Component Value Date   HGBA1C 6.3 (H) 10/04/2022   HGBA1C 7.5 (H) 08/09/2022   ESRSEDRATE 59 (H) 10/06/2022   CRP 4.1 (H) 10/06/2022   LABURIC 5.7 11/08/2022   LABURIC 4.0 10/06/2022   LABURIC 5.7 10/05/2022   REPTSTATUS 10/11/2022 FINAL 10/08/2022   REPTSTATUS 10/13/2022 FINAL 10/08/2022   GRAMSTAIN  10/08/2022    FEW WBC PRESENT, PREDOMINANTLY MONONUCLEAR NO  ORGANISMS SEEN    GRAMSTAIN  10/08/2022    FEW WBC PRESENT, PREDOMINANTLY MONONUCLEAR NO ORGANISMS SEEN    CULT  10/08/2022    NO GROWTH 3 DAYS Performed at [facility], [address], [place], [place] [address]    CULT  10/08/2022    NO ANAEROBES ISOLATED Performed at [facility], [address], [place], [place] [address]    LABORGA ESCHERICHIA COLI (A) 10/04/2022     Lab Results  Component Value Date   ALBUMIN  3.2 (L) 10/05/2022   ALBUMIN  3.4 (L) 10/04/2022   ALBUMIN  3.1 (L) 09/13/2022   PREALBUMIN 9 (L) 10/05/2022    Lab Results  Component Value Date   MG 2.0 10/05/2022   No results found for: [facility]  Lab Results  Component Value Date   PREALBUMIN 9 (L) 10/05/2022      Latest Ref Rng & Units 10/05/2022    1:20 PM 10/04/2022    6:51 PM 09/13/2022    5:18 PM  CBC EXTENDED  WBC 4.0 - 10.5 K/uL 3.2  4.3  4.8   RBC 3.87 - 5.11 MIL/uL 4.29  4.05  3.79   Hemoglobin 12.0 - 15.0 g/dL 87.1  87.8  88.5   HCT  36.0 - 46.0 % 40.9  38.6  36.0   Platelets 150 - 400 K/uL 205  217  264   NEUT# 1.7 - 7.7 K/uL  1.6  1.2   Lymph# 0.7 - 4.0 K/uL  2.0  2.6      There is no height or weight on file to calculate BMI.  Orders:  No orders of the defined types were placed in this encounter.  No orders of the defined types were placed in this encounter.    Procedures: No procedures performed  Clinical Data: No additional findings.  ROS:  All other systems negative, except as noted in the HPI. Review of Systems  Objective: Vital Signs: LMP  (LMP Unknown)   Specialty Comments:  No specialty comments available.  PMFS History: Patient Active Problem List   Diagnosis Date Noted   Pain in right ankle and joints of right foot 04/18/2024   Current moderate episode of major depressive disorder without prior episode (HCC) 01/10/2023   BMI 33.0-33.9,adult 01/10/2023   Primary osteoarthritis of both knees 01/10/2023   Debility 10/05/2022   Gout attack  10/04/2022   UTI (urinary tract infection) 10/04/2022   Arthritis of left knee 08/21/2022   OA (osteoarthritis) of knee 08/19/2022   S/P TKR (total knee replacement), left 08/19/2022   Controlled type 2 diabetes mellitus without complication, without long-term current use of insulin  (HCC) 07/31/2021   Genetic testing 03/14/2020   Family history of pancreatic cancer    Family history of kidney cancer    Family history of lung cancer    Long term (current) use of insulin  (HCC) 11/01/2018   Chronic kidney disease due to hypertension 08/24/2018   Non-thrombocytopenic purpura 01/14/2016   Overactive bladder 01/14/2016   Obstructive sleep apnea (adult) (pediatric) 12/10/2009   Diabetic renal disease (HCC) 05/10/2009   Disorder of bone 05/10/2009   Gastro-esophageal reflux disease without esophagitis 05/10/2009   Past Medical History:  Diagnosis Date   Chronic kidney disease    Complication of anesthesia    hard time waking me up after foot surgery   Depression    Diabetes (HCC)    type 2   Edema    Family history of kidney cancer    Family history of lung cancer    Family history of pancreatic cancer    Osteoarthritis of both knees    Sleep apnea    years ago does not wear CPAP anymmore    Family History  Problem Relation Age of Onset   Diabetes Maternal Aunt    CVA Daughter    Kidney failure Son    Pancreatic cancer Father 38   Kidney cancer Half-Sister 77       non-smoker   Lung cancer Half-Sister 62       hx of smoking   Cancer Half-Sister 50       unknown primary    Past Surgical History:  Procedure Laterality Date   ABDOMINAL HYSTERECTOMY     FOOT SURGERY Left    TOTAL KNEE ARTHROPLASTY Left 08/19/2022   Procedure: LEFT TOTAL KNEE ARTHROPLASTY;  Surgeon: Addie Cordella Hamilton, MD;  Location: [facility] OR;  Service: Orthopedics;  Laterality: Left;   TUBAL LIGATION     Social History   Occupational History   Not on file  Tobacco Use   Smoking status: Never    Smokeless tobacco: Never  Vaping Use   Vaping status: Never Used  Substance and Sexual Activity   Alcohol  use: Never   Drug  use: Never   Sexual activity: Not on file

## 2024-08-03 ENCOUNTER — Encounter: Payer: Self-pay | Admitting: Physician Assistant
